# Patient Record
Sex: Female | Born: 1975 | Race: Black or African American | Hispanic: No | Smoking: Never smoker
Health system: Southern US, Community
[De-identification: ages and names within clinical notes are randomized; demographics above are authoritative.]

## PROBLEM LIST (undated history)

## (undated) DIAGNOSIS — E669 Obesity, unspecified: Secondary | ICD-10-CM

## (undated) DIAGNOSIS — I10 Essential (primary) hypertension: Secondary | ICD-10-CM

## (undated) DIAGNOSIS — T7840XA Allergy, unspecified, initial encounter: Secondary | ICD-10-CM

## (undated) DIAGNOSIS — H35039 Hypertensive retinopathy, unspecified eye: Secondary | ICD-10-CM

## (undated) DIAGNOSIS — D649 Anemia, unspecified: Secondary | ICD-10-CM

## (undated) HISTORY — DX: Allergy, unspecified, initial encounter: T78.40XA

## (undated) HISTORY — DX: Essential (primary) hypertension: I10

## (undated) HISTORY — DX: Hypertensive retinopathy, unspecified eye: H35.039

## (undated) HISTORY — DX: Obesity, unspecified: E66.9

---

## 2001-08-17 ENCOUNTER — Emergency Department (HOSPITAL_COMMUNITY): Admission: EM | Admit: 2001-08-17 | Discharge: 2001-08-17 | Payer: Self-pay | Admitting: Internal Medicine

## 2001-08-17 ENCOUNTER — Encounter: Payer: Self-pay | Admitting: Internal Medicine

## 2002-02-12 ENCOUNTER — Ambulatory Visit (HOSPITAL_COMMUNITY): Admission: AD | Admit: 2002-02-12 | Discharge: 2002-02-12 | Payer: Self-pay | Admitting: Obstetrics and Gynecology

## 2002-06-17 ENCOUNTER — Inpatient Hospital Stay (HOSPITAL_COMMUNITY): Admission: AD | Admit: 2002-06-17 | Discharge: 2002-06-20 | Payer: Self-pay | Admitting: Obstetrics & Gynecology

## 2003-10-08 ENCOUNTER — Emergency Department (HOSPITAL_COMMUNITY): Admission: EM | Admit: 2003-10-08 | Discharge: 2003-10-08 | Payer: Self-pay | Admitting: Emergency Medicine

## 2004-07-28 ENCOUNTER — Emergency Department (HOSPITAL_COMMUNITY): Admission: EM | Admit: 2004-07-28 | Discharge: 2004-07-28 | Payer: Self-pay | Admitting: Emergency Medicine

## 2004-10-03 ENCOUNTER — Emergency Department (HOSPITAL_COMMUNITY): Admission: EM | Admit: 2004-10-03 | Discharge: 2004-10-03 | Payer: Self-pay | Admitting: *Deleted

## 2005-02-19 ENCOUNTER — Emergency Department (HOSPITAL_COMMUNITY): Admission: EM | Admit: 2005-02-19 | Discharge: 2005-02-19 | Payer: Self-pay | Admitting: Family Medicine

## 2006-03-05 ENCOUNTER — Emergency Department (HOSPITAL_COMMUNITY): Admission: EM | Admit: 2006-03-05 | Discharge: 2006-03-05 | Payer: Self-pay | Admitting: Emergency Medicine

## 2006-08-05 ENCOUNTER — Ambulatory Visit: Payer: Self-pay | Admitting: Family Medicine

## 2006-08-06 ENCOUNTER — Encounter: Payer: Self-pay | Admitting: Family Medicine

## 2006-08-06 LAB — CONVERTED CEMR LAB
BUN: 7 mg/dL (ref 6–23)
Basophils Absolute: 0 10*3/uL (ref 0.0–0.1)
Basophils Relative: 0 % (ref 0–1)
CO2: 20 meq/L (ref 19–32)
Calcium: 9.2 mg/dL (ref 8.4–10.5)
Chloride: 105 meq/L (ref 96–112)
Cholesterol: 144 mg/dL (ref 0–200)
Creatinine, Ser: 0.74 mg/dL (ref 0.40–1.20)
Eosinophils Absolute: 0.1 10*3/uL (ref 0.0–0.7)
Eosinophils Relative: 1 % (ref 0–5)
Glucose, Bld: 93 mg/dL (ref 70–99)
HCT: 39.9 % (ref 36.0–46.0)
HDL: 53 mg/dL (ref >39)
Hemoglobin: 12.5 g/dL (ref 12.0–15.0)
LDL Cholesterol: 81 mg/dL (ref 0–99)
Lymphocytes Relative: 35 % (ref 12–46)
Lymphs Abs: 2.1 10*3/uL (ref 0.7–3.3)
MCHC: 31.3 g/dL (ref 30.0–36.0)
MCV: 89.7 fL (ref 78.0–100.0)
Monocytes Absolute: 0.4 10*3/uL (ref 0.2–0.7)
Monocytes Relative: 7 % (ref 3–11)
Neutro Abs: 3.5 10*3/uL (ref 1.7–7.7)
Neutrophils Relative %: 58 % (ref 43–77)
Platelets: 452 10*3/uL — ABNORMAL HIGH (ref 150–400)
Potassium: 4.5 meq/L (ref 3.5–5.3)
RBC: 4.45 M/uL (ref 3.87–5.11)
RDW: 12.9 % (ref 11.5–14.0)
Sodium: 139 meq/L (ref 135–145)
TSH: 1.769 microintl units/mL (ref 0.350–5.50)
Total CHOL/HDL Ratio: 2.7
Triglycerides: 48 mg/dL (ref <150)
VLDL: 10 mg/dL (ref 0–40)
WBC: 6 10*3/uL (ref 4.0–10.5)

## 2006-08-07 ENCOUNTER — Encounter: Payer: Self-pay | Admitting: Family Medicine

## 2006-08-07 LAB — CONVERTED CEMR LAB
Candida species: NEGATIVE
Chlamydia, DNA Probe: NEGATIVE
GC Probe Amp, Genital: NEGATIVE
Gardnerella vaginalis: POSITIVE — AB
Trichomonal Vaginitis: NEGATIVE

## 2006-08-30 ENCOUNTER — Ambulatory Visit: Payer: Self-pay | Admitting: Family Medicine

## 2007-01-12 ENCOUNTER — Ambulatory Visit: Payer: Self-pay | Admitting: Family Medicine

## 2007-01-12 ENCOUNTER — Other Ambulatory Visit: Admission: RE | Admit: 2007-01-12 | Discharge: 2007-01-12 | Payer: Self-pay | Admitting: Family Medicine

## 2007-01-12 ENCOUNTER — Encounter: Payer: Self-pay | Admitting: Family Medicine

## 2007-01-12 LAB — CONVERTED CEMR LAB: Pap Smear: NORMAL

## 2007-01-13 ENCOUNTER — Encounter: Payer: Self-pay | Admitting: Family Medicine

## 2007-01-13 LAB — CONVERTED CEMR LAB
Chlamydia, DNA Probe: NEGATIVE
GC Probe Amp, Genital: NEGATIVE

## 2007-01-14 ENCOUNTER — Encounter: Payer: Self-pay | Admitting: Family Medicine

## 2007-01-14 LAB — CONVERTED CEMR LAB
Candida species: NEGATIVE
Gardnerella vaginalis: POSITIVE — AB
Trichomonal Vaginitis: NEGATIVE

## 2007-01-28 ENCOUNTER — Ambulatory Visit (HOSPITAL_COMMUNITY): Admission: RE | Admit: 2007-01-28 | Discharge: 2007-01-28 | Payer: Self-pay | Admitting: Orthopaedic Surgery

## 2007-05-05 ENCOUNTER — Encounter: Payer: Self-pay | Admitting: Family Medicine

## 2007-10-12 ENCOUNTER — Ambulatory Visit: Payer: Self-pay | Admitting: Family Medicine

## 2007-10-12 LAB — CONVERTED CEMR LAB
Chlamydia, DNA Probe: NEGATIVE
GC Probe Amp, Genital: NEGATIVE

## 2007-10-13 ENCOUNTER — Encounter: Payer: Self-pay | Admitting: Family Medicine

## 2007-10-13 LAB — CONVERTED CEMR LAB
Candida species: NEGATIVE
Gardnerella vaginalis: POSITIVE — AB
Trichomonal Vaginitis: NEGATIVE

## 2007-10-17 ENCOUNTER — Encounter: Payer: Self-pay | Admitting: Family Medicine

## 2007-11-03 ENCOUNTER — Ambulatory Visit: Payer: Self-pay | Admitting: Family Medicine

## 2008-01-30 ENCOUNTER — Telehealth: Payer: Self-pay | Admitting: Family Medicine

## 2008-02-28 ENCOUNTER — Encounter: Payer: Self-pay | Admitting: Family Medicine

## 2008-03-08 ENCOUNTER — Encounter: Payer: Self-pay | Admitting: Family Medicine

## 2008-03-19 ENCOUNTER — Other Ambulatory Visit: Admission: RE | Admit: 2008-03-19 | Discharge: 2008-03-19 | Payer: Self-pay | Admitting: Family Medicine

## 2008-03-19 ENCOUNTER — Encounter: Payer: Self-pay | Admitting: Family Medicine

## 2008-03-19 ENCOUNTER — Ambulatory Visit: Payer: Self-pay | Admitting: Family Medicine

## 2008-03-20 ENCOUNTER — Encounter: Payer: Self-pay | Admitting: Family Medicine

## 2008-03-20 LAB — CONVERTED CEMR LAB
Candida species: NEGATIVE
Chlamydia, DNA Probe: POSITIVE — AB
GC Probe Amp, Genital: NEGATIVE
Gardnerella vaginalis: NEGATIVE
Trichomonal Vaginitis: NEGATIVE

## 2008-03-21 DIAGNOSIS — N760 Acute vaginitis: Secondary | ICD-10-CM | POA: Insufficient documentation

## 2008-03-26 ENCOUNTER — Encounter: Payer: Self-pay | Admitting: Family Medicine

## 2008-05-02 ENCOUNTER — Telehealth: Payer: Self-pay | Admitting: Family Medicine

## 2008-06-26 ENCOUNTER — Telehealth: Payer: Self-pay | Admitting: Family Medicine

## 2008-06-27 ENCOUNTER — Encounter: Payer: Self-pay | Admitting: Family Medicine

## 2008-07-19 ENCOUNTER — Ambulatory Visit: Payer: Self-pay | Admitting: Family Medicine

## 2008-07-20 ENCOUNTER — Encounter: Payer: Self-pay | Admitting: Family Medicine

## 2008-07-20 LAB — CONVERTED CEMR LAB
Candida species: NEGATIVE
Chlamydia, DNA Probe: NEGATIVE
GC Probe Amp, Genital: NEGATIVE
Gardnerella vaginalis: POSITIVE — AB
Trichomonal Vaginitis: NEGATIVE

## 2008-11-09 ENCOUNTER — Ambulatory Visit: Payer: Self-pay | Admitting: Family Medicine

## 2008-11-09 DIAGNOSIS — N938 Other specified abnormal uterine and vaginal bleeding: Secondary | ICD-10-CM | POA: Insufficient documentation

## 2008-11-09 DIAGNOSIS — N949 Unspecified condition associated with female genital organs and menstrual cycle: Secondary | ICD-10-CM

## 2008-11-09 LAB — CONVERTED CEMR LAB
Beta hcg, urine, semiquantitative: NEGATIVE
Chlamydia, DNA Probe: NEGATIVE
GC Probe Amp, Genital: NEGATIVE

## 2008-11-10 ENCOUNTER — Encounter: Payer: Self-pay | Admitting: Family Medicine

## 2008-11-12 LAB — CONVERTED CEMR LAB
Candida species: NEGATIVE
Gardnerella vaginalis: POSITIVE — AB

## 2009-01-17 ENCOUNTER — Ambulatory Visit: Payer: Self-pay | Admitting: Family Medicine

## 2009-01-17 DIAGNOSIS — R5383 Other fatigue: Secondary | ICD-10-CM

## 2009-01-17 DIAGNOSIS — R5381 Other malaise: Secondary | ICD-10-CM | POA: Insufficient documentation

## 2009-01-18 ENCOUNTER — Encounter: Payer: Self-pay | Admitting: Family Medicine

## 2009-01-18 LAB — CONVERTED CEMR LAB
Candida species: POSITIVE — AB
Chlamydia, DNA Probe: NEGATIVE
GC Probe Amp, Genital: NEGATIVE
Gardnerella vaginalis: NEGATIVE

## 2009-01-22 LAB — CONVERTED CEMR LAB
BUN: 8 mg/dL
Basophils Absolute: 0 10*3/uL
Basophils Relative: 0 %
CO2: 24 meq/L
Calcium: 9.1 mg/dL
Chloride: 102 meq/L
Cholesterol: 158 mg/dL
Creatinine, Ser: 0.68 mg/dL
Eosinophils Absolute: 0.1 10*3/uL
Eosinophils Relative: 1 %
Glucose, Bld: 86 mg/dL
HCT: 41.1 %
HDL: 71 mg/dL
Hemoglobin: 12.9 g/dL
LDL Cholesterol: 76 mg/dL
Lymphocytes Relative: 36 %
Lymphs Abs: 2.9 10*3/uL
MCHC: 31.4 g/dL
MCV: 89.2 fL
Monocytes Absolute: 0.5 10*3/uL
Monocytes Relative: 6 %
Neutro Abs: 4.5 10*3/uL
Neutrophils Relative %: 56 %
Platelets: 427 10*3/uL — ABNORMAL HIGH
Potassium: 4.3 meq/L
RBC: 4.61 M/uL
RDW: 13.2 %
Sodium: 139 meq/L
TSH: 1.949 u[IU]/mL
Total CHOL/HDL Ratio: 2.2
Triglycerides: 54 mg/dL
VLDL: 11 mg/dL
WBC: 8 10*3/uL

## 2009-02-14 ENCOUNTER — Ambulatory Visit: Payer: Self-pay | Admitting: Family Medicine

## 2009-02-14 LAB — CONVERTED CEMR LAB: Beta hcg, urine, semiquantitative: POSITIVE

## 2009-02-15 LAB — CONVERTED CEMR LAB: hCG, Beta Chain, Quant, S: 1403.8 milliintl units/mL

## 2009-02-16 DIAGNOSIS — N912 Amenorrhea, unspecified: Secondary | ICD-10-CM | POA: Insufficient documentation

## 2009-02-18 ENCOUNTER — Telehealth: Payer: Self-pay | Admitting: Family Medicine

## 2009-03-25 ENCOUNTER — Other Ambulatory Visit: Admission: RE | Admit: 2009-03-25 | Discharge: 2009-03-25 | Payer: Self-pay | Admitting: Obstetrics and Gynecology

## 2009-10-04 ENCOUNTER — Ambulatory Visit: Payer: Self-pay | Admitting: Advanced Practice Midwife

## 2009-10-04 ENCOUNTER — Inpatient Hospital Stay (HOSPITAL_COMMUNITY): Admission: AD | Admit: 2009-10-04 | Discharge: 2009-10-07 | Payer: Self-pay | Admitting: Obstetrics & Gynecology

## 2009-12-04 ENCOUNTER — Telehealth: Payer: Self-pay | Admitting: Family Medicine

## 2009-12-04 ENCOUNTER — Ambulatory Visit: Payer: Self-pay | Admitting: Family Medicine

## 2009-12-04 DIAGNOSIS — I10 Essential (primary) hypertension: Secondary | ICD-10-CM | POA: Insufficient documentation

## 2009-12-20 ENCOUNTER — Emergency Department (HOSPITAL_COMMUNITY): Admission: EM | Admit: 2009-12-20 | Discharge: 2009-12-20 | Payer: Self-pay | Admitting: Emergency Medicine

## 2010-03-21 ENCOUNTER — Ambulatory Visit: Payer: Self-pay | Admitting: Family Medicine

## 2010-06-03 NOTE — Progress Notes (Signed)
Summary: RX  Phone Note Call from Patient   Summary of Call: ON HER BIRTH CONTROL RX ONLY WAS TWO 2 REFILLS AND WANTS TO KNOW WHY NOT 12 MONTHS  U CAN SEND OVER FOR 12 MONTHS AT [facility] CALL BACK AT [phone] Initial call taken by: Lind Guest,  May 02, 2008 4:02 PM  Follow-up for Phone Call        Rx Called In Follow-up by: Worthy Keeler LPN,  May 02, 2008 4:56 PM      Prescriptions: ORTHO-NOVUM 7/7/7 (28) 0.5/0.75/1-35 MG-MCG TABS (NORETHIN-ETH ESTRAD TRIPHASIC) Take one tab by mouth once daily  #1 cycle x 11   Entered by:   Worthy Keeler LPN   Authorized by:   Syliva Overman MD   Signed by:   Worthy Keeler LPN on 25/42/7062   Method used:   Electronically to        [facility]* (retail)       [address]       [place], [place]  [address]       Ph: [phone]       Fax: [phone]   RxID:   [id number]

## 2010-06-03 NOTE — Letter (Signed)
Summary: 1st missed appoinment  1st missed appoinment   Imported By: Lind Guest 03/21/2010 14:40:22  _____________________________________________________________________  External Attachment:    Type:   Image     Comment:   External Document

## 2010-06-03 NOTE — Letter (Signed)
Summary: misc  misc   Imported By: Lind Guest 10/03/2009 08:12:46  _____________________________________________________________________  External Attachment:    Type:   Image     Comment:   External Document

## 2010-06-03 NOTE — Assessment & Plan Note (Signed)
Summary: PHY   Vital Signs:  Patient Profile:   35 Years Old Female Height:     64 inches (162.56 cm) Weight:      246 pounds BMI:     42.38 O2 Sat:      97 % Pulse rate:   99 / minute Pulse rhythm:   regular Resp:     16 per minute BP sitting:   120 / 88  (left arm)  Pt. in pain?   no  Vitals Entered By: Everitt Amber (March 19, 2008 10:08 AM)               Vision Screening: Left eye with correction: 20 / 15 Right eye with correction: 20 / 15 Both eyes with correction: 20 / 20  Color vision testing: normal      Vision Entered By: Everitt Amber (March 19, 2008 10:14 AM)   Chief Complaint:  Follow up and Physical.  History of Present Illness: Pt. reports that she is well. Her concerns are that of being obese and struggling with weight loss unsuccessfully, and the fact that she does not entirely trust her partener. She denies fever or chills. She denies depression or anxiety.    Current Allergies: No known allergies      Review of Systems  ENT      Denies earache, hoarseness, nasal congestion, sinus pressure, and sore throat.  CV      Denies chest pain or discomfort, palpitations, shortness of breath with exertion, and swelling of feet.  Resp      Denies cough, sputum productive, and wheezing.  GI      Denies abdominal pain, constipation, diarrhea, nausea, and vomiting.  GU      Complains of discharge.      Denies abnormal vaginal bleeding, nocturia, and urinary frequency.  MS      Denies joint pain, low back pain, mid back pain, and stiffness.  Psych      Denies anxiety, depression, irritability, and suicidal thoughts/plans.  Endo      Denies cold intolerance, excessive hunger, excessive thirst, excessive urination, heat intolerance, polyuria, and weight change.  Heme      Denies abnormal bruising, bleeding, enlarge lymph nodes, fevers, pallor, and skin discoloration.  Allergy      Complains of seasonal allergies.   Physical Exam   General:     morbidly obese Head:     Normocephalic and atraumatic without obvious abnormalities. No apparent alopecia or balding. Eyes:     vision grossly intact, pupils equal, pupils round, and pupils reactive to light.   Ears:     External ear exam shows no significant lesions or deformities.  Otoscopic examination reveals clear canals, tympanic membranes are intact bilaterally without bulging, retraction, inflammation or discharge. Hearing is grossly normal bilaterally. Nose:     External nasal examination shows no deformity or inflammation. Nasal mucosa are pink and moist without lesions or exudates. Mouth:     Oral mucosa and oropharynx without lesions or exudates.  Teeth in good repair. Neck:     No deformities, masses, or tenderness noted. Chest Wall:     No deformities, masses, or tenderness noted. Breasts:     No mass, nodules, thickening, tenderness, bulging, retraction, inflamation, nipple discharge or skin changes noted.   Lungs:     Normal respiratory effort, chest expands symmetrically. Lungs are clear to auscultation, no crackles or wheezes. Heart:     Normal rate and regular rhythm. S1 and S2 normal without  gallop, murmur, click, rub or other extra sounds. Abdomen:     Bowel sounds positive,abdomen soft and non-tender without masses, organomegaly or hernias noted. Genitalia:     Normal introitus for age, no external lesions, no vaginal discharge, mucosa pink and moist,positive vaginal or cervical lesions, no vaginal atrophy, no friaility or hemorrhage, normal uterus size and position, no adnexal masses or tenderness Msk:     No deformity or scoliosis noted of thoracic or lumbar spine.   Pulses:     R and L carotid,radial,femoral,dorsalis pedis and posterior tibial pulses are full and equal bilaterally Extremities:     No clubbing, cyanosis, edema, or deformity noted with normal full range of motion of all joints.   Neurologic:     No cranial nerve deficits noted.  Station and gait are normal. Plantar reflexes are down-going bilaterally. DTRs are symmetrical throughout. Sensory, motor and coordinative functions appear intact. Skin:     Intact without suspicious lesions or rashes Cervical Nodes:     No lymphadenopathy noted Axillary Nodes:     No palpable lymphadenopathy Inguinal Nodes:     No significant adenopathy Psych:     Cognition and judgment appear intact. Alert and cooperative with normal attention span and concentration. No apparent delusions, illusions, hallucinations    Impression & Recommendations:  Problem # 1:  WELL WOMAN (ICD-V70.0) Assessment: Comment Only pap sent. Safe sex habits and consistent use of seat belts was discussed  Problem # 2:  OBESITY (ICD-278.00) Assessment: Unchanged  Orders: T-Basic Metabolic Panel [phone]) T-Lipid Profile [phone]) Pt counselled for 7 mins re necessary lifestyle changes to facilitate weight loss. No interest now in apetite suppressant    Problem # 3:  VAGINITIS&VULVOVAGINITIS DISEASES CLASS ELSW (ONG-295.28) Assessment: Comment Only specimen sent for wet prep and culture  Complete Medication List: 1)  Ortho-novum 7/7/7 (28) 0.5/0.75/1-35 Mg-mcg Tabs (Norethin-eth estrad triphasic) .... Take one tab by mouth once daily 2)  Doxycycline Hyclate 100 Mg Caps (Doxycycline hyclate) .... One tab by mouth bid  Other Orders: Pap Smear ([id number]) T-CBC w/Diff ([id number]) T-TSH ([id number])   Patient Instructions: 1)  Please schedule a follow-up appointment in 3 months. 2)  It is important that you exercise regularly at least 20 minutes 5 times a week. If you develop chest pain, have severe difficulty breathing, or feel very tired , stop exercising immediately and seek medical attention. You need to start going to the [facility] at leasst 5 days per week. 3)  You need to lose weight. Consider a lower calorie diet and regular exercise.  4)  Target 10 pound weight loss. 5)  BMP prior to  visit, ICD-9: 6)  Lipid Panel prior to visit, ICD-9:  as soon as possible 7)  TSH prior to visit, ICD-9: 8)  CBC w/ Diff prior to visit, ICD-9:   ]

## 2010-06-03 NOTE — Assessment & Plan Note (Signed)
Summary: office visit   Vital Signs:  Patient profile:   35 year old female Menstrual status:  regular Height:      64 inches Weight:      256.38 pounds BMI:     44.17 Pulse rate:   90 / minute Pulse rhythm:   regular Resp:     16 per minute BP sitting:   120 / 70  (left arm)  Vitals Entered By: Worthy Keeler LPN (February 14, 2009 9:32 AM) CC: positive home pregnancy test Is Patient Diabetic? No Pain Assessment Patient in pain? no        CC:  positive home pregnancy test.  History of Present Illness: Pt had her LMP August 2010, middle, she was reportedly on orthonovum and still conceived. Huntley Dec she is happy about her pregnacy. She denies nausea or vomitting. she ahs discontinued all meds including phentermine when she found out that shewas pregnant, I told her this was great, butthat she needas to follow a heaklthy diet rich in fruit and veg to prevent excessive weight gain and facilitate a healthy pregnancy.  Current Medications (verified): 1)  None  Allergies: No Known Drug Allergies  Review of Systems      See HPI General:  Denies chills and fever. ENT:  Denies earache, hoarseness, nasal congestion, and sinus pressure. CV:  Denies chest pain or discomfort, palpitations, and swelling of feet. Resp:  Denies cough, sputum productive, and wheezing. GI:  Denies abdominal pain, constipation, diarrhea, indigestion, nausea, and vomiting. GU:  Denies discharge, dysuria, and urinary frequency. MS:  Denies joint pain and stiffness. Psych:  Denies anxiety and depression.  Physical Exam  General:  alert, well-hydrated, and overweight-appearing.  HEENT: No facial asymmetry,  EOMI, No sinus tenderness, TM's Clear, oropharynx  pink and moist.   Chest: Clear to auscultation bilaterally.  CVS: S1, S2, No murmurs, No S3.   Abd: Soft, Nontender.  MS: Adequate ROM spine, hips, shoulders and knees.  Ext: No edema.   CNS: CN 2-12 intact, power tone and sensation normal  throughout.   Skin: Intact, no visible lesions or rashes.  Psych: Good eye contact, normal affect.  Memory intact, not anxious or depressed appearing. pelvic: cream maodoros discharge, pt also on menses at the time of exam    Impression & Recommendations:  Problem # 1:  AMENORRHEA (ICD-626.0) Assessment Comment Only  The following medications were removed from the medication list:    Ortho-novum 7/7/7 (28) 0.5/0.75/1-35 Mg-mcg Tabs (Norethin-eth estrad triphasic) .Marland Kitchen... Take one tab by mouth once daily  Orders: Urine Pregnancy Test  (81025)positive T-Pregnancy (Serum), Quant. [phone]) Obstetric Referral (Obstetric)  Problem # 2:  OBESITY (ICD-278.00) Assessment: Comment Only  Orders: Obstetric Referral (Obstetric)  Ht: 64 (02/14/2009)   Wt: 256.38 (02/14/2009)   BMI: 44.17 (02/14/2009)  Complete Medication List: 1)  Prenatal Vitamins 0.8 Mg Tabs (Prenatal multivit-min-fe-fa) .... Take 1 tablet by mouth once a day  Other Orders: T-RPR (Syphilis) ([id number]) T-HIV Antibody  (Reflex) ([id number])  Patient Instructions: 1)  You will be referred to [facility] asap . 2)  Quantitative HCG today , 3)  Pls start one prenatal vitamion daily. 4)  It is important that you exercise regularly at least 30 minutes 6 times a week. If you develop chest pain, have severe difficulty breathing, or feel very tired , stop exercising immediately and seek medical attention. 5)  You need to lose weight. Consider a lower calorie diet and regular exercise.  6)  Cobngrats, and all the Best!! Prescriptions:  PRENATAL VITAMINS 0.8 MG TABS (PRENATAL MULTIVIT-MIN-FE-FA) Take 1 tablet by mouth once a day  #30 x 10   Entered and Authorized by:   Syliva Overman MD   Signed by:   Syliva Overman MD on 02/16/2009   Method used:   Handwritten   RxID:   [id number]   Laboratory Results   Urine Tests  Date/Time Received: 02/14/09 9:45am Date/Time Reported: 02/14/09 9:45am    Urine HCG:  positive

## 2010-06-03 NOTE — Assessment & Plan Note (Signed)
Summary: phy   Vital Signs:  Patient Profile:   35 Years Old Female Height:     64 inches (162.56 cm) Weight:      246 pounds (111.82 kg) BMI:     42.38 BSA:     2.14 O2 Sat:      97 % Pulse rate:   103 / minute Resp:     16 per minute BP sitting:   120 / 90  (left arm)  Pt. in pain?   no  Vitals Entered By: Everitt Amber (February 28, 2008 3:03 PM)               Vision Screening: Left eye with correction: 20 / 15 Right eye with correction: 20 / 20 Both eyes with correction: 20 / 15  Color vision testing: normal      Vision Entered By: Everitt Amber (February 28, 2008 3:08 PM)   Chief Complaint:  Follow up and Physical.  History of Present Illness: Pt is essentially doing well and her only health concern is her weight.She is not diligent in weight loss attempts and therefore hs had no success with this. She denies depression or anxiety. She has had no recent fever or chills.    Updated Prior Medication List: No Medications Current Allergies: No known allergies           ]Pt. rescheduled secondary to being on her menses  Appended Document: phy please cancel charge for this visit, the pt resched she was on her menses, she did not have an exam on this date  Appended Document: phy charge has been removed

## 2010-06-03 NOTE — Letter (Signed)
Summary: office notes  office notes   Imported By: Lind Guest 10/03/2009 08:15:36  _____________________________________________________________________  External Attachment:    Type:   Image     Comment:   External Document

## 2010-06-03 NOTE — Assessment & Plan Note (Signed)
Summary: OV   Vital Signs:  Patient profile:   35 year old female Menstrual status:  regular Height:      64 inches (162.56 cm) Weight:      247 pounds (112.27 kg) BMI:     42.55 BSA:     2.14 O2 Sat:      96 % Pulse rate:   100 / minute Resp:     16 per minute BP sitting:   120 / 90  (left arm)  Vitals Entered By: Everitt Amber (July 19, 2008 10:36 AM)  Nutrition Counseling: Patient's BMI is greater than 25 and therefore counseled on weight management options. CC: Follow up  years   days  Menstrual Status regular Last PAP Result NEGATIVE FOR INTRAEPITHELIAL LESIONS OR MALIGNANCY.   CC:  Follow up.  History of Present Illness: 2 week h/o increased vaginal discharge. wants to be rechecked  for chlamydia, does not think her partener was treated.she denies dyspareunia or post coital bleed. The pt is also concerned about her inability to lose weight, she is inconsitent in lifestyle change neede, and is requesting help with an apetite suppressant. she denies depreeion, anxiety or insomnia.  Preventive Screening-Counseling & Management     Smoking Status: never  Allergies (verified): No Known Drug Allergies  Review of Systems General:  Denies chills and fever. Eyes:  Denies blurring, discharge, and itching. ENT:  Denies earache, hoarseness, nasal congestion, and sinus pressure. CV:  Denies chest pain or discomfort, palpitations, and swelling of feet. Resp:  Denies cough, sputum productive, and wheezing. GI:  Denies abdominal pain, constipation, diarrhea, indigestion, nausea, and vomiting. GU:  See HPI. MS:  Denies joint pain and stiffness. Derm:  Denies itching, lesion(s), and rash. Psych:  Denies anxiety and depression. Endo:  Denies cold intolerance, excessive hunger, excessive thirst, excessive urination, heat intolerance, polyuria, and weight change.  Physical Exam  General:  alert, well-hydrated, and overweight-appearing. HEENT: No facial asymmetry,  EOMI, No  sinus tenderness, TM's Clear, oropharynx  pink and moist.   Chest: Clear to auscultation bilaterally.  CVS: S1, S2, No murmurs, No S3.   Abd: Soft, Nontender.  MS: Adequate ROM spine, hips, shoulders and knees.  Ext: No edema.   CNS: CN 2-12 intact, power tone and sensation normal throughout.   Skin: Intact, no visible lesions or rashes.  Psych: Good eye contact, normal affect.  Memory intact, not anxious or depressed appearing. Pelvic: Malosoros vag d/c , no cervical motion or adnexal tenderness, uterus nl sized.     Impression & Recommendations:  Problem # 1:  VAGINITIS&VULVOVAGINITIS DISEASES CLASS ELSW (ZOX-096.04) Assessment Comment Only  Orders: T-Wet Prep by Molecular Probe [phone]) T-Chlamydia & GC Probe, Genital ([id number])  Problem # 2:  OBESITY (ICD-278.00) Assessment: Deteriorated start phentermine half daily  Complete Medication List: 1)  Ortho-novum 7/7/7 (28) 0.5/0.75/1-35 Mg-mcg Tabs (Norethin-eth estrad triphasic) .... Take one tab by mouth once daily 2)  Doxycycline Hyclate 100 Mg Caps (Doxycycline hyclate) .... One tab by mouth bid 3)  Phentermine Hcl 37.5 Mg Tabs (Phentermine hcl) .... Take 1 tablet by mouth once a day 4)  Metronidazole 500 Mg Tabs (Metronidazole) .... One tab by mouth bid  Patient Instructions: 1)  Please schedule a follow-up appointment in 2 months. 2)  It is important that you exercise regularly at least 40 minutes 6 times a week. If you develop chest pain, have severe difficulty breathing, or feel very tired , stop exercising immediately and seek medical attention. 3)  You  need to lose weight. Consider a lower calorie diet and regular exercise.  4)  medication issent in to assist with weight loss. 5)  specimens  are being sent for testing. Prescriptions: PHENTERMINE HCL 37.5 MG TABS (PHENTERMINE HCL) Take 1 tablet by mouth once a day  #30 x 0   Entered and Authorized by:   Syliva Overman MD   Signed by:   Syliva Overman  MD on 07/19/2008   Method used:   Printed then faxed to ...       [facility]* (retail)       [address]       [place], [place]  [address]       Ph: [phone]       Fax: [phone]   RxID:   [id number]

## 2010-06-03 NOTE — Miscellaneous (Signed)
Summary: refill  Clinical Lists Changes  Medications: Rx of ORTHO-NOVUM 7/7/7 (28) 0.5/0.75/1-35 MG-MCG TABS (NORETHIN-ETH ESTRAD TRIPHASIC) Take one tab by mouth once daily;  #1 month x 2;  Signed;  Entered by: Worthy Keeler LPN;  Authorized by: Syliva Overman MD;  Method used: Electronically to [facility]*, [address], [place], [place], [place]  [address], Ph: [phone], Fax: [phone]    Prescriptions: ORTHO-NOVUM 7/7/7 (28) 0.5/0.75/1-35 MG-MCG TABS (NORETHIN-ETH ESTRAD TRIPHASIC) Take one tab by mouth once daily  #1 month x 2   Entered by:   Worthy Keeler LPN   Authorized by:   Syliva Overman MD   Signed by:   Worthy Keeler LPN on 24/40/1027   Method used:   Electronically to        [facility]* (retail)       [address]       [place], [place]  [address]       Ph: [phone]       Fax: [phone]   RxID:   [id number]

## 2010-06-03 NOTE — Progress Notes (Signed)
Summary: NEEDS A REFERRALL  Phone Note Call from Patient   Summary of Call: NEEDS A  REFFERRAL TO DR. Weldon Inches DR. COUSINS NEED [phone]  CALL BACK AT [phone] Initial call taken by: Lind Guest,  February 18, 2009 2:38 PM  Follow-up for Phone Call        pls refer pt to dr. Beola Cord for pregnancy and send quant hCG result as well as her hIV test she recently had done , pls let her know  you are working on it/pass on if unable to do today  Follow-up by: Syliva Overman MD,  February 18, 2009 4:20 PM  Additional Follow-up for Phone Call Additional follow up Details #1::        Phone Call Completed Additional Follow-up by: Worthy Keeler LPN,  February 18, 2009 5:08 PM

## 2010-06-03 NOTE — Letter (Signed)
Summary: phone notes  phone notes   Imported By: Lind Guest 10/03/2009 08:13:52  _____________________________________________________________________  External Attachment:    Type:   Image     Comment:   External Document

## 2010-06-03 NOTE — Progress Notes (Signed)
Summary: please advise  Phone Note Call from Patient   Summary of Call: patient called in and wanted to know about prescription, she states the one that was called in is no longer avaliable.  Please advise. Initial call taken by: Curtis Sites,  December 04, 2009 1:49 PM    Prescriptions: TRIAMTERENE-HCTZ 37.5-25 MG TABS (TRIAMTERENE-HCTZ) Take 1 tablet by mouth once a day  #90 x 0   Entered by:   Everitt Amber LPN   Authorized by:   Syliva Overman MD   Signed by:   Everitt Amber LPN on 16/02/9603   Method used:   Electronically to        [facility]* (retail)       [address]       [address] Shipping/mailing       [place], [place]  [address]       Ph: [phone]       Fax: [phone]   RxID:   [id number]  The original strength wasn't available so 37.5/25 was sent in. Patient called and said that she was in [place] and to resend it to [place] [facility] out pt.

## 2010-06-03 NOTE — Letter (Signed)
Summary: labs  labs   Imported By: Lind Guest 10/03/2009 08:12:16  _____________________________________________________________________  External Attachment:    Type:   Image     Comment:   External Document

## 2010-06-03 NOTE — Assessment & Plan Note (Signed)
Summary: office visit   Vital Signs:  Patient profile:   35 year old female Menstrual status:  regular Height:      64 inches Weight:      241.50 pounds BMI:     41.60 O2 Sat:      98 % Pulse rate:   103 / minute Pulse rhythm:   regular Resp:     16 per minute BP sitting:   160 / 110  (left arm) Cuff size:   large  Vitals Entered By: Everitt Amber LPN (December 04, 2009 10:51 AM)  Nutrition Counseling: Patient's BMI is greater than 25 and therefore counseled on weight management options. CC: BP still high after pregnancy   CC:  BP still high after pregnancy.  History of Present Illness: Reports  thatshe has been oing well. Since her last visit, she developed a healthy infant girl who is now 21 months old. the pt had HTN post partum, felt to be pregnancy relate, but this has persisted,a nd she states she does not feel wellin the past 2 days, abit light headed Denies recent fever or chills. Denies sinus pressure, nasal congestion , ear pain or sore throat. Denies chest congestion, or cough productive of sputum. Denies chest pain, palpitations, PND, orthopnea or leg swelling. Denies abdominal pain, nausea, vomitting, diarrhea or constipation. Denies change in bowel movements or bloody stool. Denies dysuria , frequency, incontinence or hesitancy. Denies  joint pain, swelling, or reduced mobility. Denies headaches, vertigo, seizures. Denies depression, anxiety or insomnia. Denies  rash, lesions, or itch.     Current Medications (verified): 1)  None  Allergies (verified): No Known Drug Allergies  Past History:  Past medical, surgical, family and social histories (including risk factors) reviewed, and no changes noted (except as noted below).  Past Medical History:   OBESITY (ICD-278.00) hTN 2011  Past Surgical History: Reviewed history from 10/17/2007 and no changes required. Denies surgical history  Family History: Reviewed history from 10/17/2007 and no changes  required. Father: HTN,DM Mother: HTN Siblings: one brother, tobbacco abuse  Social History: Reviewed history from 10/17/2007 and no changes required. Marital Status: Single  Children: two ages 2 months and 7 years (12/2009) Occupation: CNA Never Smoked Alcohol use-no Drug use-no  Review of Systems      See HPI General:  Complains of fatigue. Eyes:  Denies blurring, discharge, eye pain, and red eye. Neuro:  Denies headaches and seizures. Endo:  Denies cold intolerance, excessive hunger, excessive thirst, excessive urination, heat intolerance, polyuria, and weight change. Heme:  Denies abnormal bruising and bleeding. Allergy:  Denies hives or rash and itching eyes.  Physical Exam  General:  Well-developed,obese,in no acute distress; alert,appropriate and cooperative throughout examination HEENT: No facial asymmetry,  EOMI, No sinus tenderness, TM's Clear, oropharynx  pink and moist.   Chest: Clear to auscultation bilaterally.  CVS: S1, S2, No murmurs, No S3.   Abd: Soft, Nontender.  MS: Adequate ROM spine, hips, shoulders and knees.  Ext: No edema.   CNS: CN 2-12 intact, power tone and sensation normal throughout.   Skin: Intact, no visible lesions or rashes.  Psych: Good eye contact, normal affect.  Memory intact, not anxious or depressed appearing.    Impression & Recommendations:  Problem # 1:  ESSENTIAL HYPERTENSION (ICD-401.9) Assessment Deteriorated  Her updated medication list for this problem includes:    Triamterene-hctz 50-25 Mg Caps (Triamterene-hctz) .Marland Kitchen... Take 1 capsule by mouth once a day    Triamterene-hctz 37.5-25 Mg Tabs (Triamterene-hctz) .Marland KitchenMarland KitchenMarland KitchenMarland Kitchen  Take 1 tablet by mouth once a day  BP today: 160/110 Prior BP: 120/70 (02/14/2009)  Labs Reviewed: K+: 4.3 (01/18/2009) Creat: : 0.68 (01/18/2009)   Chol: 158 (01/18/2009)   HDL: 71 (01/18/2009)   LDL: 76 (01/18/2009)   TG: 54 (01/18/2009)  Problem # 2:  OBESITY (ICD-278.00) Assessment: Improved  Ht: 64  (12/04/2009)   Wt: 241.50 (12/04/2009)   BMI: 41.60 (12/04/2009)  Complete Medication List: 1)  Triamterene-hctz 50-25 Mg Caps (Triamterene-hctz) .... Take 1 capsule by mouth once a day 2)  Triamterene-hctz 37.5-25 Mg Tabs (Triamterene-hctz) .... Take 1 tablet by mouth once a day  Other Orders: T-Basic Metabolic Panel [id number])  Patient Instructions: 1)  Please schedule a follow-up appointment in 1 month. 2)  It is important that you exercise regularly at least 30 minutes 5 times a week. If you develop chest pain, have severe difficulty breathing, or feel very tired , stop exercising immediately and seek medical attention. 3)  You need to lose weight. Consider a lower calorie diet and regular exercise.  4)  pLS follow the DASH diet. 5)  Med is to be started today 6)  BMP prior to visit, ICD-9: fasting in 4 weeks Prescriptions: TRIAMTERENE-HCTZ 37.5-25 MG TABS (TRIAMTERENE-HCTZ) Take 1 tablet by mouth once a day  #30 x 2   Entered by:   Everitt Amber LPN   Authorized by:   Syliva Overman MD   Signed by:   Everitt Amber LPN on 09/81/1914   Method used:   Electronically to        [facility]* (retail)       [address] [place], [place]  [address]       Ph: [phone]       Fax: [phone]   RxID:   [id number] TRIAMTERENE-HCTZ 50-25 MG CAPS (TRIAMTERENE-HCTZ) Take 1 capsule by mouth once a day  #90 x 0   Entered and Authorized by:   Syliva Overman MD   Signed by:   Syliva Overman MD on 12/04/2009   Method used:   Electronically to        [facility]* (retail)       [address]       [address] Shipping/mailing       [place], [place]  [address]       Ph: [phone]       Fax: [phone]   RxID:   [id number] TRIAMTERENE-HCTZ 50-25 MG CAPS (TRIAMTERENE-HCTZ) Take 1 capsule by mouth once a day  #30 x 2   Entered and Authorized by:   Syliva Overman MD   Signed by:   Syliva Overman MD on  12/04/2009   Method used:   Electronically to        [facility]* (retail)       [address]       [place], [place]  [address]       Ph: [phone]       Fax: [phone]   RxID:   [id number]

## 2010-06-03 NOTE — Progress Notes (Signed)
Summary: support hose  Phone Note Call from Patient   Summary of Call: has really bad veins in legs and thigh. would like to get some support hose. [facility] [phone] Initial call taken by: Rudene Anda,  June 26, 2008 9:27 AM  Follow-up for Phone Call        advise and fax script for medium compression hose 15mm Follow-up by: Syliva Overman MD,  June 26, 2008 4:03 PM  Additional Follow-up for Phone Call Additional follow up Details #1::        order sent to [place], called patient, no answer Additional Follow-up by: Worthy Keeler LPN,  June 26, 2008 4:29 PM    Additional Follow-up for Phone Call Additional follow up Details #2::    called patient, left message Follow-up by: Worthy Keeler LPN,  June 27, 2008 10:29 AM

## 2010-06-03 NOTE — Medication Information (Signed)
Summary: Tax adviser   Imported By: Lind Guest 06/27/2008 13:42:24  _____________________________________________________________________  External Attachment:    Type:   Image     Comment:   External Document

## 2010-06-03 NOTE — Assessment & Plan Note (Signed)
Summary: OV   Vital Signs:  Patient profile:   35 year old female Menstrual status:  regular LMP:     10/21/2008 Height:      64 inches Weight:      253.19 pounds BMI:     43.62 Pulse rate:   104 / minute Pulse rhythm:   regular Resp:     16 per minute BP sitting:   112 / 80  (left arm)  Vitals Entered By: Worthy Keeler LPN (November 09, 9145 10:50 AM)  Nutrition Counseling: Patient's BMI is greater than 25 and therefore counseled on weight management options. CC: follow-up visit Is Patient Diabetic? No Pain Assessment Patient in pain? no      LMP (date): 10/21/2008  years   days  Enter LMP: 10/21/2008 Last PAP Result NEGATIVE FOR INTRAEPITHELIAL LESIONS OR MALIGNANCY.   CC:  follow-up visit.  History of Present Illness: 1 week h/o malodoros vaginal discharge. She also reports unprotected intercourse when she ran out of her pills, does not desire pregnancy, and wants to know when can she resume a new pack. She didi not take the phentermine out of fear,she has not changed her habits for the better to a state where she    has started losing weight, shehas actually unfortunately gained weightsince her last visit.  Current Medications (verified): 1)  Ortho-Novum 7/7/7 (28) 0.5/0.75/1-35 Mg-Mcg Tabs (Norethin-Eth Estrad Triphasic) .... Take One Tab By Mouth Once Daily  Allergies (verified): No Known Drug Allergies  Review of Systems      See HPI General:  Denies chills and fever. ENT:  Denies earache, hoarseness, nasal congestion, and sinus pressure. CV:  Denies chest pain or discomfort, difficulty breathing at night, difficulty breathing while lying down, palpitations, and shortness of breath with exertion. Resp:  Denies cough and sputum productive. GI:  Denies abdominal pain, constipation, diarrhea, nausea, and vomiting. GU:  Complains of discharge; 1 week h/o inc vag discharge, lMP 6/16 out of pills for 2 weks, . MS:  Denies joint pain and stiffness.  Physical Exam   General:  alert, well-hydrated, and overweight-appearing.  HEENT: No facial asymmetry,  EOMI, No sinus tenderness, TM's Clear, oropharynx  pink and moist.   Chest: Clear to auscultation bilaterally.  CVS: S1, S2, No murmurs, No S3.   Abd: Soft, Nontender.  MS: Adequate ROM spine, hips, shoulders and knees.  Ext: No edema.   CNS: CN 2-12 intact, power tone and sensation normal throughout.   Skin: Intact, no visible lesions or rashes.  Psych: Good eye contact, normal affect.  Memory intact, not anxious or depressed appearing. gU; positive vaginal d/c malodoros    Impression & Recommendations:  Problem # 1:  VAGINITIS&VULVOVAGINITIS DISEASES CLASS ELSW (WGN-562.13) Assessment Comment Only  Orders: T-Wet Prep by Molecular Probe [phone]) T-Chlamydia & GC Probe, Genital ([id number]) will treat based on results  Problem # 2:  OBESITY (ICD-278.00) Assessment: Deteriorated  Ht: 64 (11/09/2008)   Wt: 253.19 (11/09/2008)   BMI: 43.62 (11/09/2008), encouraged pt to stick to a lifestyle change that would facilitate weightloss, and also to reconsider using phentermine half daily to assist her in this, the adverse side effects of the drug were discussed   Problem # 3:  OTH D/O MENSTRUATION&OTH ABN BLEED FE GNT TRACT (ICD-626.8) Assessment: Comment Only pthad a negative pregnancy test in theoffivce. She is given provera challenge and as soon as she starts bleeding , she is to resume her regular oCP as before and discontinue the provera. theimpt of  regularly taking the pill to prevent conception was re-iterated  Complete Medication List: 1)  Ortho-novum 7/7/7 (28) 0.5/0.75/1-35 Mg-mcg Tabs (Norethin-eth estrad triphasic) .... Take one tab by mouth once daily 2)  Provera 5 Mg Tabs (Medroxyprogesterone acetate) .... One tab by mouth bid  Other Orders: Urine Pregnancy Test  (29528)  Patient Instructions: 1)  cPE nov 16 or after 11/16 or after. 2)  It is important that you exercise  regularly at least 20 minutes 5 times a week. If you develop chest pain, have severe difficulty breathing, or feel very tired , stop exercising immediately and seek medical attention. 3)  You need to lose weight. Consider a lower calorie diet and regular exercise.  Prescriptions: PROVERA 5 MG TABS (MEDROXYPROGESTERONE ACETATE) one tab by mouth bid  #10 x 0   Entered by:   Worthy Keeler LPN   Authorized by:   Syliva Overman MD   Signed by:   Worthy Keeler LPN on 41/32/4401   Method used:   Electronically to        [facility]* (retail)       [address] [place], [place]  [address]       Ph: [phone]       Fax: [phone]   RxID:   [id number]   Laboratory Results   Urine Tests  Date/Time Received: 11/09/08 11am Date/Time Reported: 11/09/08 11am    Urine HCG: negative    Appended Document: OV 2 levels havebeen submitted, pls bill at a level 4 only  Appended Document: OV this will be changed

## 2010-06-03 NOTE — Letter (Signed)
Summary: demo  demo   Imported By: Lind Guest 10/03/2009 08:10:56  _____________________________________________________________________  External Attachment:    Type:   Image     Comment:   External Document

## 2010-06-03 NOTE — Letter (Signed)
Summary: [facility]  [facility]   Imported By: Lind Guest 03/26/2008 14:02:37  _____________________________________________________________________  External Attachment:    Type:   Image     Comment:   External Document

## 2010-06-03 NOTE — Letter (Signed)
Summary: consults  consults   Imported By: Lind Guest 10/03/2009 08:14:35  _____________________________________________________________________  External Attachment:    Type:   Image     Comment:   External Document

## 2010-06-03 NOTE — Letter (Signed)
Summary: external records  external records   Imported By: Curtis Sites 12/24/2009 08:49:39  _____________________________________________________________________  External Attachment:    Type:   Image     Comment:   External Document

## 2010-06-03 NOTE — Progress Notes (Signed)
Summary: medicine  Phone Note Call from Patient   Summary of Call: needs a sample pack birth control pills [phone] Initial call taken by: Rudene Anda,  January 30, 2008 4:54 PM  Follow-up for Phone Call        Phone Call Completed Follow-up by: Worthy Keeler LPN,  January 31, 2008 11:43 AM

## 2010-06-03 NOTE — Assessment & Plan Note (Signed)
Summary: office visit   Vital Signs:  Patient profile:   35 year old female Menstrual status:  regular Height:      64 inches Weight:      254 pounds BMI:     43.76 O2 Sat:      96 % Pulse rate:   112 / minute Pulse rhythm:   regular Resp:     16 per minute BP sitting:   120 / 80  (left arm) Cuff size:   large  Vitals Entered By: Everitt Amber (January 17, 2009 11:16 AM) CC: Follow up visit Is Patient Diabetic? No Pain Assessment Patient in pain? no        CC:  Follow up visit.  History of Present Illness: Pt wants  to start the phentermine, she is making noo progress with lifestyle change for weight loss and states she needs help. Patient reports doing well.  Denies any recent fever or chills.  Denies any appetite change or change in bowel movements. Patient denies depression, anxiety or insomnia. She has a 3 day h/o thick nalododors vag d/c which she wants checked.   Current Medications (verified): 1)  Ortho-Novum 7/7/7 (28) 0.5/0.75/1-35 Mg-Mcg Tabs (Norethin-Eth Estrad Triphasic) .... Take One Tab By Mouth Once Daily  Allergies (verified): No Known Drug Allergies  Review of Systems      See HPI General:  Complains of fatigue; denies chills and fever. Eyes:  Denies blurring and discharge. ENT:  Denies earache, hoarseness, nasal congestion, postnasal drainage, sinus pressure, and sore throat. CV:  Denies chest pain or discomfort, difficulty breathing while lying down, fainting, fatigue, lightheadness, palpitations, and swelling of feet. Resp:  Denies cough and sputum productive. GI:  Denies abdominal pain, constipation, diarrhea, nausea, and vomiting. GU:  Complains of discharge; denies dysuria, incontinence, and urinary frequency. MS:  Denies joint pain and stiffness. Derm:  Denies itching and rash. Neuro:  Denies headaches and memory loss. Psych:  Denies anxiety and depression. Endo:  Denies cold intolerance, excessive hunger, excessive thirst, excessive  urination, heat intolerance, polyuria, and weight change. Heme:  Denies abnormal bruising and bleeding. Allergy:  Denies hives or rash and itching eyes.  Physical Exam  General:  alert, well-hydrated, and overweight-appearing.  HEENT: No facial asymmetry,  EOMI, No sinus tenderness, TM's Clear, oropharynx  pink and moist.   Chest: Clear to auscultation bilaterally.  CVS: S1, S2, No murmurs, No S3.   Abd: Soft, Nontender.  MS: Adequate ROM spine, hips, shoulders and knees.  Ext: No edema.   CNS: CN 2-12 intact, power tone and sensation normal throughout.   Skin: Intact, no visible lesions or rashes.  Psych: Good eye contact, normal affect.  Memory intact, not anxious or depressed appearing. pelvic: cream maodoros discharge, pt also on menses at the time of exam    Impression & Recommendations:  Problem # 1:  FATIGUE (ICD-780.79) Assessment Comment Only  Orders: T-Basic Metabolic Panel [id number]) T-CBC w/Diff [id number]) T-TSH [phone])  Problem # 2:  SCREENING FOR LIPOID DISORDERS (ICD-V77.91) Assessment: Comment Only  Orders: T-Lipid Profile ([id number])VQQ fat diet discussed and encouraged  Problem # 3:  OBESITY (ICD-278.00) Assessment: Unchanged  Ht: 64 (01/17/2009)   Wt: 254 (01/17/2009)   BMI: 43.76 (01/17/2009), pt to satart phentermine if labs are ok  Problem # 4:  VAGINITIS&VULVOVAGINITIS DISEASES CLASS ELSW (VZD-638.75) Assessment: Comment Only  Orders: T-Wet Prep by Molecular Probe [phone]) T-Chlamydia & GC Probe, Genital ([id number])  Complete Medication List: 1)  Ortho-novum 7/7/7 (28) 0.5/0.75/1-35 Mg-mcg Tabs (  Norethin-eth estrad triphasic) .... Take one tab by mouth once daily  Patient Instructions: 1)  Please schedule a follow-up appointment in 2 months. 2)  It is important that you exercise regularly at least 20 minutes 5 times a week. If you develop chest pain, have severe difficulty breathing, or feel very tired , stop  exercising immediately and seek medical attention. 3)  You need to lose weight. Consider a lower calorie diet and regular exercise.  4)  You need your labs tomorrow, if they are normal then you will start phentermine HALF  tablet daily.

## 2010-06-03 NOTE — Letter (Signed)
Summary: history and physical  history and physical   Imported By: Lind Guest 10/03/2009 08:11:36  _____________________________________________________________________  External Attachment:    Type:   Image     Comment:   External Document

## 2010-07-18 LAB — POCT URINALYSIS DIPSTICK
Bilirubin Urine: NEGATIVE
Glucose, UA: NEGATIVE mg/dL
Hgb urine dipstick: NEGATIVE
Ketones, ur: NEGATIVE mg/dL
Nitrite: NEGATIVE
Protein, ur: 30 mg/dL — AB
Specific Gravity, Urine: 1.025 (ref 1.005–1.030)
Urobilinogen, UA: 0.2 mg/dL (ref 0.0–1.0)
pH: 6 (ref 5.0–8.0)

## 2010-07-18 LAB — WET PREP, GENITAL
Trich, Wet Prep: NONE SEEN
Yeast Wet Prep HPF POC: NONE SEEN

## 2010-07-18 LAB — GC/CHLAMYDIA PROBE AMP, GENITAL
Chlamydia, DNA Probe: POSITIVE — AB
GC Probe Amp, Genital: NEGATIVE

## 2010-07-18 LAB — POCT PREGNANCY, URINE: Preg Test, Ur: NEGATIVE

## 2010-07-21 LAB — COMPREHENSIVE METABOLIC PANEL
ALT: 13 U/L (ref 0–35)
AST: 17 U/L (ref 0–37)
Albumin: 2.3 g/dL — ABNORMAL LOW (ref 3.5–5.2)
Alkaline Phosphatase: 147 U/L — ABNORMAL HIGH (ref 39–117)
BUN: <1 mg/dL — ABNORMAL LOW (ref 6–23)
CO2: 25 mEq/L (ref 19–32)
Calcium: 8.8 mg/dL (ref 8.4–10.5)
Chloride: 103 mEq/L (ref 96–112)
Creatinine, Ser: 0.39 mg/dL — ABNORMAL LOW (ref 0.4–1.2)
GFR calc Af Amer: >60 mL/min (ref >60)
GFR calc non Af Amer: >60 mL/min (ref >60)
Glucose, Bld: 110 mg/dL — ABNORMAL HIGH (ref 70–99)
Potassium: 3.1 mEq/L — ABNORMAL LOW (ref 3.5–5.1)
Sodium: 134 mEq/L — ABNORMAL LOW (ref 135–145)
Total Bilirubin: 0.1 mg/dL — ABNORMAL LOW (ref 0.3–1.2)
Total Protein: 6.1 g/dL (ref 6.0–8.3)

## 2010-07-21 LAB — PROTEIN / CREATININE RATIO, URINE
Creatinine, Urine: 53.7 mg/dL
Protein Creatinine Ratio: 1.19 — ABNORMAL HIGH (ref 0.00–0.15)
Total Protein, Urine: 64 mg/dL

## 2010-07-21 LAB — URINALYSIS, DIPSTICK ONLY
Bilirubin Urine: NEGATIVE
Glucose, UA: NEGATIVE mg/dL
Ketones, ur: NEGATIVE mg/dL
Leukocytes, UA: NEGATIVE
Nitrite: NEGATIVE
Protein, ur: 30 mg/dL — AB
Specific Gravity, Urine: 1.02 (ref 1.005–1.030)
Urobilinogen, UA: 1 mg/dL (ref 0.0–1.0)
pH: 6.5 (ref 5.0–8.0)

## 2010-07-21 LAB — CBC
HCT: 22.1 % — ABNORMAL LOW (ref 36.0–46.0)
HCT: 27.9 % — ABNORMAL LOW (ref 36.0–46.0)
Hemoglobin: 7.5 g/dL — ABNORMAL LOW (ref 12.0–15.0)
Hemoglobin: 9.4 g/dL — ABNORMAL LOW (ref 12.0–15.0)
MCHC: 33.7 g/dL (ref 30.0–36.0)
MCHC: 33.9 g/dL (ref 30.0–36.0)
MCV: 83.4 fL (ref 78.0–100.0)
MCV: 83.6 fL (ref 78.0–100.0)
Platelets: 497 10*3/uL — ABNORMAL HIGH (ref 150–400)
Platelets: 522 10*3/uL — ABNORMAL HIGH (ref 150–400)
RBC: 2.66 MIL/uL — ABNORMAL LOW (ref 3.87–5.11)
RBC: 3.34 MIL/uL — ABNORMAL LOW (ref 3.87–5.11)
RDW: 14.5 % (ref 11.5–15.5)
RDW: 14.5 % (ref 11.5–15.5)
WBC: 26.9 10*3/uL — ABNORMAL HIGH (ref 4.0–10.5)
WBC: 9.4 10*3/uL (ref 4.0–10.5)

## 2010-07-21 LAB — RPR: RPR Ser Ql: NONREACTIVE

## 2010-07-21 LAB — URIC ACID: Uric Acid, Serum: 4.8 mg/dL (ref 2.4–7.0)

## 2010-07-21 LAB — MRSA PCR SCREENING: MRSA by PCR: NEGATIVE

## 2010-08-05 ENCOUNTER — Emergency Department (HOSPITAL_COMMUNITY)
Admission: EM | Admit: 2010-08-05 | Discharge: 2010-08-05 | Disposition: A | Discharge status: Home / Self Care | Payer: BC Managed Care – PPO | Attending: Emergency Medicine | Admitting: Emergency Medicine

## 2010-08-05 DIAGNOSIS — M545 Low back pain, unspecified: Secondary | ICD-10-CM | POA: Insufficient documentation

## 2010-08-05 DIAGNOSIS — I10 Essential (primary) hypertension: Secondary | ICD-10-CM | POA: Insufficient documentation

## 2010-08-05 DIAGNOSIS — X58XXXA Exposure to other specified factors, initial encounter: Secondary | ICD-10-CM | POA: Insufficient documentation

## 2010-08-05 DIAGNOSIS — S335XXA Sprain of ligaments of lumbar spine, initial encounter: Secondary | ICD-10-CM | POA: Insufficient documentation

## 2010-08-05 LAB — URINALYSIS, ROUTINE W REFLEX MICROSCOPIC
Bilirubin Urine: NEGATIVE
Glucose, UA: NEGATIVE mg/dL
Hgb urine dipstick: NEGATIVE
Ketones, ur: NEGATIVE mg/dL
Leukocytes, UA: NEGATIVE
Nitrite: NEGATIVE
Protein, ur: 30 mg/dL — AB
Specific Gravity, Urine: 1.03 (ref 1.005–1.030)
Urobilinogen, UA: 0.2 mg/dL (ref 0.0–1.0)
pH: 5.5 (ref 5.0–8.0)

## 2010-08-05 LAB — URINE MICROSCOPIC-ADD ON

## 2010-08-05 LAB — POCT PREGNANCY, URINE: Preg Test, Ur: NEGATIVE

## 2010-08-26 ENCOUNTER — Encounter: Payer: Self-pay | Admitting: Family Medicine

## 2010-08-29 ENCOUNTER — Encounter: Payer: Self-pay | Admitting: Family Medicine

## 2010-09-01 ENCOUNTER — Encounter: Payer: Self-pay | Admitting: Family Medicine

## 2010-09-19 NOTE — Op Note (Signed)
Rose Mcguire, Rose Mcguire                         ACCOUNT NO.:  [id number]   MEDICAL RECORD NO.:  [id number]                   PATIENT TYPE:  INP   LOCATION:  LDR1                                 FACILITY:  [facility]   PHYSICIAN:  Langley Gauss, M.D.                DATE OF BIRTH:  [birth year]   DATE OF PROCEDURE:  06/18/2002  DATE OF DISCHARGE:                                 OPERATIVE REPORT   DIAGNOSIS:  A 38 week intrauterine pregnancy and labor.   DELIVERY PERFORMED:  1. Spontaneous assisted vaginal delivery of 6 pounds 4.5 ounces female infant.  2. Midline episiotomy repair.   DELIVERY PERFORMED BY:  Langley Gauss, M.D.   ESTIMATED BLOOD LOSS:  Less than 500 mL.   COMPLICATIONS:  None.   SPECIMENS:  Arterial cord, vascular cord blood to pathology.  Placenta was  examined and noted to be apparently intact with a two-vessel umbilical cord.   FINDINGS JUST AT TIME OF DELIVERY:  Light meconium-stained amniotic fluid  which was managed appropriate.  Also, the patient is noted to be group B  Strep carrier status.  She did receive IV ampicillin during the course of  labor.  During the transition phase of labor, the patient did spike a  temperature to 101.1.  At that time, she was treated with 1 g of IV Rocephin  for broad-spectrum coverage.   ASSESSMENT:  The patient is a gravida 1, para 0, admitted at 3 cm dilatation  where on exam of the patient, she was noted to be 6.  Amniotomy was  performed with findings of clear amniotic fluid.  The fetal scalp electrode  was placed; reassuring fetal heart rate is documented.  The patient remained  at 6 x several hours and was noted to have dysfunctional labor pattern  secondary to inadequate strength to _________ contractions.  Thus, Pitocin  augmentation was initiated.  The patient received only IV Nubain during the  course of labor.  She thereafter pressed normally along the labor curve to  complete dilatation.  She was placed in the dorsal  lithotomy position and  pushed well during the second stage of labor, prepped and draped in the  usual sterile manner.  The vertex descended to the perineal floor with  distention of the perineum; 20 mL of lidocaine plain injected into the  perineum.  Small midline episiotomy was performed.  It bent the vertex and  delivered in direct OA position.  Noted just prior to delivery was thin  meconium-stained amniotic fluid.  Thus, DeLee suction and connected to wall  suction was utilized to suction the mouth and ears prior to delivery of the  shoulders.  Following this, spontaneous rotation occurred to a left anterior  shoulder position, gentle down retraction with maternal expulsive efforts  resulted in this shoulder delivering out of the pubic symphysis without  difficulty, remainder of the infant also delivered without difficulty.  The  infant was noted to have a spontaneous and vigorous breathe and cry and good  color.  The umbilical cord was then moved towards the infant.  Cord was  doubly clamped and cut, and infant is handed to nursing staff for assessment  over at the nursing table.  Arterial cord gas and cord blood were then  obtained.  Gentle traction on the umbilical cord resulted in separation  which upon examination, is an intact three-vessel placenta and attached  cord.  Examination of  genital tract revealed no lacerations.  Midline episiotomy is peritonealized  using 0 chromic and in a running locked fashion in vaginal mucosa, followed  by two layer closure of 0 chromic on her perineal body.  Mother and infant  doing well following delivery.                                               Langley Gauss, M.D.    DC/MEDQ  D:  06/18/2002  T:  06/18/2002  Job:  [id number]   cc:   [facility]

## 2010-09-19 NOTE — H&P (Signed)
Rose Mcguire, Rose Mcguire                         ACCOUNT NO.:  [id number]   MEDICAL RECORD NO.:  [id number]                   PATIENT TYPE:  INP   LOCATION:  LDR1                                 FACILITY:  [facility]   PHYSICIAN:  Langley Gauss, M.D.                DATE OF BIRTH:  1975/10/14   DATE OF ADMISSION:  06/17/2002  DATE OF DISCHARGE:                                HISTORY & PHYSICAL   HISTORY OF PRESENT ILLNESS:  This is a 35 year old gravida 1, para 0, [redacted]  weeks gestation, who presents to [facility] in active labor.   PRENATAL COURSE:  She has been followed through the office of Dr. Christin Bach at [facility].  Prenatal course has been complicated only  by findings of being a group B Strep carrier, thus she required treatment  during course of labor, A positive blood type, AFT triple screen within  normal limits, sickle index is negative.  GTT result not available.   PAST MEDICAL HISTORY:  She has no known drug allergies.   CURRENT MEDICATIONS:  Prenatal vitamins.  No other medical or surgical  history.   PHYSICAL EXAMINATION:  VITAL SIGNS:  Height 5'3, weight 233, blood pressure  132/83, pulse 101, respiratory rate 20, temperature 98.9.  HEENT:  Negative.  No adenopathy.  NECK:  Supple.  Mucous membranes are moist.  Thyroid is nonpalpable.  LUNGS:  Clear.  CARDIOVASCULAR:  Sinus regular rate and rhythm.  ABDOMEN:  Soft and nontender. Vertex presentation by Leopold's maneuvers.  Fundal height 38 cm.  EXTREMITIES:  Normal.  PELVIC:  Normal external genitalia.  No lesions or ulcerations identified.  No leakage of fluid.  No vaginal bleeding.  Examination initially by the  nursing staff, 3 cm dilated, bulging intact membranes.  External fetal  monitor contracting q.3 to 5 minutes with a reassuring fetal heart rate.  The patient is admitted in active labor.  When I examined the patient she is  noted to be 6 cm dilated, completely effaced, 0 station, with intact  bulging  membranes and vertex presentation confirmed.  Contracting q.3 to 5 minutes  with a reassuring fetal heart rate.    ASSESSMENT:  A 40 week intrauterine pregnancy, active stage of labor.  The patient is  treated with ampicillin 2 grams IV, followed by 1 gram IV q.4h during the  course of labor in an effort to prevent vertical transmission of the carrier  status to the infant.                                                 Langley Gauss, M.D.    DC/MEDQ  D:  06/18/2002  T:  06/18/2002  Job:  [id number]   cc:   Tilda Burrow, M.D.  [address] [place]  [place] [address]  [place] [address]  Fax: [phone]

## 2011-07-03 ENCOUNTER — Encounter: Payer: Self-pay | Admitting: Family Medicine

## 2011-07-03 ENCOUNTER — Ambulatory Visit (INDEPENDENT_AMBULATORY_CARE_PROVIDER_SITE_OTHER): Payer: PRIVATE HEALTH INSURANCE | Admitting: Family Medicine

## 2011-07-03 VITALS — BP 140/100 | HR 104 | Resp 16 | Ht 64.0 in | Wt 250.0 lb

## 2011-07-03 DIAGNOSIS — R5383 Other fatigue: Secondary | ICD-10-CM

## 2011-07-03 DIAGNOSIS — J309 Allergic rhinitis, unspecified: Secondary | ICD-10-CM | POA: Insufficient documentation

## 2011-07-03 DIAGNOSIS — I10 Essential (primary) hypertension: Secondary | ICD-10-CM

## 2011-07-03 DIAGNOSIS — E669 Obesity, unspecified: Secondary | ICD-10-CM

## 2011-07-03 DIAGNOSIS — R5381 Other malaise: Secondary | ICD-10-CM

## 2011-07-03 DIAGNOSIS — Z309 Encounter for contraceptive management, unspecified: Secondary | ICD-10-CM

## 2011-07-03 DIAGNOSIS — Z13220 Encounter for screening for lipoid disorders: Secondary | ICD-10-CM

## 2011-07-03 LAB — BASIC METABOLIC PANEL
BUN: 6 mg/dL (ref 6–23)
CO2: 27 mEq/L (ref 19–32)
Calcium: 9.3 mg/dL (ref 8.4–10.5)
Chloride: 104 mEq/L (ref 96–112)
Creat: 0.64 mg/dL (ref 0.50–1.10)
Glucose, Bld: 85 mg/dL (ref 70–99)
Potassium: 3.7 mEq/L (ref 3.5–5.3)
Sodium: 139 mEq/L (ref 135–145)

## 2011-07-03 LAB — CBC WITH DIFFERENTIAL/PLATELET
Basophils Absolute: 0 10*3/uL (ref 0.0–0.1)
Basophils Relative: 0 % (ref 0–1)
Eosinophils Absolute: 0 10*3/uL (ref 0.0–0.7)
Eosinophils Relative: 0 % (ref 0–5)
HCT: 40.8 % (ref 36.0–46.0)
Hemoglobin: 12.2 g/dL (ref 12.0–15.0)
Lymphocytes Relative: 24 % (ref 12–46)
Lymphs Abs: 1.9 10*3/uL (ref 0.7–4.0)
MCH: 26.5 pg (ref 26.0–34.0)
MCHC: 29.9 g/dL — ABNORMAL LOW (ref 30.0–36.0)
MCV: 88.5 fL (ref 78.0–100.0)
Monocytes Absolute: 0.5 10*3/uL (ref 0.1–1.0)
Monocytes Relative: 6 % (ref 3–12)
Neutro Abs: 5.7 10*3/uL (ref 1.7–7.7)
Neutrophils Relative %: 70 % (ref 43–77)
Platelets: 451 10*3/uL — ABNORMAL HIGH (ref 150–400)
RBC: 4.61 MIL/uL (ref 3.87–5.11)
RDW: 13.3 % (ref 11.5–15.5)
WBC: 8.1 10*3/uL (ref 4.0–10.5)

## 2011-07-03 LAB — TSH: TSH: 1.068 u[IU]/mL (ref 0.350–4.500)

## 2011-07-03 LAB — LIPID PANEL
Cholesterol: 159 mg/dL (ref 0–200)
HDL: 65 mg/dL (ref >39)
LDL Cholesterol: 85 mg/dL (ref 0–99)
Total CHOL/HDL Ratio: 2.4 Ratio
Triglycerides: 45 mg/dL (ref <150)
VLDL: 9 mg/dL (ref 0–40)

## 2011-07-03 LAB — HEMOGLOBIN A1C
Hgb A1c MFr Bld: 5.4 % (ref <5.7)
Mean Plasma Glucose: 108 mg/dL (ref <117)

## 2011-07-03 LAB — POCT URINE PREGNANCY: Preg Test, Ur: NEGATIVE

## 2011-07-03 MED ORDER — TRIAMTERENE-HCTZ 37.5-25 MG PO TABS: 1.0000 | ORAL_TABLET | Freq: Every day | ORAL | Status: DC

## 2011-07-03 MED ORDER — CETIRIZINE HCL 10 MG PO TABS: 10.0000 mg | ORAL_TABLET | Freq: Every day | ORAL | Status: DC

## 2011-07-03 MED ORDER — NORGESTIM-ETH ESTRAD TRIPHASIC 0.18/0.215/0.25 MG-25 MCG PO TABS: 1.0000 | ORAL_TABLET | Freq: Every day | ORAL | Status: DC

## 2011-07-03 MED ORDER — LORATADINE 10 MG PO TABS: 10.0000 mg | ORAL_TABLET | Freq: Every day | ORAL | Status: DC | PRN

## 2011-07-03 NOTE — Assessment & Plan Note (Signed)
Uncontrolled due to med non compliance, also needs to address lifestyle change

## 2011-07-03 NOTE — Assessment & Plan Note (Signed)
Increased symptoms x 1 week and will worsen in Spring, med prescribed

## 2011-07-03 NOTE — Patient Instructions (Addendum)
CPE as before.  Fasting lipid, chem7, tsh, hBa1C, cbc today.  Pls remove sugar, sweets, sweet drinks, candy , cake and ice cream from home.  Commit to 3 meals per day.  Walk/exercise 30 minutes every day  BP is high, med sent in  You will get a 1500 cal diet sheet, also info on the DASH diet  Med sent in for contraception and allergies

## 2011-07-03 NOTE — Progress Notes (Signed)
  Subjective:    Patient ID: Rose Mcguire, female    DOB: 07-Sep-1975, 36 y.o.   MRN: [id number]  HPI  The PT is here to re establish care  And for  re-evaluation of chronic medical conditions, medication management and review of any available recent lab and radiology data. She has a 34 y/o and has not been in since then and has not had a pap Preventive health is updated, specifically  Cancer screening and Immunization.   C/o uncontrolled allergy symptoms and requests medication for this. Needs BP medication , has not taken this for months Weight continues to be a challenge, she has not been taking this as seriously as she needs to , but intends to work on this in small steps. States her 10y/o already has a mouth full of cavities from sweets    Review of Systems See HPI Denies recent fever or chills. Denies sinus pressure, nasal congestion, ear pain or sore throat. Denies chest congestion, productive cough or wheezing. Denies chest pains, palpitations and leg swelling Denies abdominal pain, nausea, vomiting,diarrhea or constipation.   Denies dysuria, frequency, hesitancy or incontinence. Denies joint pain, swelling and limitation in mobility. Denies headaches, seizures, numbness, or tingling. Denies depression, anxiety or insomnia. Denies skin break down or rash.        Objective:   Physical Exam  Patient alert and oriented and in no cardiopulmonary distress.  HEENT: No facial asymmetry, EOMI, no sinus tenderness,  oropharynx pink and moist.  Neck supple no adenopathy.  Chest: Clear to auscultation bilaterally.  CVS: S1, S2 no murmurs, no S3.  ABD: Soft non tender. Bowel sounds normal.  Ext: No edema  MS: Adequate ROM spine, shoulders, hips and knees.  Skin: Intact, no ulcerations or rash noted.  Psych: Good eye contact, normal affect. Memory intact not anxious or depressed appearing.  CNS: CN 2-12 intact, power, tone and sensation normal throughout.         Assessment & Plan:

## 2011-07-03 NOTE — Assessment & Plan Note (Signed)
Deteriorated. Patient re-educated about  the importance of commitment to a  minimum of 150 minutes of exercise per week. The importance of healthy food choices with portion control discussed. Encouraged to start a food diary, count calories and to consider  joining a support group. Sample diet sheets offered. Goals set by the patient for the next several months.    

## 2011-07-03 NOTE — Assessment & Plan Note (Signed)
Re educate pt re need to take med regularly and condom use for safe sex

## 2011-08-19 ENCOUNTER — Ambulatory Visit (INDEPENDENT_AMBULATORY_CARE_PROVIDER_SITE_OTHER): Payer: PRIVATE HEALTH INSURANCE | Admitting: Family Medicine

## 2011-08-19 ENCOUNTER — Encounter: Payer: Self-pay | Admitting: Family Medicine

## 2011-08-19 ENCOUNTER — Other Ambulatory Visit (HOSPITAL_COMMUNITY)
Admission: RE | Admit: 2011-08-19 | Discharge: 2011-08-19 | Disposition: A | Discharge status: Home / Self Care | Payer: PRIVATE HEALTH INSURANCE | Source: Ambulatory Visit | Attending: Family Medicine | Admitting: Family Medicine

## 2011-08-19 VITALS — BP 130/84 | HR 106 | Resp 18 | Ht 64.0 in | Wt 254.1 lb

## 2011-08-19 DIAGNOSIS — N926 Irregular menstruation, unspecified: Secondary | ICD-10-CM

## 2011-08-19 DIAGNOSIS — Z0000 Encounter for general adult medical examination without abnormal findings: Secondary | ICD-10-CM

## 2011-08-19 DIAGNOSIS — D473 Essential (hemorrhagic) thrombocythemia: Secondary | ICD-10-CM

## 2011-08-19 DIAGNOSIS — E669 Obesity, unspecified: Secondary | ICD-10-CM

## 2011-08-19 DIAGNOSIS — Z01419 Encounter for gynecological examination (general) (routine) without abnormal findings: Secondary | ICD-10-CM | POA: Insufficient documentation

## 2011-08-19 DIAGNOSIS — I10 Essential (primary) hypertension: Secondary | ICD-10-CM

## 2011-08-19 DIAGNOSIS — N771 Vaginitis, vulvitis and vulvovaginitis in diseases classified elsewhere: Secondary | ICD-10-CM

## 2011-08-19 DIAGNOSIS — D75839 Thrombocytosis, unspecified: Secondary | ICD-10-CM

## 2011-08-19 DIAGNOSIS — N949 Unspecified condition associated with female genital organs and menstrual cycle: Secondary | ICD-10-CM

## 2011-08-19 DIAGNOSIS — N938 Other specified abnormal uterine and vaginal bleeding: Secondary | ICD-10-CM

## 2011-08-19 NOTE — Patient Instructions (Addendum)
F/u in 4 month   You will be referred for dietary counseling to help with weight loss.  It is important that you exercise regularly at least 30 minutes 5 times a week. If you develop chest pain, have severe difficulty breathing, or feel very tired, stop exercising immediately and seek medical attention   Weight  loss goal of 4 pounds per month  New birth control pill per your request  Eat 1500calories per day.  Do NOT drink sugar.  You will be referred to hematology for evaluation

## 2011-08-19 NOTE — Progress Notes (Signed)
  Subjective:    Patient ID: Rose Mcguire, female    DOB: 1975/10/24, 36 y.o.   MRN: [id number]  HPI  The PT is here for annual exam and re-evaluation of chronic medical conditions, medication management and review of any available recent lab and radiology data.  Preventive health is updated, specifically  Cancer screening and Immunization.   Questions or concerns regarding consultations or procedures which the PT has had in the interim are  addressed. The PT denies any adverse reactions to current medications since the last visit.  C/o increased weight despite desire to lose weight , inconsistent attempts at lifestyle change noted   Review of Systems See HPI Denies recent fever or chills. Denies sinus pressure, nasal congestion, ear pain or sore throat. Denies chest congestion, productive cough or wheezing. Denies chest pains, palpitations and leg swelling Denies abdominal pain, nausea, vomiting,diarrhea or constipation.   Denies dysuria, frequency, hesitancy or incontinence. Denies joint pain, swelling and limitation in mobility. Denies headaches, seizures, numbness, or tingling. Denies depression, anxiety or insomnia. Denies skin break down or rash.        Objective:   Physical Exam Pleasant obese female, alert and oriented x 3, in no cardio-pulmonary distress. Afebrile. HEENT No facial trauma or asymetry. Sinuses non tender.  EOMI, PERTL, fundoscopic exam no hemorhage or exudate.  External ears normal, tympanic membranes clear. Oropharynx moist, no exudate, fair  dentition. Neck: supple, no adenopathy,JVD or thyromegaly.No bruits.  Chest: Clear to ascultation bilaterally.No crackles or wheezes. Non tender to palpation  Breast: No asymetry,no masses. No nipple discharge or inversion. No axillary or supraclavicular adenopathy  Cardiovascular system; Heart sounds normal,  S1 and  S2 ,no S3.  No murmur, or thrill. Apical beat not displaced Peripheral pulses  normal.  Abdomen: Soft, non tender, no organomegaly or masses. No bruits. Bowel sounds normal. No guarding, tenderness or rebound.    GU: External genitalia normal. No lesions. Vaginal canal normal.fishy  discharge. Uterus normal size, no adnexal masses, no cervical motion or adnexal tenderness.  Musculoskeletal exam: Full ROM of spine, hips , shoulders and knees. No deformity ,swelling or crepitus noted. No muscle wasting or atrophy.   Neurologic: Cranial nerves 2 to 12 intact. Power, tone ,sensation and reflexes normal throughout. No disturbance in gait. No tremor.  Skin: Intact, no ulceration, erythema , scaling or rash noted. Pigmentation normal throughout  Psych; Normal mood and affect. Judgement and concentration normal        Assessment & Plan:

## 2011-08-21 ENCOUNTER — Other Ambulatory Visit: Payer: Self-pay

## 2011-08-21 LAB — GC/CHLAMYDIA PROBE AMP, GENITAL
Chlamydia, DNA Probe: POSITIVE — AB
GC Probe Amp, Genital: NEGATIVE

## 2011-08-21 LAB — WET PREP BY MOLECULAR PROBE
Candida species: NEGATIVE
Gardnerella vaginalis: POSITIVE — AB
Trichomonas vaginosis: NEGATIVE

## 2011-08-21 MED ORDER — METRONIDAZOLE 500 MG PO TABS: 500.0000 mg | ORAL_TABLET | Freq: Two times a day (BID) | ORAL | Status: AC

## 2011-08-21 MED ORDER — NORGESTIM-ETH ESTRAD TRIPHASIC 0.18/0.215/0.25 MG-35 MCG PO TABS: 1.0000 | ORAL_TABLET | Freq: Every day | ORAL | Status: AC

## 2011-08-23 NOTE — Assessment & Plan Note (Signed)
Controlled, no change in medication  

## 2011-08-23 NOTE — Assessment & Plan Note (Signed)
Specimens sent for testing, will treat based on results

## 2011-08-23 NOTE — Assessment & Plan Note (Signed)
Deteriorated. Patient re-educated about  the importance of commitment to a  minimum of 150 minutes of exercise per week. The importance of healthy food choices with portion control discussed. Encouraged to start a food diary, count calories and to consider  joining a support group. Sample diet sheets offered. Goals set by the patient for the next several months.    

## 2011-08-23 NOTE — Assessment & Plan Note (Signed)
persistent elevation in platelets over several years , hematology to review

## 2011-08-24 ENCOUNTER — Telehealth (HOSPITAL_COMMUNITY): Payer: Self-pay | Admitting: Dietician

## 2011-08-24 NOTE — Telephone Encounter (Signed)
Received referral from Dr. Lodema Hong [facility]) for dx: obesity.

## 2011-08-28 NOTE — Telephone Encounter (Signed)
Sent letter to pt home via [employer] Mail in attempt to contact pt to schedule appointment.  

## 2011-09-01 ENCOUNTER — Ambulatory Visit (HOSPITAL_COMMUNITY): Payer: PRIVATE HEALTH INSURANCE

## 2011-09-01 ENCOUNTER — Other Ambulatory Visit (HOSPITAL_COMMUNITY): Payer: PRIVATE HEALTH INSURANCE

## 2011-09-02 NOTE — Telephone Encounter (Signed)
Sent letter to pt home via [employer] Mail in attempt to contact pt to schedule appointment.  

## 2011-09-07 NOTE — Telephone Encounter (Signed)
Pt has not responded to attempts to contact to schedule appointment. Referral filed.  

## 2011-10-30 ENCOUNTER — Other Ambulatory Visit: Payer: Self-pay | Admitting: Family Medicine

## 2011-10-30 ENCOUNTER — Telehealth: Payer: Self-pay | Admitting: Family Medicine

## 2011-10-30 NOTE — Telephone Encounter (Signed)
Script ordered, pls fax and let her know

## 2011-11-06 ENCOUNTER — Other Ambulatory Visit: Payer: Self-pay

## 2011-11-06 MED ORDER — METRONIDAZOLE 500 MG PO TABS: 500.0000 mg | ORAL_TABLET | Freq: Two times a day (BID) | ORAL | Status: DC

## 2011-11-06 NOTE — Telephone Encounter (Signed)
Med sent.

## 2011-12-18 ENCOUNTER — Ambulatory Visit: Payer: PRIVATE HEALTH INSURANCE | Admitting: Family Medicine

## 2011-12-18 ENCOUNTER — Encounter: Payer: Self-pay | Admitting: Family Medicine

## 2012-01-06 ENCOUNTER — Ambulatory Visit: Payer: PRIVATE HEALTH INSURANCE | Admitting: Family Medicine

## 2012-01-12 NOTE — Progress Notes (Unsigned)
No show.  Reschedule prn.  

## 2012-01-21 ENCOUNTER — Ambulatory Visit: Payer: PRIVATE HEALTH INSURANCE | Admitting: Family Medicine

## 2012-02-04 ENCOUNTER — Ambulatory Visit: Payer: PRIVATE HEALTH INSURANCE | Admitting: Family Medicine

## 2012-02-05 ENCOUNTER — Ambulatory Visit (INDEPENDENT_AMBULATORY_CARE_PROVIDER_SITE_OTHER): Payer: PRIVATE HEALTH INSURANCE | Admitting: Family Medicine

## 2012-02-05 ENCOUNTER — Encounter: Payer: Self-pay | Admitting: Family Medicine

## 2012-02-05 VITALS — BP 130/84 | HR 104 | Resp 15 | Ht 64.0 in | Wt 277.4 lb

## 2012-02-05 DIAGNOSIS — Z23 Encounter for immunization: Secondary | ICD-10-CM

## 2012-02-05 DIAGNOSIS — E669 Obesity, unspecified: Secondary | ICD-10-CM

## 2012-02-05 DIAGNOSIS — I10 Essential (primary) hypertension: Secondary | ICD-10-CM

## 2012-02-05 MED ORDER — PHENTERMINE HCL 37.5 MG PO TABS: 37.5000 mg | ORAL_TABLET | Freq: Every day | ORAL | Status: DC

## 2012-02-05 NOTE — Assessment & Plan Note (Signed)
Controlled, no change in medication DASH diet and commitment to daily physical activity for a minimum of 30 minutes discussed and encouraged, as a part of hypertension management. The importance of attaining a healthy weight is also discussed.  

## 2012-02-05 NOTE — Patient Instructions (Addendum)
F/u in 8 weeks.  Commit to physical activity 30 minutes 7 days per week  Commit to 64 ounces water daily.  Commit to 2 main meals already calorie counted, like lean cuisine or healthy choice, breakfast is your first meal every day  Snack on frozen or fresh fruit and vegetable  Weight loss goal of 1 to 2 pounds per month  Blood pressure is good.  Flu vaccine today  Take HALF phentermine once daily though script says whole

## 2012-02-05 NOTE — Progress Notes (Signed)
  Subjective:    Patient ID: Rose Mcguire, female    DOB: 01/15/1976, 36 y.o.   MRN: [id number]  HPI The PT is here for follow up and re-evaluation of chronic medical conditions, medication management and review of any available recent lab and radiology data. She is distressed about ongoing weight gain and wants to address this Preventive health is updated, specifically  Cancer screening and Immunization.        Review of Systems See HPI Denies recent fever or chills. Denies sinus pressure, nasal congestion, ear pain or sore throat. Denies chest congestion, productive cough or wheezing. Denies chest pains, palpitations and leg swelling Denies abdominal pain, nausea, vomiting,diarrhea or constipation.   Denies dysuria, frequency, hesitancy or incontinence. Denies joint pain, swelling and limitation in mobility. Denies headaches, seizures, numbness, or tingling. Denies depression, anxiety or insomnia. Denies skin break down or rash.        Objective:   Physical Exam   Patient alert and oriented and in no cardiopulmonary distress.  HEENT: No facial asymmetry, EOMI, no sinus tenderness,  oropharynx pink and moist.  Neck supple no adenopathy.  Chest: Clear to auscultation bilaterally.  CVS: S1, S2 no murmurs, no S3.  ABD: Soft non tender. Bowel sounds normal.  Ext: No edema  MS: Adequate ROM spine, shoulders, hips and knees.  Skin: Intact, no ulcerations or rash noted.  Psych: Good eye contact, normal affect. Memory intact not anxious or depressed appearing.  CNS: CN 2-12 intact, power, tone and sensation normal throughout.      Assessment & Plan:

## 2012-02-05 NOTE — Assessment & Plan Note (Signed)
Deteriorated. Patient re-educated about  the importance of commitment to a  minimum of 150 minutes of exercise per week. The importance of healthy food choices with portion control discussed. Encouraged to start a food diary, count calories and to consider  joining a support group. Sample diet sheets offered. Goals set by the patient for the next several months.    

## 2012-03-02 ENCOUNTER — Telehealth: Payer: Self-pay | Admitting: Family Medicine

## 2012-03-02 NOTE — Telephone Encounter (Signed)
Printed and pt aware  

## 2012-04-05 ENCOUNTER — Ambulatory Visit: Payer: PRIVATE HEALTH INSURANCE | Admitting: Family Medicine

## 2012-04-05 ENCOUNTER — Encounter: Payer: Self-pay | Admitting: Family Medicine

## 2013-02-22 ENCOUNTER — Encounter: Payer: Self-pay | Admitting: Family Medicine

## 2013-02-22 ENCOUNTER — Encounter (INDEPENDENT_AMBULATORY_CARE_PROVIDER_SITE_OTHER): Payer: Self-pay

## 2013-02-22 ENCOUNTER — Ambulatory Visit (INDEPENDENT_AMBULATORY_CARE_PROVIDER_SITE_OTHER): Payer: 59 | Admitting: Family Medicine

## 2013-02-22 ENCOUNTER — Telehealth: Payer: Self-pay | Admitting: Family Medicine

## 2013-02-22 VITALS — BP 120/86 | HR 106 | Temp 100.8°F | Resp 16 | Ht 64.0 in | Wt 278.1 lb

## 2013-02-22 DIAGNOSIS — R5381 Other malaise: Secondary | ICD-10-CM

## 2013-02-22 DIAGNOSIS — A499 Bacterial infection, unspecified: Secondary | ICD-10-CM

## 2013-02-22 DIAGNOSIS — I10 Essential (primary) hypertension: Secondary | ICD-10-CM

## 2013-02-22 DIAGNOSIS — J208 Acute bronchitis due to other specified organisms: Secondary | ICD-10-CM | POA: Insufficient documentation

## 2013-02-22 DIAGNOSIS — J209 Acute bronchitis, unspecified: Secondary | ICD-10-CM

## 2013-02-22 DIAGNOSIS — E669 Obesity, unspecified: Secondary | ICD-10-CM

## 2013-02-22 DIAGNOSIS — B9689 Other specified bacterial agents as the cause of diseases classified elsewhere: Secondary | ICD-10-CM

## 2013-02-22 MED ORDER — TRIAMTERENE-HCTZ 37.5-25 MG PO TABS: ORAL_TABLET | ORAL | Status: DC

## 2013-02-22 MED ORDER — PHENTERMINE HCL 37.5 MG PO TABS: 37.5000 mg | ORAL_TABLET | Freq: Every day | ORAL | Status: DC

## 2013-02-22 MED ORDER — BENZONATATE 100 MG PO CAPS: 100.0000 mg | ORAL_CAPSULE | Freq: Four times a day (QID) | ORAL | Status: DC | PRN

## 2013-02-22 MED ORDER — FLUCONAZOLE 150 MG PO TABS: ORAL_TABLET | ORAL | Status: AC

## 2013-02-22 MED ORDER — AZITHROMYCIN 250 MG PO TABS: ORAL_TABLET | ORAL | Status: AC

## 2013-02-22 NOTE — Telephone Encounter (Signed)
Patient not seen in 1 year.  Scheduled for ov

## 2013-02-22 NOTE — Patient Instructions (Addendum)
F/u as before.  Nurse visit in 2 weeks for flu vaccine  Reduce dose of Maxzide HALf  Tablet daily (for blood pressure)    Resume phentermine half daily, weight loss goal of 5 pounds per month  Antibiotic, and decongestant sent gor acute bacterial bronchitis  CBC , fasting lipid, chem 7, HBA1C, TSH, asap

## 2013-02-22 NOTE — Progress Notes (Signed)
  Subjective:    Patient ID: Emy Angevine Scales, female    DOB: 1975-11-26, 37 y.o.   MRN: [id number]  HPI 4 day h/o excessive cough and chest congestion  With fever and chills. Initially non productive but in past 2 days she has started producing thick green sputum. 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 . Denies chest pains, palpitations and leg swelling Denies abdominal pain, nausea, vomiting,diarrhea or constipation.   Denies dysuria, frequency, hesitancy or incontinence. C/o generalized joint and muscle pains with onset of sickness.  Denies depression, anxiety or insomnia. Denies skin break down or rash.        Objective:   Physical Exam  Patient alert and oriented and in no cardiopulmonary distress.Ill appearing   HEENT: No facial asymmetry, EOMI, no sinus tenderness,  oropharynx pink and moist.  Neck supple no adenopathy.  Chest: decfreased air entry, though adeqiuate scattered crackles and few wheezes  CVS: S1, S2 no murmurs, no S3.  ABD: Soft non tender. Bowel sounds normal.  Ext: No edema  MS: Adequate ROM spine, shoulders, hips and knees.  Skin: Intact, no ulcerations or rash noted.  Psych: Good eye contact, normal affect. Memory intact not anxious or depressed appearing.  CNS: CN 2-12 intact, power, tone and sensation normal throughout.       Assessment & Plan:

## 2013-02-23 NOTE — Telephone Encounter (Signed)
Ok excuse for yesterday and today I will sign

## 2013-02-23 NOTE — Telephone Encounter (Signed)
Needs a note for yesterday and today being out of work and school please call back at [phone]

## 2013-02-23 NOTE — Telephone Encounter (Signed)
Please advise if this is ok.  

## 2013-02-23 NOTE — Telephone Encounter (Signed)
Letter composed and patient aware 

## 2013-03-01 ENCOUNTER — Ambulatory Visit: Payer: 59

## 2013-03-26 NOTE — Assessment & Plan Note (Signed)
antibiotic and decongestant prescribed, also work excuse written

## 2013-03-26 NOTE — Assessment & Plan Note (Signed)
Deteriorated. Patient re-educated about  the importance of commitment to a  minimum of 150 minutes of exercise per week. The importance of healthy food choices with portion control discussed. Encouraged to start a food diary, count calories and to consider  joining a support group. Sample diet sheets offered. Goals set by the patient for the next several months.   Start phentermine half daily 

## 2013-03-26 NOTE — Assessment & Plan Note (Signed)
Over corrected and pt symptomatic , reduce maxzide dose

## 2013-04-04 ENCOUNTER — Telehealth: Payer: Self-pay | Admitting: Family Medicine

## 2013-04-04 ENCOUNTER — Other Ambulatory Visit (HOSPITAL_COMMUNITY)
Admission: RE | Admit: 2013-04-04 | Discharge: 2013-04-04 | Disposition: A | Discharge status: Home / Self Care | Payer: 59 | Source: Ambulatory Visit | Attending: Family Medicine | Admitting: Family Medicine

## 2013-04-04 ENCOUNTER — Ambulatory Visit (INDEPENDENT_AMBULATORY_CARE_PROVIDER_SITE_OTHER): Payer: 59 | Admitting: Family Medicine

## 2013-04-04 ENCOUNTER — Encounter (INDEPENDENT_AMBULATORY_CARE_PROVIDER_SITE_OTHER): Payer: Self-pay

## 2013-04-04 ENCOUNTER — Encounter: Payer: Self-pay | Admitting: Family Medicine

## 2013-04-04 VITALS — BP 130/90 | HR 100 | Resp 16 | Ht 64.0 in | Wt 278.8 lb

## 2013-04-04 DIAGNOSIS — Z1151 Encounter for screening for human papillomavirus (HPV): Secondary | ICD-10-CM | POA: Insufficient documentation

## 2013-04-04 DIAGNOSIS — Z23 Encounter for immunization: Secondary | ICD-10-CM

## 2013-04-04 DIAGNOSIS — Z01419 Encounter for gynecological examination (general) (routine) without abnormal findings: Secondary | ICD-10-CM | POA: Insufficient documentation

## 2013-04-04 DIAGNOSIS — Z124 Encounter for screening for malignant neoplasm of cervix: Secondary | ICD-10-CM

## 2013-04-04 DIAGNOSIS — N760 Acute vaginitis: Secondary | ICD-10-CM | POA: Insufficient documentation

## 2013-04-04 DIAGNOSIS — E669 Obesity, unspecified: Secondary | ICD-10-CM

## 2013-04-04 DIAGNOSIS — Z0000 Encounter for general adult medical examination without abnormal findings: Secondary | ICD-10-CM

## 2013-04-04 DIAGNOSIS — Z113 Encounter for screening for infections with a predominantly sexual mode of transmission: Secondary | ICD-10-CM | POA: Insufficient documentation

## 2013-04-04 DIAGNOSIS — N949 Unspecified condition associated with female genital organs and menstrual cycle: Secondary | ICD-10-CM

## 2013-04-04 DIAGNOSIS — N938 Other specified abnormal uterine and vaginal bleeding: Secondary | ICD-10-CM

## 2013-04-04 LAB — LIPID PANEL
Cholesterol: 149 mg/dL (ref 0–200)
HDL: 53 mg/dL (ref >39)
LDL Cholesterol: 87 mg/dL (ref 0–99)
Total CHOL/HDL Ratio: 2.8 Ratio
Triglycerides: 46 mg/dL (ref <150)
VLDL: 9 mg/dL (ref 0–40)

## 2013-04-04 LAB — CBC
HCT: 36.5 % (ref 36.0–46.0)
Hemoglobin: 11.9 g/dL — ABNORMAL LOW (ref 12.0–15.0)
MCH: 26.6 pg (ref 26.0–34.0)
MCHC: 32.6 g/dL (ref 30.0–36.0)
MCV: 81.7 fL (ref 78.0–100.0)
Platelets: 526 10*3/uL — ABNORMAL HIGH (ref 150–400)
RBC: 4.47 MIL/uL (ref 3.87–5.11)
RDW: 14 % (ref 11.5–15.5)
WBC: 6.6 10*3/uL (ref 4.0–10.5)

## 2013-04-04 LAB — BASIC METABOLIC PANEL
BUN: 7 mg/dL (ref 6–23)
CO2: 27 mEq/L (ref 19–32)
Calcium: 9.7 mg/dL (ref 8.4–10.5)
Chloride: 103 mEq/L (ref 96–112)
Creat: 0.65 mg/dL (ref 0.50–1.10)
Glucose, Bld: 95 mg/dL (ref 70–99)
Potassium: 5 mEq/L (ref 3.5–5.3)
Sodium: 139 mEq/L (ref 135–145)

## 2013-04-04 LAB — HEMOGLOBIN A1C
Hgb A1c MFr Bld: 6 % — ABNORMAL HIGH (ref <5.7)
Mean Plasma Glucose: 126 mg/dL — ABNORMAL HIGH (ref <117)

## 2013-04-04 LAB — TSH: TSH: 1.473 u[IU]/mL (ref 0.350–4.500)

## 2013-04-04 MED ORDER — NORETHIN-ETH ESTRAD TRIPHASIC 0.5/0.75/1-35 MG-MCG PO TABS: 1.0000 | ORAL_TABLET | Freq: Every day | ORAL | Status: DC

## 2013-04-04 MED ORDER — PHENTERMINE HCL 37.5 MG PO TABS: 37.5000 mg | ORAL_TABLET | Freq: Every day | ORAL | Status: DC

## 2013-04-04 NOTE — Progress Notes (Signed)
   Subjective:    Patient ID: Rose Mcguire, female    DOB: [birth year], 37 y.o.   MRN: [id number]  HPI Pt in for annual exam Requests help with appetite suppressant for weight loss, states she is absolutely committing to regular exercise and dietary change needed . Wants STD exposure testing on exam and contraceptive pills refilled x 1 year   Review of Systems See HPI Denies recent fever or chills. Denies sinus pressure, nasal congestion, ear pain or sore throat. Denies chest congestion, productive cough or wheezing. Denies chest pains, palpitations and leg swelling Denies abdominal pain, nausea, vomiting,diarrhea or constipation.   Denies dysuria, frequency, hesitancy or incontinence. Denies joint pain, swelling and limitation in mobility. Denies headaches, seizures, numbness, or tingling. Denies depression, anxiety or insomnia. Denies skin break down or rash.        Objective:   Physical Exam  Pleasant well nourished female, alert and oriented x 3, in no cardio-pulmonary distress. Afebrile. HEENT No facial trauma or asymetry. Sinuses non tender.  EOMI, PERTL, fundoscopic exam no hemorhage or exudate.  External ears normal, tympanic membranes clear. Oropharynx moist, no exudate,fairly  good dentition. Neck: supple, no adenopathy,JVD or thyromegaly.No bruits.  Chest: Clear to ascultation bilaterally.No crackles or wheezes. Non tender to palpation  Breast: No asymetry,no masses. No nipple discharge or inversion. No axillary or supraclavicular adenopathy  Cardiovascular system; Heart sounds normal,  S1 and  S2 ,no S3.  No murmur, or thrill. Apical beat not displaced Peripheral pulses normal.  Abdomen: Soft, non tender, no organomegaly or masses. No bruits. Bowel sounds normal. No guarding, tenderness or rebound.  GU: External genitalia normal. No lesions. Vaginal canal normal.Fishy grey  discharge. Uterus normal size, no adnexal masses, no cervical  motion or adnexal tenderness.  Musculoskeletal exam: Full ROM of spine, hips , shoulders and knees. No deformity ,swelling or crepitus noted. No muscle wasting or atrophy.   Neurologic: Cranial nerves 2 to 12 intact. Power, tone ,sensation and reflexes normal throughout. No disturbance in gait. No tremor.  Skin: Intact, no ulceration, erythema , scaling or rash noted. Pigmentation normal throughout  Psych; Normal mood and affect. Judgement and concentration normal       Assessment & Plan:

## 2013-04-04 NOTE — Telephone Encounter (Signed)
REFILL SENT AND PATIENT AWARE

## 2013-04-04 NOTE — Patient Instructions (Addendum)
F/u in 4 month, call if you need me before  It is important that you exercise regularly at least 30 minutes 5 times a week. If you develop chest pain, have severe difficulty breathing, or feel very tired, stop exercising immediately and seek medical attention   A healthy diet is rich in fruit, vegetables and whole grains. Poultry fish, nuts and beans are a healthy choice for protein rather then red meat. A low sodium diet and drinking 64 ounces of water daily is generally recommended. Oils and sweet should be limited. Carbohydrates especially for those who are diabetic or overweight, should be limited to 60-45 gram per meal. It is important to eat on a regular schedule, at least 3 times daily. Snacks should be primarily fruits, vegetables or nuts.  Weight loss goal of 3 to 4 pounds per month, need o dio this to stay on phentermine.  Birth control pills  are sent to your pharmacy, start today since you are now on your cycle, and you are referred for pelvic US due to irregular bleeding   Flu vaccine today  Labs today please you will get order again

## 2013-04-05 MED ORDER — METRONIDAZOLE 500 MG PO TABS: 500.0000 mg | ORAL_TABLET | Freq: Two times a day (BID) | ORAL | Status: AC

## 2013-04-10 ENCOUNTER — Ambulatory Visit (HOSPITAL_COMMUNITY): Payer: 59

## 2013-04-10 ENCOUNTER — Ambulatory Visit (HOSPITAL_COMMUNITY): Admission: RE | Admit: 2013-04-10 | Payer: 59 | Source: Ambulatory Visit

## 2013-05-07 DIAGNOSIS — Z0000 Encounter for general adult medical examination without abnormal findings: Secondary | ICD-10-CM | POA: Insufficient documentation

## 2013-05-07 NOTE — Assessment & Plan Note (Signed)
C/o abnormal and irregular bleeding, referred for pelvic US

## 2013-05-07 NOTE — Assessment & Plan Note (Signed)
Annual exam completed as documented. Major current health issue is morbid obesity. Pt committed to lifestyle change needed to lose weight, she is also to start phentermine for help with appetite control Flu vaccine administered,and labs to be drawn today

## 2013-05-08 DIAGNOSIS — Z23 Encounter for immunization: Secondary | ICD-10-CM

## 2013-08-08 ENCOUNTER — Ambulatory Visit: Payer: 59 | Admitting: Family Medicine

## 2013-10-05 ENCOUNTER — Ambulatory Visit: Payer: Self-pay | Admitting: Family Medicine

## 2013-10-19 ENCOUNTER — Telehealth: Payer: Self-pay | Admitting: Family Medicine

## 2013-10-19 ENCOUNTER — Other Ambulatory Visit: Payer: Self-pay | Admitting: Family Medicine

## 2013-10-19 DIAGNOSIS — E669 Obesity, unspecified: Secondary | ICD-10-CM

## 2013-10-19 MED ORDER — PHENTERMINE HCL 37.5 MG PO TABS: 37.5000 mg | ORAL_TABLET | Freq: Every day | ORAL | Status: DC

## 2013-10-19 NOTE — Telephone Encounter (Signed)
[place]

## 2013-11-09 ENCOUNTER — Ambulatory Visit: Payer: Self-pay | Admitting: Family Medicine

## 2014-08-27 ENCOUNTER — Ambulatory Visit: Payer: Self-pay | Admitting: Family Medicine

## 2014-09-04 ENCOUNTER — Encounter: Payer: Self-pay | Admitting: Family Medicine

## 2014-09-04 ENCOUNTER — Other Ambulatory Visit (HOSPITAL_COMMUNITY)
Admission: RE | Admit: 2014-09-04 | Discharge: 2014-09-04 | Disposition: A | Discharge status: Home / Self Care | Payer: BLUE CROSS/BLUE SHIELD | Source: Ambulatory Visit | Attending: Family Medicine | Admitting: Family Medicine

## 2014-09-04 ENCOUNTER — Ambulatory Visit (INDEPENDENT_AMBULATORY_CARE_PROVIDER_SITE_OTHER): Payer: BLUE CROSS/BLUE SHIELD | Admitting: Family Medicine

## 2014-09-04 VITALS — BP 170/96 | HR 100 | Resp 18 | Ht 64.0 in | Wt 281.0 lb

## 2014-09-04 DIAGNOSIS — I10 Essential (primary) hypertension: Secondary | ICD-10-CM | POA: Diagnosis not present

## 2014-09-04 DIAGNOSIS — N771 Vaginitis, vulvitis and vulvovaginitis in diseases classified elsewhere: Secondary | ICD-10-CM

## 2014-09-04 DIAGNOSIS — R7309 Other abnormal glucose: Secondary | ICD-10-CM

## 2014-09-04 DIAGNOSIS — Z1321 Encounter for screening for nutritional disorder: Secondary | ICD-10-CM | POA: Diagnosis not present

## 2014-09-04 DIAGNOSIS — R7302 Impaired glucose tolerance (oral): Secondary | ICD-10-CM

## 2014-09-04 DIAGNOSIS — R7303 Prediabetes: Secondary | ICD-10-CM

## 2014-09-04 DIAGNOSIS — N760 Acute vaginitis: Secondary | ICD-10-CM | POA: Diagnosis present

## 2014-09-04 DIAGNOSIS — Z113 Encounter for screening for infections with a predominantly sexual mode of transmission: Secondary | ICD-10-CM | POA: Diagnosis present

## 2014-09-04 DIAGNOSIS — E559 Vitamin D deficiency, unspecified: Secondary | ICD-10-CM

## 2014-09-04 DIAGNOSIS — F4321 Adjustment disorder with depressed mood: Secondary | ICD-10-CM

## 2014-09-04 DIAGNOSIS — Z23 Encounter for immunization: Secondary | ICD-10-CM

## 2014-09-04 MED ORDER — TRIAMTERENE-HCTZ 37.5-25 MG PO TABS
1.0000 | ORAL_TABLET | Freq: Every day | ORAL | Status: DC
Start: 2014-09-04 — End: 2015-06-17

## 2014-09-04 NOTE — Patient Instructions (Addendum)
F/u in 6 week s , call if you need me before Tetanus today  Nurse BP check in 2 week  Fasting lipid, cmp ,HBA1C CBC, tSH, vit D asap  Urine is being tested for infection, and you will be contacted with results  Please contact employee health for help with grieving  My condolence on your loss    Start maxzide one daily for blood pressure  Start walking daily for 30 minutes  Change eating habits

## 2014-09-08 LAB — COMPREHENSIVE METABOLIC PANEL
ALT: 9 U/L (ref 0–35)
AST: 11 U/L (ref 0–37)
Albumin: 4.2 g/dL (ref 3.5–5.2)
Alkaline Phosphatase: 79 U/L (ref 39–117)
BUN: 8 mg/dL (ref 6–23)
CO2: 32 mEq/L (ref 19–32)
Calcium: 9.7 mg/dL (ref 8.4–10.5)
Chloride: 96 mEq/L (ref 96–112)
Creat: 0.76 mg/dL (ref 0.50–1.10)
Glucose, Bld: 93 mg/dL (ref 70–99)
Potassium: 4.1 mEq/L (ref 3.5–5.3)
Sodium: 136 mEq/L (ref 135–145)
Total Bilirubin: 0.4 mg/dL (ref 0.2–1.2)
Total Protein: 8.1 g/dL (ref 6.0–8.3)

## 2014-09-08 LAB — LIPID PANEL
Cholesterol: 155 mg/dL (ref 0–200)
HDL: 70 mg/dL (ref >=46)
LDL Cholesterol: 75 mg/dL (ref 0–99)
Total CHOL/HDL Ratio: 2.2 Ratio
Triglycerides: 49 mg/dL (ref <150)
VLDL: 10 mg/dL (ref 0–40)

## 2014-09-08 LAB — CBC
HCT: 40.8 % (ref 36.0–46.0)
Hemoglobin: 13.1 g/dL (ref 12.0–15.0)
MCH: 27 pg (ref 26.0–34.0)
MCHC: 32.1 g/dL (ref 30.0–36.0)
MCV: 84 fL (ref 78.0–100.0)
MPV: 9.1 fL (ref 8.6–12.4)
Platelets: 463 10*3/uL — ABNORMAL HIGH (ref 150–400)
RBC: 4.86 MIL/uL (ref 3.87–5.11)
RDW: 14 % (ref 11.5–15.5)
WBC: 6.5 10*3/uL (ref 4.0–10.5)

## 2014-09-08 LAB — HEMOGLOBIN A1C
Hgb A1c MFr Bld: 5.9 % — ABNORMAL HIGH (ref <5.7)
Mean Plasma Glucose: 123 mg/dL — ABNORMAL HIGH (ref <117)

## 2014-09-08 LAB — VITAMIN D 25 HYDROXY (VIT D DEFICIENCY, FRACTURES): Vit D, 25-Hydroxy: 14 ng/mL — ABNORMAL LOW (ref 30–100)

## 2014-09-08 LAB — TSH: TSH: 1.69 u[IU]/mL (ref 0.350–4.500)

## 2014-09-10 LAB — URINE CYTOLOGY ANCILLARY ONLY
Chlamydia: NEGATIVE
Neisseria Gonorrhea: NEGATIVE
Trichomonas: NEGATIVE

## 2014-09-12 LAB — URINE CYTOLOGY ANCILLARY ONLY: Candida vaginitis: NEGATIVE

## 2014-09-18 ENCOUNTER — Ambulatory Visit: Payer: BLUE CROSS/BLUE SHIELD

## 2014-09-18 VITALS — BP 134/88 | Wt 275.0 lb

## 2014-09-18 DIAGNOSIS — I10 Essential (primary) hypertension: Secondary | ICD-10-CM

## 2014-09-18 MED ORDER — METRONIDAZOLE 500 MG PO TABS: 500.0000 mg | ORAL_TABLET | Freq: Two times a day (BID) | ORAL | Status: DC

## 2014-09-18 MED ORDER — FLUCONAZOLE 150 MG PO TABS: 150.0000 mg | ORAL_TABLET | Freq: Once | ORAL | Status: DC

## 2014-09-18 NOTE — Progress Notes (Signed)
Patient advised to keep next appointment and call back if she has any problems

## 2014-10-09 DIAGNOSIS — E559 Vitamin D deficiency, unspecified: Secondary | ICD-10-CM | POA: Insufficient documentation

## 2014-10-09 DIAGNOSIS — Z23 Encounter for immunization: Secondary | ICD-10-CM | POA: Insufficient documentation

## 2014-10-09 DIAGNOSIS — R7303 Prediabetes: Secondary | ICD-10-CM | POA: Insufficient documentation

## 2014-10-09 DIAGNOSIS — F4320 Adjustment disorder, unspecified: Secondary | ICD-10-CM | POA: Insufficient documentation

## 2014-10-09 DIAGNOSIS — F4321 Adjustment disorder with depressed mood: Secondary | ICD-10-CM | POA: Insufficient documentation

## 2014-10-09 NOTE — Progress Notes (Signed)
Subjective:    Patient ID: Rose Mcguire, female    DOB: 06-27-1975, 39 y.o.   MRN: [id number]  HPI The PT is here for follow up and re-evaluation of chronic medical conditions, medication management and review of any available recent lab and radiology data.  Preventive health is updated, specifically  Cancer screening and Immunization.   Lost her husband in recent times and is suffering from anxiety and depression, not suicidal or homicidal, will benefit from therapy Concerned about weight gain wants help, no current commitment to healthy lifestyle but intends to work on this    Review of Systems See HPI Denies recent fever or chills. Denies sinus pressure, nasal congestion, ear pain or sore throat. Denies chest congestion, productive cough or wheezing. Denies chest pains, palpitations and leg swelling Denies abdominal pain, nausea, vomiting,diarrhea or constipation.   Denies dysuria, frequency, hesitancy or incontinence. Denies joint pain, swelling and limitation in mobility. Denies headaches, seizures, numbness, or tingling.  Denies skin break down or rash.        Objective:   Physical Exam BP 170/96 mmHg  Pulse 100  Resp 18  Ht 5\' 4"  (1.626 m)  Wt 281 lb (127.461 kg)  BMI 48.21 kg/m2  SpO2 98%  LMP 08/28/2014 Patient alert and oriented and in no cardiopulmonary distress.  HEENT: No facial asymmetry, EOMI,   oropharynx pink and moist.  Neck supple no JVD, no mass.  Chest: Clear to auscultation bilaterally.  CVS: S1, S2 no murmurs, no S3.Regular rate.  ABD: Soft non tender.   Ext: No edema  MS: Adequate ROM spine, shoulders, hips and knees.  Skin: Intact, no ulcerations or rash noted.  Psych: Good eye contact, tearful at times. Memory intact not anxious mildly  depressed appearing.  CNS: CN 2-12 intact, power,  normal throughout.no focal deficits noted.        Assessment & Plan:  Essential hypertension Start maxzide and nurse BP re eval in 2  weeks DASH diet and commitment to daily physical activity for a minimum of 30 minutes discussed and encouraged, as a part of hypertension management. The importance of attaining a healthy weight is also discussed.  BP/Weight 09/18/2014 09/04/2014 04/04/2013 02/22/2013 02/05/2012 0/92/3300 11/06/2261  Systolic BP 335 456 256 389 373 428 768  Diastolic BP 88 96 90 86 84 84 100  Wt. (Lbs) 275 281 278.8 278.12 277.4 254.12 250  BMI 47.18 48.21 47.83 47.72 47.59 43.6 42.89         Vulvovaginitis associated with another disorder Specimens sent for testing , will be treated based on result   Morbid obesity Deteriorated. Will start phentermine when BP normalizes Patient re-educated about  the importance of commitment to a  minimum of 150 minutes of exercise per week.  The importance of healthy food choices with portion control discussed. Encouraged to start a food diary, count calories and to consider  joining a support group. Sample diet sheets offered. Goals set by the patient for the next several months.   Weight /BMI 09/18/2014 09/04/2014 04/04/2013  WEIGHT 275 lb 281 lb 278 lb 12.8 oz  HEIGHT - 5\' 4"  5\' 4"   BMI 47.18 kg/m2 48.21 kg/m2 47.83 kg/m2    Current exercise per week 90 minutes.    Prediabetes Slight improvement Patient educated about the importance of limiting  Carbohydrate intake , the need to commit to daily physical activity for a minimum of 30 minutes , and to commit weight loss. The fact that changes in all these  areas will reduce or eliminate all together the development of diabetes is stressed.   Diabetic Labs Latest Ref Rng 09/07/2014 04/04/2013 07/03/2011 10/04/2009 01/18/2009  HbA1c <5.7 % 5.9(H) 6.0(H) 5.4 - -  Chol 0 - 200 mg/dL 155 149 159 - 158  HDL >=46 mg/dL 70 53 65 - 71  Calc LDL 0 - 99 mg/dL 75 87 85 - 76  Triglycerides <150 mg/dL 49 46 45 - 54  Creatinine 0.50 - 1.10 mg/dL 0.76 0.65 0.64 0.39(L) 0.68   BP/Weight 09/18/2014 09/04/2014 04/04/2013 02/22/2013  02/05/2012 5/84/8350 11/05/7320  Systolic BP 567 209 198 022 179 810 254  Diastolic BP 88 96 90 86 84 84 100  Wt. (Lbs) 275 281 278.8 278.12 277.4 254.12 250  BMI 47.18 48.21 47.83 47.72 47.59 43.6 42.89   No flowsheet data found.      Vitamin D deficiency Lab updated and needs weekly vit D   Grief reaction Appropriate grief from recent loss of spouse, counseling through employee health is recommended, option to seek help privately is also given

## 2014-10-09 NOTE — Assessment & Plan Note (Signed)
Specimens sent for testing , will be treated based on result

## 2014-10-09 NOTE — Assessment & Plan Note (Signed)
Appropriate grief from recent loss of spouse, counseling through employee health is recommended, option to seek help privately is also given

## 2014-10-09 NOTE — Assessment & Plan Note (Signed)
Start maxzide and nurse BP re eval in 2 weeks DASH diet and commitment to daily physical activity for a minimum of 30 minutes discussed and encouraged, as a part of hypertension management. The importance of attaining a healthy weight is also discussed.  BP/Weight 09/18/2014 09/04/2014 04/04/2013 02/22/2013 02/05/2012 01/11/3111 05/09/2444  Systolic BP 950 722 575 051 833 582 518  Diastolic BP 88 96 90 86 84 84 100  Wt. (Lbs) 275 281 278.8 278.12 277.4 254.12 250  BMI 47.18 48.21 47.83 47.72 47.59 43.6 42.89

## 2014-10-09 NOTE — Assessment & Plan Note (Addendum)
Deteriorated. Will start phentermine when BP normalizes Patient re-educated about  the importance of commitment to a  minimum of 150 minutes of exercise per week.  The importance of healthy food choices with portion control discussed. Encouraged to start a food diary, count calories and to consider  joining a support group. Sample diet sheets offered. Goals set by the patient for the next several months.   Weight /BMI 09/18/2014 09/04/2014 04/04/2013  WEIGHT 275 lb 281 lb 278 lb 12.8 oz  HEIGHT - 5\' 4"  5\' 4"   BMI 47.18 kg/m2 48.21 kg/m2 47.83 kg/m2    Current exercise per week 90 minutes.

## 2014-10-09 NOTE — Assessment & Plan Note (Signed)
Lab updated and needs weekly vit D

## 2014-10-09 NOTE — Assessment & Plan Note (Addendum)
Slight improvement Patient educated about the importance of limiting  Carbohydrate intake , the need to commit to daily physical activity for a minimum of 30 minutes , and to commit weight loss. The fact that changes in all these areas will reduce or eliminate all together the development of diabetes is stressed.   Diabetic Labs Latest Ref Rng 09/07/2014 04/04/2013 07/03/2011 10/04/2009 01/18/2009  HbA1c <5.7 % 5.9(H) 6.0(H) 5.4 - -  Chol 0 - 200 mg/dL 155 149 159 - 158  HDL >=46 mg/dL 70 53 65 - 71  Calc LDL 0 - 99 mg/dL 75 87 85 - 76  Triglycerides <150 mg/dL 49 46 45 - 54  Creatinine 0.50 - 1.10 mg/dL 0.76 0.65 0.64 0.39(L) 0.68   BP/Weight 09/18/2014 09/04/2014 04/04/2013 02/22/2013 02/05/2012 4/59/9774 05/07/2393  Systolic BP 320 233 435 686 168 372 902  Diastolic BP 88 96 90 86 84 84 100  Wt. (Lbs) 275 281 278.8 278.12 277.4 254.12 250  BMI 47.18 48.21 47.83 47.72 47.59 43.6 42.89   No flowsheet data found.

## 2014-10-09 NOTE — Assessment & Plan Note (Signed)
After obtaining informed consent, the vaccine is  administered by LPN.  

## 2014-10-10 DIAGNOSIS — Z23 Encounter for immunization: Secondary | ICD-10-CM | POA: Insufficient documentation

## 2014-10-10 NOTE — Assessment & Plan Note (Signed)
Tetanus vaccine and not TdAP was administered at the visit

## 2014-10-16 ENCOUNTER — Encounter: Payer: Self-pay | Admitting: Family Medicine

## 2014-10-16 ENCOUNTER — Ambulatory Visit (INDEPENDENT_AMBULATORY_CARE_PROVIDER_SITE_OTHER): Payer: BLUE CROSS/BLUE SHIELD | Admitting: Family Medicine

## 2014-10-16 VITALS — BP 134/84 | HR 84 | Resp 16 | Ht 64.0 in | Wt 280.0 lb

## 2014-10-16 DIAGNOSIS — F4321 Adjustment disorder with depressed mood: Secondary | ICD-10-CM

## 2014-10-16 DIAGNOSIS — R7309 Other abnormal glucose: Secondary | ICD-10-CM

## 2014-10-16 DIAGNOSIS — I10 Essential (primary) hypertension: Secondary | ICD-10-CM | POA: Diagnosis not present

## 2014-10-16 DIAGNOSIS — R7303 Prediabetes: Secondary | ICD-10-CM

## 2014-10-16 MED ORDER — METFORMIN HCL ER (MOD) 500 MG PO TB24: 500.0000 mg | ORAL_TABLET | Freq: Every day | ORAL | Status: DC

## 2014-10-16 MED ORDER — NORETHINDRONE-ETH ESTRADIOL 1-35 MG-MCG PO TABS: 1.0000 | ORAL_TABLET | Freq: Every day | ORAL | Status: DC

## 2014-10-16 MED ORDER — PHENTERMINE HCL 37.5 MG PO TABS: 37.5000 mg | ORAL_TABLET | Freq: Every day | ORAL | Status: DC

## 2014-10-16 NOTE — Progress Notes (Signed)
Subjective:    Patient ID: Rose Mcguire, female    DOB: 06-17-1975, 39 y.o.   MRN: [id number]  HPI The PT is here for follow up and re-evaluation of chronic medical conditions, medication management and review of any available recent lab and radiology data.  Preventive health is updated, specifically  Cancer screening and Immunization.   Questions or concerns regarding consultations or procedures which the PT has had in the interim are  addressed. The PT denies any adverse reactions to current medications since the last visit.  There are no new concerns.  There are no specific complaints       Review of Systems See HPI Denies recent fever or chills. Denies sinus pressure, nasal congestion, ear pain or sore throat. Denies chest congestion, productive cough or wheezing. Denies chest pains, palpitations and leg swelling Denies abdominal pain, nausea, vomiting,diarrhea or constipation.   Denies dysuria, frequency, hesitancy or incontinence. Denies joint pain, swelling and limitation in mobility. Denies headaches, seizures, numbness, or tingling. Denies depression, anxiety or insomnia. Denies skin break down or rash.        Objective:   Physical Exam BP 134/84 mmHg  Pulse 84  Resp 16  Ht 5\' 4"  (1.626 m)  Wt 280 lb (127.007 kg)  BMI 48.04 kg/m2  SpO2 98% Patient alert and oriented and in no cardiopulmonary distress.  HEENT: No facial asymmetry, EOMI,   oropharynx pink and moist.  Neck supple no JVD, no mass.  Chest: Clear to auscultation bilaterally.  CVS: S1, S2 no murmurs, no S3.Regular rate.  ABD: Soft non tender.   Ext: No edema  MS: Adequate ROM spine, shoulders, hips and knees.  Skin: Intact, no ulcerations or rash noted.  Psych: Good eye contact, normal affect. Memory intact not anxious or depressed appearing.  CNS: CN 2-12 intact, power,  normal throughout.no focal deficits noted.        Assessment & Plan:  Essential  hypertension Controlled, no change in medication DASH diet and commitment to daily physical activity for a minimum of 30 minutes discussed and encouraged, as a part of hypertension management. The importance of attaining a healthy weight is also discussed.  BP/Weight 10/16/2014 09/18/2014 09/04/2014 04/04/2013 02/22/2013 02/05/2012 7/40/8144  Systolic BP 818 563 149 702 637 858 850  Diastolic BP 84 88 96 90 86 84 84  Wt. (Lbs) 280 275 281 278.8 278.12 277.4 254.12  BMI 48.04 47.18 48.21 47.83 47.72 47.59 43.6        Prediabetes Rose Mcguire is reminded of the importance of commitment to daily physical activity for 30 minutes or more, as able and the need to limit carbohydrate intake to 30 to 60 grams per meal to help with blood sugar control.   The need to take medication as prescribed, test blood sugar as directed, and to call between visits if there is a concern that blood sugar is uncontrolled is also discussed.   Rose Mcguire is reminded of the importance of daily foot exam, annual eye examination, and good blood sugar, blood pressure and cholesterol control.  Diabetic Labs Latest Ref Rng 09/07/2014 04/04/2013 07/03/2011 10/04/2009 01/18/2009  HbA1c <5.7 % 5.9(H) 6.0(H) 5.4 - -  Chol 0 - 200 mg/dL 155 149 159 - 158  HDL >=46 mg/dL 70 53 65 - 71  Calc LDL 0 - 99 mg/dL 75 87 85 - 76  Triglycerides <150 mg/dL 49 46 45 - 54  Creatinine 0.50 - 1.10 mg/dL 0.76 0.65 0.64 0.39(L) 0.68  BP/Weight 10/16/2014 09/18/2014 09/04/2014 04/04/2013 02/22/2013 02/05/2012 06/03/8655  Systolic BP 846 962 952 841 324 401 027  Diastolic BP 84 88 96 90 86 84 84  Wt. (Lbs) 280 275 281 278.8 278.12 277.4 254.12  BMI 48.04 47.18 48.21 47.83 47.72 47.59 43.6   No flowsheet data found.  improved     Morbid obesity Deteriorated. Patient re-educated about  the importance of commitment to a  minimum of 150 minutes of exercise per week.  The importance of healthy food choices with portion control discussed. Encouraged to  start a food diary, count calories and to consider  joining a support group. Sample diet sheets offered. Goals set by the patient for the next several months.   Weight /BMI 10/16/2014 09/18/2014 09/04/2014  WEIGHT 280 lb 275 lb 281 lb  HEIGHT 5\' 4"  - 5\' 4"   BMI 48.04 kg/m2 47.18 kg/m2 48.21 kg/m2    Current exercise per week 60 minutes. deteriorated   Grief reaction Improving with time

## 2014-10-16 NOTE — Patient Instructions (Addendum)
F/u in 4 month,call if you need me before  New to help with weight and blood sugar  Are metformin and HALF phentermine  Please work on good  health habits so that your health will improve. 1. Commitment to daily physical activity for 30 to 60  minutes, if you are able to do this.  2. Commitment to wise food choices. Aim for half of your  food intake to be vegetable and fruit, one quarter starchy foods, and one quarter protein. Try to eat on a regular schedule  3 meals per day, snacking between meals should be limited to vegetables or fruits or small portions of nuts. 64 ounces of water per day is generally recommended, unless you have specific health conditions, like heart failure or kidney failure where you will need to limit fluid intake.  3. Commitment to sufficient and a  good quality of physical and mental rest daily, generally between 6 to 8 hours per day.  WITH PERSISTANCE AND PERSEVERANCE, THE IMPOSSIBLE , BECOMES THE NORM!   Weight loss goal of 15 pounds

## 2014-10-23 ENCOUNTER — Other Ambulatory Visit: Payer: Self-pay

## 2014-10-23 DIAGNOSIS — R7303 Prediabetes: Secondary | ICD-10-CM

## 2014-10-23 MED ORDER — METFORMIN HCL ER 500 MG PO TB24: 500.0000 mg | ORAL_TABLET | Freq: Every day | ORAL | Status: DC

## 2014-11-04 NOTE — Assessment & Plan Note (Signed)
Rose Mcguire is reminded of the importance of commitment to daily physical activity for 30 minutes or more, as able and the need to limit carbohydrate intake to 30 to 60 grams per meal to help with blood sugar control.   The need to take medication as prescribed, test blood sugar as directed, and to call between visits if there is a concern that blood sugar is uncontrolled is also discussed.   Rose Mcguire is reminded of the importance of daily foot exam, annual eye examination, and good blood sugar, blood pressure and cholesterol control.  Diabetic Labs Latest Ref Rng 09/07/2014 04/04/2013 07/03/2011 10/04/2009 01/18/2009  HbA1c <5.7 % 5.9(H) 6.0(H) 5.4 - -  Chol 0 - 200 mg/dL 155 149 159 - 158  HDL >=46 mg/dL 70 53 65 - 71  Calc LDL 0 - 99 mg/dL 75 87 85 - 76  Triglycerides <150 mg/dL 49 46 45 - 54  Creatinine 0.50 - 1.10 mg/dL 0.76 0.65 0.64 0.39(L) 0.68   BP/Weight 10/16/2014 09/18/2014 09/04/2014 04/04/2013 02/22/2013 02/05/2012 10/29/3149  Systolic BP 761 607 371 062 694 854 627  Diastolic BP 84 88 96 90 86 84 84  Wt. (Lbs) 280 275 281 278.8 278.12 277.4 254.12  BMI 48.04 47.18 48.21 47.83 47.72 47.59 43.6   No flowsheet data found.  improved

## 2014-11-04 NOTE — Assessment & Plan Note (Signed)
Deteriorated. Patient re-educated about  the importance of commitment to a  minimum of 150 minutes of exercise per week.  The importance of healthy food choices with portion control discussed. Encouraged to start a food diary, count calories and to consider  joining a support group. Sample diet sheets offered. Goals set by the patient for the next several months.   Weight /BMI 10/16/2014 09/18/2014 09/04/2014  WEIGHT 280 lb 275 lb 281 lb  HEIGHT 5\' 4"  - 5\' 4"   BMI 48.04 kg/m2 47.18 kg/m2 48.21 kg/m2    Current exercise per week 60 minutes. deteriorated

## 2014-11-04 NOTE — Assessment & Plan Note (Signed)
Improving with time

## 2014-11-04 NOTE — Assessment & Plan Note (Signed)
Controlled, no change in medication DASH diet and commitment to daily physical activity for a minimum of 30 minutes discussed and encouraged, as a part of hypertension management. The importance of attaining a healthy weight is also discussed.  BP/Weight 10/16/2014 09/18/2014 09/04/2014 04/04/2013 02/22/2013 02/05/2012 6/62/9476  Systolic BP 546 503 546 568 127 517 001  Diastolic BP 84 88 96 90 86 84 84  Wt. (Lbs) 280 275 281 278.8 278.12 277.4 254.12  BMI 48.04 47.18 48.21 47.83 47.72 47.59 43.6

## 2015-01-29 ENCOUNTER — Ambulatory Visit (INDEPENDENT_AMBULATORY_CARE_PROVIDER_SITE_OTHER): Payer: 59 | Admitting: Family Medicine

## 2015-01-29 ENCOUNTER — Other Ambulatory Visit (HOSPITAL_COMMUNITY)
Admission: RE | Admit: 2015-01-29 | Discharge: 2015-01-29 | Disposition: A | Discharge status: Home / Self Care | Payer: 59 | Source: Ambulatory Visit | Attending: Family Medicine | Admitting: Family Medicine

## 2015-01-29 ENCOUNTER — Encounter: Payer: Self-pay | Admitting: Family Medicine

## 2015-01-29 VITALS — BP 124/82 | HR 85 | Resp 16 | Ht 64.0 in | Wt 275.0 lb

## 2015-01-29 DIAGNOSIS — Z0000 Encounter for general adult medical examination without abnormal findings: Secondary | ICD-10-CM | POA: Diagnosis not present

## 2015-01-29 DIAGNOSIS — Z01419 Encounter for gynecological examination (general) (routine) without abnormal findings: Secondary | ICD-10-CM | POA: Diagnosis present

## 2015-01-29 DIAGNOSIS — N760 Acute vaginitis: Secondary | ICD-10-CM

## 2015-01-29 DIAGNOSIS — Z23 Encounter for immunization: Secondary | ICD-10-CM | POA: Diagnosis not present

## 2015-01-29 DIAGNOSIS — N771 Vaginitis, vulvitis and vulvovaginitis in diseases classified elsewhere: Secondary | ICD-10-CM

## 2015-01-29 DIAGNOSIS — R7309 Other abnormal glucose: Secondary | ICD-10-CM

## 2015-01-29 DIAGNOSIS — I10 Essential (primary) hypertension: Secondary | ICD-10-CM

## 2015-01-29 DIAGNOSIS — E559 Vitamin D deficiency, unspecified: Secondary | ICD-10-CM | POA: Diagnosis not present

## 2015-01-29 DIAGNOSIS — Z124 Encounter for screening for malignant neoplasm of cervix: Secondary | ICD-10-CM

## 2015-01-29 DIAGNOSIS — R7303 Prediabetes: Secondary | ICD-10-CM

## 2015-01-29 DIAGNOSIS — Z113 Encounter for screening for infections with a predominantly sexual mode of transmission: Secondary | ICD-10-CM | POA: Insufficient documentation

## 2015-01-29 LAB — BASIC METABOLIC PANEL WITH GFR
BUN: 9 mg/dL (ref 7–25)
CO2: 30 mmol/L (ref 20–31)
Calcium: 9.6 mg/dL (ref 8.6–10.2)
Chloride: 100 mmol/L (ref 98–110)
Creat: 0.73 mg/dL (ref 0.50–1.10)
GFR, Est African American: >89 mL/min (ref >=60)
GFR, Est Non African American: >89 mL/min (ref >=60)
Glucose, Bld: 93 mg/dL (ref 65–99)
Potassium: 4.1 mmol/L (ref 3.5–5.3)
Sodium: 137 mmol/L (ref 135–146)

## 2015-01-29 MED ORDER — ERGOCALCIFEROL 1.25 MG (50000 UT) PO CAPS: 50000.0000 [IU] | ORAL_CAPSULE | ORAL | Status: DC

## 2015-01-29 MED ORDER — METFORMIN HCL 500 MG PO TABS: 500.0000 mg | ORAL_TABLET | Freq: Every day | ORAL | Status: DC

## 2015-01-29 NOTE — Assessment & Plan Note (Signed)
After obtaining informed consent, the vaccine is  administered by LPN.  

## 2015-01-29 NOTE — Assessment & Plan Note (Signed)
Specimen sent for testing 

## 2015-01-29 NOTE — Patient Instructions (Signed)
F/u in 4 month, call if you need me before  Flu vaccine today Start once daily metformin to help with change in eating habit and lowering blood sugar, this is sent in Change drinks and cut back sweets please  You are referred to dietician, VERY IMPORTANT that you understand how to eat, aim for 1200 to 1500 calories daily   Exercise at least 30 mins 5 to 7  days a week. If you develop chest pain, have severe difficulty breathing, or feel very tired, stop exercising immediately and seek medical attention   Start once weekly vit D , this is sent in  I expect 10 to 15 pound weight loss, you CAN do this  No more phentermine as discussed  HBA1C , chem 7 and EGFR today

## 2015-01-29 NOTE — Assessment & Plan Note (Signed)

## 2015-01-29 NOTE — Progress Notes (Signed)
   Subjective:    Patient ID: Rose Mcguire, female    DOB: [birth year], 39 y.o.   MRN: [id number]  HPI Patient is in for annual physical exam. No other health concerns are expressed or addressed at the visit. Recent labs, if available are reviewed. Immunization is reviewed , and  updated if needed.   Review of Systems See HPI     Objective:   Physical Exam  BP 124/82 mmHg  Pulse 85  Resp 16  Ht 5\' 4"  (1.626 m)  Wt 275 lb (124.739 kg)  BMI 47.18 kg/m2  SpO2 100% Pleasant obese female, alert and oriented x 3, in no cardio-pulmonary distress. Afebrile. HEENT No facial trauma or asymetry. Sinuses non tender.  Extra occullar muscles intact, pupils equally reactive to light. External ears normal, tympanic membranes clear. Oropharynx moist, no exudate, good dentition. Neck: supple, no adenopathy,JVD or thyromegaly.No bruits.  Chest: Clear to ascultation bilaterally.No crackles or wheezes. Non tender to palpation  Breast: No asymetry,no masses or lumps. No tenderness. No nipple discharge or inversion. No axillary or supraclavicular adenopathy  Cardiovascular system; Heart sounds normal,  S1 and  S2 ,no S3.  No murmur, or thrill. Apical beat not displaced Peripheral pulses normal.  Abdomen: Soft, non tender, no organomegaly or masses. No bruits. Bowel sounds normal. No guarding, tenderness or rebound.  GU: External genitalia normal female genitalia , female distribution of hair. No lesions. Urethral meatus normal in size, no  Prolapse, no lesions visibly  Present. Bladder non tender. Vagina pink and moist , with no visible lesions , discharge present . Adequate pelvic support no  cystocele or rectocele noted Cervix pink and appears healthy, no lesions or ulcerations noted, no discharge noted from os Uterus normal size, no adnexal masses, no cervical motion or adnexal tenderness.   Musculoskeletal exam: Full ROM of spine, hips , shoulders and knees. No  deformity ,swelling or crepitus noted. No muscle wasting or atrophy.   Neurologic: Cranial nerves 2 to 12 intact. Power, tone ,sensation and reflexes normal throughout. No disturbance in gait. No tremor.  Skin: Intact, no ulceration, erythema , scaling or rash noted. Pigmentation normal throughout  Psych; Normal mood and affect. Judgement and concentration normal       Assessment & Plan:  Annual physical exam Annual exam as documented. Counseling done  re healthy lifestyle involving commitment to 150 minutes exercise per week, heart healthy diet, and attaining healthy weight.The importance of adequate sleep also discussed. Regular seat belt use and home safety, is also discussed. Changes in health habits are decided on by the patient with goals and time frames  set for achieving them. Immunization and cancer screening needs are specifically addressed at this visit.   Need for prophylactic vaccination and inoculation against influenza After obtaining informed consent, the vaccine is  administered by LPN.   Vulvovaginitis associated with another disorder Specimen sent for testing

## 2015-01-30 LAB — CYTOLOGY - PAP

## 2015-01-30 LAB — HEMOGLOBIN A1C
Hgb A1c MFr Bld: 6 % — ABNORMAL HIGH (ref <5.7)
Mean Plasma Glucose: 126 mg/dL — ABNORMAL HIGH (ref <117)

## 2015-01-30 LAB — CERVICOVAGINAL ANCILLARY ONLY: Wet Prep (BD Affirm): NEGATIVE

## 2015-03-07 ENCOUNTER — Ambulatory Visit: Payer: 59 | Admitting: Nutrition

## 2015-04-10 ENCOUNTER — Ambulatory Visit: Payer: 59 | Admitting: Nutrition

## 2015-04-24 ENCOUNTER — Telehealth: Payer: Self-pay | Admitting: Nutrition

## 2015-04-24 NOTE — Telephone Encounter (Signed)
VM to call and reschedule missed appt. PC 

## 2015-06-14 ENCOUNTER — Encounter: Payer: 59 | Attending: Family Medicine | Admitting: Nutrition

## 2015-06-14 ENCOUNTER — Encounter: Payer: Self-pay | Admitting: Nutrition

## 2015-06-14 DIAGNOSIS — R7303 Prediabetes: Secondary | ICD-10-CM | POA: Diagnosis not present

## 2015-06-14 DIAGNOSIS — R739 Hyperglycemia, unspecified: Secondary | ICD-10-CM

## 2015-06-14 NOTE — Progress Notes (Signed)
  Medical Nutrition Therapy:  Appt start time: 1030 end time:  1130.  Assessment:  Primary concerns today: Prediabetes and obesity. Just recently started cooking more at home now for three weeks. This most she has ever weighed. Admits to forgetting to take her medication often. She doesn't like taking medicine.  Eats 1- 2 meals per day. Eats out and cooks some at home too. 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 Has felt that she has gained a lot of weight since his death. Appears depressed but not suicidal.  Is undecided if she wants to see a counselor or therapist. Is Prediabetic with A1C 6%. Physical activity: started walking  And doing some dance tapes. Currently on Metformin 500 Mg daily. Admits to drinking soda, sweet tea and eating junk food. Diet is inadequate to meet her nutritional needs.   Lab Results  Component Value Date   HGBA1C 6.0* 01/29/2015    Preferred Learning Style:  AuditoryHands on  No preference indicated   Learning Readiness:  Ready  Change in progress   MEDICATIONS:    DIETARY INTAKE:  24-hr recall:  B ( AM): Skips mostly: OR oatmeal or rice krispies, cheerios or raisin bran, 2% milk Snk ( AM): none OR bag of muchos with Mt Dew L ( PM): skipped; D ( PM): 2 slices pizza and apple juice Snk ( PM): Snack: pretzels or fruit Beverages: Mt dew - 1. Sweet tea, water,  Usual physical activity: walks a little  Estimated energy needs: 1500 calories 170 g carbohydrates 112 g protein 42 g fat  Progress Towards Goal(s):  In progress.   Nutritional Diagnosis:  NB-1.1 Food and nutrition-related knowledge deficit As related to Prediabetes and obesity.  As evidenced by A1C 6% and BMI > 40..    Intervention: Nutrition and Diabetes education provided on My Plate, CHO counting, meal planning, portion sizes, timing of meals, avoiding snacks between meals unless having a low blood sugar, target ranges for A1C and blood sugars, signs/symptoms and  treatment of hyper/hypoglycemia, monitoring blood sugars, taking medications as prescribed, benefits of exercising 30 minutes per day and prevention of complications of DM.  Goals 1. Follow my Plate Method 2. Get pill bottle and fill out weekly. 3. Cu tout sodas and tea 4. Drink only water 5. Increase fresh fruits and vegetables 6. Lose 1-2 lbs per week. 7. Dont skip meals.   Teaching Method Utilized:  Visual Auditory Hands on  Handouts given during visit include:  The Plate Method  Meal Plan Card  Diabetes Instructions   Barriers to learning/adherence to lifestyle change:  None  Demonstrated degree of understanding via:  Teach Back   Monitoring/Evaluation:  Dietary intake, exercise, meal planning, and body weight in 1 month(s).

## 2015-06-14 NOTE — Patient Instructions (Addendum)
Goals 1. Follow my Plate Method 2. Get pill bottle and fill out weekly. 3. Cu tout sodas and tea 4. Drink only water 5. Increase fresh fruits and vegetables 6. Lose 1-2 lbs per week. 7. Dont skip meals. Talk to Dr. Moshe Cipro about referral to grief counselor.

## 2015-06-17 ENCOUNTER — Other Ambulatory Visit: Payer: Self-pay | Admitting: Family Medicine

## 2015-06-17 ENCOUNTER — Encounter: Payer: Self-pay | Admitting: Nutrition

## 2015-06-22 ENCOUNTER — Other Ambulatory Visit: Payer: Self-pay | Admitting: Family Medicine

## 2015-07-10 ENCOUNTER — Ambulatory Visit: Payer: 59 | Admitting: Family Medicine

## 2015-07-22 ENCOUNTER — Ambulatory Visit: Payer: 59 | Admitting: Nutrition

## 2015-08-28 ENCOUNTER — Other Ambulatory Visit: Payer: Self-pay

## 2015-08-28 ENCOUNTER — Other Ambulatory Visit: Payer: Self-pay | Admitting: Family Medicine

## 2015-08-28 ENCOUNTER — Telehealth: Payer: Self-pay | Admitting: Family Medicine

## 2015-08-28 MED ORDER — TRIAMTERENE-HCTZ 37.5-25 MG PO TABS: 1.0000 | ORAL_TABLET | Freq: Every day | ORAL | Status: DC

## 2015-08-28 NOTE — Telephone Encounter (Signed)
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

## 2015-08-28 NOTE — Telephone Encounter (Signed)
Spoke with patient and she is aware that PCP is out of the office.  Advised her to seek care at urgent care and to try ibuprofen and alternating heat with ice.

## 2015-09-12 ENCOUNTER — Ambulatory Visit: Payer: 59 | Admitting: Family Medicine

## 2015-09-12 ENCOUNTER — Encounter: Payer: Self-pay | Admitting: Family Medicine

## 2015-09-25 ENCOUNTER — Ambulatory Visit: Payer: 59 | Admitting: Family Medicine

## 2015-10-23 ENCOUNTER — Ambulatory Visit: Payer: 59 | Admitting: Family Medicine

## 2015-10-30 ENCOUNTER — Ambulatory Visit: Payer: 59 | Admitting: Family Medicine

## 2015-11-28 ENCOUNTER — Ambulatory Visit: Payer: 59 | Admitting: Family Medicine

## 2016-06-03 ENCOUNTER — Ambulatory Visit (INDEPENDENT_AMBULATORY_CARE_PROVIDER_SITE_OTHER): Payer: BLUE CROSS/BLUE SHIELD | Admitting: Family Medicine

## 2016-06-03 DIAGNOSIS — I10 Essential (primary) hypertension: Secondary | ICD-10-CM

## 2016-06-03 DIAGNOSIS — F321 Major depressive disorder, single episode, moderate: Secondary | ICD-10-CM | POA: Diagnosis not present

## 2016-06-03 DIAGNOSIS — R7303 Prediabetes: Secondary | ICD-10-CM

## 2016-06-03 DIAGNOSIS — F5105 Insomnia due to other mental disorder: Secondary | ICD-10-CM

## 2016-06-03 DIAGNOSIS — Z23 Encounter for immunization: Secondary | ICD-10-CM | POA: Diagnosis not present

## 2016-06-03 DIAGNOSIS — F409 Phobic anxiety disorder, unspecified: Secondary | ICD-10-CM

## 2016-06-03 DIAGNOSIS — F4321 Adjustment disorder with depressed mood: Secondary | ICD-10-CM | POA: Diagnosis not present

## 2016-06-03 DIAGNOSIS — E559 Vitamin D deficiency, unspecified: Secondary | ICD-10-CM

## 2016-06-03 MED ORDER — TRIAMTERENE-HCTZ 37.5-25 MG PO TABS: 1.0000 | ORAL_TABLET | Freq: Every day | ORAL | 5 refills | Status: DC

## 2016-06-03 MED ORDER — TEMAZEPAM 7.5 MG PO CAPS: 7.5000 mg | ORAL_CAPSULE | Freq: Every evening | ORAL | 2 refills | Status: DC | PRN

## 2016-06-03 MED ORDER — NORETHINDRONE-ETH ESTRADIOL 1-35 MG-MCG PO TABS: 1.0000 | ORAL_TABLET | Freq: Every day | ORAL | 11 refills | Status: DC

## 2016-06-03 MED ORDER — BUPROPION HCL ER (XL) 150 MG PO TB24: 150.0000 mg | ORAL_TABLET | Freq: Every day | ORAL | 3 refills | Status: DC

## 2016-06-03 NOTE — Patient Instructions (Signed)
F/u in 8 weeks, call if you need me before  New medication wellbutrin one daily for depression , and restoril one at night for sleep  Practice good sleep hygiene  You are referred to therapist, this will help a lot, you need this  Fasting labs tomorrow, CBC, fasting lipid, cmp, HBa1C, TSH and vit D  Flu vaccine today   Blood pressure medication and contraceptives have been refilled  .Work excuse starting today to return next week Monday

## 2016-06-04 DIAGNOSIS — E559 Vitamin D deficiency, unspecified: Secondary | ICD-10-CM | POA: Diagnosis not present

## 2016-06-04 DIAGNOSIS — R7303 Prediabetes: Secondary | ICD-10-CM | POA: Diagnosis not present

## 2016-06-04 DIAGNOSIS — I10 Essential (primary) hypertension: Secondary | ICD-10-CM | POA: Diagnosis not present

## 2016-06-04 LAB — COMPREHENSIVE METABOLIC PANEL
ALT: 9 U/L (ref 6–29)
AST: 10 U/L (ref 10–30)
Albumin: 4 g/dL (ref 3.6–5.1)
Alkaline Phosphatase: 79 U/L (ref 33–115)
BUN: 7 mg/dL (ref 7–25)
CO2: 27 mmol/L (ref 20–31)
Calcium: 9.5 mg/dL (ref 8.6–10.2)
Chloride: 99 mmol/L (ref 98–110)
Creat: 0.76 mg/dL (ref 0.50–1.10)
Glucose, Bld: 88 mg/dL (ref 65–99)
Potassium: 4.2 mmol/L (ref 3.5–5.3)
Sodium: 138 mmol/L (ref 135–146)
Total Bilirubin: 0.4 mg/dL (ref 0.2–1.2)
Total Protein: 7.8 g/dL (ref 6.1–8.1)

## 2016-06-04 LAB — LIPID PANEL
Cholesterol: 157 mg/dL (ref <200)
HDL: 64 mg/dL (ref >50)
LDL Cholesterol: 83 mg/dL (ref <100)
Total CHOL/HDL Ratio: 2.5 Ratio (ref <5.0)
Triglycerides: 49 mg/dL (ref <150)
VLDL: 10 mg/dL (ref <30)

## 2016-06-04 LAB — HEMOGLOBIN A1C
Hgb A1c MFr Bld: 5.8 % — ABNORMAL HIGH (ref <5.7)
Mean Plasma Glucose: 120 mg/dL

## 2016-06-04 LAB — TSH: TSH: 1.95 mIU/L

## 2016-06-04 LAB — CBC
HCT: 37.8 % (ref 35.0–45.0)
Hemoglobin: 12.2 g/dL (ref 11.7–15.5)
MCH: 26 pg — ABNORMAL LOW (ref 27.0–33.0)
MCHC: 32.3 g/dL (ref 32.0–36.0)
MCV: 80.6 fL (ref 80.0–100.0)
MPV: 8.7 fL (ref 7.5–12.5)
Platelets: 461 10*3/uL — ABNORMAL HIGH (ref 140–400)
RBC: 4.69 MIL/uL (ref 3.80–5.10)
RDW: 14.6 % (ref 11.0–15.0)
WBC: 5.8 10*3/uL (ref 3.8–10.8)

## 2016-06-05 ENCOUNTER — Other Ambulatory Visit: Payer: Self-pay | Admitting: Family Medicine

## 2016-06-05 LAB — VITAMIN D 25 HYDROXY (VIT D DEFICIENCY, FRACTURES): Vit D, 25-Hydroxy: 20 ng/mL — ABNORMAL LOW (ref 30–100)

## 2016-06-05 MED ORDER — ERGOCALCIFEROL 1.25 MG (50000 UT) PO CAPS: 50000.0000 [IU] | ORAL_CAPSULE | ORAL | 1 refills | Status: DC

## 2016-06-07 ENCOUNTER — Encounter: Payer: Self-pay | Admitting: Family Medicine

## 2016-06-07 DIAGNOSIS — F321 Major depressive disorder, single episode, moderate: Secondary | ICD-10-CM | POA: Insufficient documentation

## 2016-06-07 DIAGNOSIS — F409 Phobic anxiety disorder, unspecified: Secondary | ICD-10-CM | POA: Insufficient documentation

## 2016-06-07 DIAGNOSIS — F5105 Insomnia due to other mental disorder: Secondary | ICD-10-CM | POA: Insufficient documentation

## 2016-06-07 NOTE — Assessment & Plan Note (Signed)
Untreated needs to commit to prescribed supplement

## 2016-06-07 NOTE — Assessment & Plan Note (Signed)
Deteriorated. Patient re-educated about  the importance of commitment to a  minimum of 150 minutes of exercise per week.  The importance of healthy food choices with portion control discussed. Encouraged to start a food diary, count calories and to consider  joining a support group. Sample diet sheets offered. Goals set by the patient for the next several months.   Weight /BMI 06/14/2015 01/29/2015 10/16/2014  WEIGHT 285 lb 275 lb 280 lb  HEIGHT 5\' 4"  5\' 4"  5\' 4"   BMI 48.9 kg/m2 47.18 kg/m2 48.04 kg/m2

## 2016-06-07 NOTE — Assessment & Plan Note (Signed)
Sleep hygiene reviewed and written information offered also. Prescription sent for  medication needed.  

## 2016-06-07 NOTE — Assessment & Plan Note (Signed)
Pt not suicidal or homicidal , but incapable of normal function. Start medication, referred to psychology for counsel ling and work excuse for 5 days.Adcised will need psychiatric treatment if still unable to function on return to work and requiring longer work excuse

## 2016-06-07 NOTE — Assessment & Plan Note (Signed)
Patient educated about the importance of limiting  Carbohydrate intake , the need to commit to daily physical activity for a minimum of 30 minutes , and to commit weight loss. The fact that changes in all these areas will reduce or eliminate all together the development of diabetes is stressed.  Improving  Diabetic Labs Latest Ref Rng & Units 06/04/2016 01/29/2015 09/07/2014 04/04/2013 07/03/2011  HbA1c <5.7 % 5.8(H) 6.0(H) 5.9(H) 6.0(H) 5.4  Chol <200 mg/dL 157 - 155 149 159  HDL >50 mg/dL 64 - 70 53 65  Calc LDL <100 mg/dL 83 - 75 87 85  Triglycerides <150 mg/dL 49 - 49 46 45  Creatinine 0.50 - 1.10 mg/dL 0.76 0.73 0.76 0.65 0.64   BP/Weight 06/14/2015 01/29/2015 10/16/2014 09/18/2014 09/04/2014 04/04/2013 [id number]  Systolic BP - A999333 Q000111Q Q000111Q [id number]  Diastolic BP - 82 84 88 96 90 86  Wt. (Lbs) 285 275 280 275 281 278.8 278.12  BMI 48.9 47.18 48.04 47.18 48.21 47.83 47.72   No flowsheet data found.

## 2016-06-07 NOTE — Assessment & Plan Note (Signed)
After obtaining informed consent, the vaccine is  administered by LPN.  

## 2016-06-07 NOTE — Progress Notes (Signed)
Rose Mcguire     MRN: [id number]      DOB: 1975-08-22   HPI Rose Mcguire is here for follow up and re-evaluation of chronic medical conditions, medication management and review of any available recent lab and radiology data.  Preventive health is updated, specifically  Cancer screening and Immunization.   Questions or concerns regarding consultations or procedures which the PT has had in the interim are  addressed. The PT denies any adverse reactions to current medications since the last visit.    ROS Denies recent fever or chills. Denies sinus pressure, nasal congestion, ear pain or sore throat. Denies chest congestion, productive cough or wheezing. Denies chest pains, palpitations and leg swelling Denies abdominal pain, nausea, vomiting,diarrhea or constipation.   Denies dysuria, frequency, hesitancy or incontinence. Denies joint pain, swelling and limitation in mobility. Denies headaches, seizures, numbness, or tingling. Denies depression, anxiety or insomnia. Denies skin break down or rash.   PE  There were no vitals taken for this visit.  Patient alert and oriented and in no cardiopulmonary distress.  HEENT: No facial asymmetry, EOMI,   oropharynx pink and moist.  Neck supple no JVD, no mass.  Chest: Clear to auscultation bilaterally.  CVS: S1, S2 no murmurs, no S3.Regular rate.  ABD: Soft non tender.   Ext: No edema  MS: Adequate ROM spine, shoulders, hips and knees.  Skin: Intact, no ulcerations or rash noted.  Psych: Good eye contact, normal affect. Memory intact not anxious tearful and  depressed appearing.  CNS: CN 2-12 intact, power,  normal throughout.no focal deficits noted.   Assessment & Plan  Essential hypertension Controlled, no change in medication DASH diet and commitment to daily physical activity for a minimum of 30 minutes discussed and encouraged, as a part of hypertension management. The importance of attaining a healthy weight is  also discussed.  BP/Weight 06/14/2015 01/29/2015 10/16/2014 09/18/2014 09/04/2014 04/04/2013 [id number]  Systolic BP - A999333 [id number] Q000111Q [id number]  Diastolic BP - 82 84 88 96 90 86  Wt. (Lbs) 285 275 280 275 281 278.8 278.12  BMI 48.9 47.18 48.04 47.18 48.21 47.83 47.72       Morbid obesity Deteriorated. Patient re-educated about  the importance of commitment to a  minimum of 150 minutes of exercise per week.  The importance of healthy food choices with portion control discussed. Encouraged to start a food diary, count calories and to consider  joining a support group. Sample diet sheets offered. Goals set by the patient for the next several months.   Weight /BMI 06/14/2015 01/29/2015 10/16/2014  WEIGHT 285 lb 275 lb 280 lb  HEIGHT 5\' 4"  5\' 4"  5\' 4"   BMI 48.9 kg/m2 47.18 kg/m2 48.04 kg/m2      Need for prophylactic vaccination and inoculation against influenza After obtaining informed consent, the vaccine is  administered by LPN.   Prediabetes Patient educated about the importance of limiting  Carbohydrate intake , the need to commit to daily physical activity for a minimum of 30 minutes , and to commit weight loss. The fact that changes in all these areas will reduce or eliminate all together the development of diabetes is stressed.  Improving  Diabetic Labs Latest Ref Rng & Units 06/04/2016 01/29/2015 09/07/2014 04/04/2013 07/03/2011  HbA1c <5.7 % 5.8(H) 6.0(H) 5.9(H) 6.0(H) 5.4  Chol <200 mg/dL 157 - 155 149 159  HDL >50 mg/dL 64 - 70 53 65  Calc LDL <100 mg/dL 83 - 75 87 85  Triglycerides <  150 mg/dL 49 - 49 46 45  Creatinine 0.50 - 1.10 mg/dL 0.76 0.73 0.76 0.65 0.64   BP/Weight 06/14/2015 01/29/2015 10/16/2014 09/18/2014 09/04/2014 04/04/2013 [id number]  Systolic BP - A999333 Q000111Q Q000111Q [id number]  Diastolic BP - 82 84 88 96 90 86  Wt. (Lbs) 285 275 280 275 281 278.8 278.12  BMI 48.9 47.18 48.04 47.18 48.21 47.83 47.72   No flowsheet data found.    Vitamin D deficiency Untreated needs to  commit to prescribed supplement  Depression, major, single episode, moderate (HCC) Pt not suicidal or homicidal , but incapable of normal function. Start medication, referred to psychology for counsel ling and work excuse for 5 days.Adcised will need psychiatric treatment if still unable to function on return to work and requiring longer work excuse  Insomnia due to anxiety and fear Sleep hygiene reviewed and written information offered also. Prescription sent for  medication needed.

## 2016-06-07 NOTE — Assessment & Plan Note (Signed)
Controlled, no change in medication DASH diet and commitment to daily physical activity for a minimum of 30 minutes discussed and encouraged, as a part of hypertension management. The importance of attaining a healthy weight is also discussed.  BP/Weight 06/14/2015 01/29/2015 10/16/2014 09/18/2014 09/04/2014 04/04/2013 [id number]  Systolic BP - [id number]  Diastolic BP - 82 84 88 96 90 86  Wt. (Lbs) 285 275 280 275 281 278.8 278.12  BMI 48.9 47.18 48.04 47.18 48.21 47.83 47.72

## 2016-07-29 ENCOUNTER — Ambulatory Visit: Payer: BLUE CROSS/BLUE SHIELD | Admitting: Family Medicine

## 2016-09-09 ENCOUNTER — Ambulatory Visit: Payer: BLUE CROSS/BLUE SHIELD | Admitting: Family Medicine

## 2016-09-09 ENCOUNTER — Encounter: Payer: Self-pay | Admitting: Family Medicine

## 2016-10-15 ENCOUNTER — Ambulatory Visit: Payer: BLUE CROSS/BLUE SHIELD | Admitting: Family Medicine

## 2016-10-29 ENCOUNTER — Ambulatory Visit (INDEPENDENT_AMBULATORY_CARE_PROVIDER_SITE_OTHER): Payer: BLUE CROSS/BLUE SHIELD | Admitting: Family Medicine

## 2016-10-29 ENCOUNTER — Encounter: Payer: Self-pay | Admitting: Family Medicine

## 2016-10-29 VITALS — BP 148/98 | HR 84 | Resp 16 | Ht 64.0 in | Wt 294.0 lb

## 2016-10-29 DIAGNOSIS — E559 Vitamin D deficiency, unspecified: Secondary | ICD-10-CM | POA: Diagnosis not present

## 2016-10-29 DIAGNOSIS — I10 Essential (primary) hypertension: Secondary | ICD-10-CM

## 2016-10-29 DIAGNOSIS — R7303 Prediabetes: Secondary | ICD-10-CM

## 2016-10-29 MED ORDER — ERGOCALCIFEROL 1.25 MG (50000 UT) PO CAPS: 50000.0000 [IU] | ORAL_CAPSULE | ORAL | 1 refills | Status: DC

## 2016-10-29 MED ORDER — NORETHINDRONE-ETH ESTRADIOL 1-35 MG-MCG PO TABS: 1.0000 | ORAL_TABLET | Freq: Every day | ORAL | 11 refills | Status: DC

## 2016-10-29 MED ORDER — TRIAMTERENE-HCTZ 37.5-25 MG PO TABS: 1.0000 | ORAL_TABLET | Freq: Every day | ORAL | 3 refills | Status: DC

## 2016-10-29 NOTE — Patient Instructions (Signed)
Nurse bP check in 4 weeks  mD f/u in 3 months   If normal bP then you will start phentermine half tablet daily  Need to resume your blood pressure medication as before  Start once weekly vit D   It is important that you exercise regularly at least 30 minutes 5 times a week. If you develop chest pain, have severe difficulty breathing, or feel very tired, stop exercising immediately and seek medical attention    Call for help with weight loss through healthy lifestyle  Thank you  for choosing [facility]. We consider it a privelige to serve you.  Delivering excellent health care in a caring and  compassionate way is our goal.  Partnering with you,  so that together we can achieve this goal is our strategy.

## 2016-11-01 NOTE — Assessment & Plan Note (Signed)
Patient educated about the importance of limiting  Carbohydrate intake , the need to commit to daily physical activity for a minimum of 30 minutes , and to commit weight loss. The fact that changes in all these areas will reduce or eliminate all together the development of diabetes is stressed.   Diabetic Labs Latest Ref Rng & Units 06/04/2016 01/29/2015 09/07/2014 04/04/2013 07/03/2011  HbA1c <5.7 % 5.8(H) 6.0(H) 5.9(H) 6.0(H) 5.4  Chol <200 mg/dL 157 - 155 149 159  HDL >50 mg/dL 64 - 70 53 65  Calc LDL <100 mg/dL 83 - 75 87 85  Triglycerides <150 mg/dL 49 - 49 46 45  Creatinine 0.50 - 1.10 mg/dL 0.76 0.73 0.76 0.65 0.64   BP/Weight 10/29/2016 06/14/2015 01/29/2015 10/16/2014 09/18/2014 09/04/2014 47/08/2593  Systolic BP 638 - 756 433 295 188 416  Diastolic BP 98 - 82 84 88 96 90  Wt. (Lbs) 294 285 275 280 275 281 278.8  BMI 50.46 48.9 47.18 48.04 47.18 48.21 47.83   No flowsheet data found.

## 2016-11-01 NOTE — Progress Notes (Signed)
Rose Mcguire     MRN: [id number]      DOB: 14-Apr-1976   HPI [name] is here for follow up and re-evaluation of chronic medical conditions, medication management and review of any available recent lab and radiology data.  Preventive health is updated, specifically  Cancer screening and Immunization.   Questions or concerns regarding consultations or procedures which the PT has had in the interim are  addressed. She discontinued her BP meds on her own several months ago, denies any s/e , states BP often in the 140's and above. Concerned about weight gain, interested in appetite suppressant which has worked in the past for her, recently started zumba and is modifying her diet ROS Denies recent fever or chills. Denies sinus pressure, nasal congestion, ear pain or sore throat. Denies chest congestion, productive cough or wheezing. Denies chest pains, palpitations and leg swelling Denies abdominal pain, nausea, vomiting,diarrhea or constipation.   Denies dysuria, frequency, hesitancy or incontinence. Denies joint pain, swelling and limitation in mobility. Denies headaches, seizures, numbness, or tingling. Denies depression, anxiety or insomnia. Denies skin break down or rash.   PE  BP (!) 148/98   Pulse 84   Resp 16   Ht 5\' 4"  (1.626 m)   Wt 294 lb (133.4 kg)   SpO2 97%   BMI 50.46 kg/m   Patient alert and oriented and in no cardiopulmonary distress.  HEENT: No facial asymmetry, EOMI,   oropharynx pink and moist.  Neck supple no JVD, no mass.  Chest: Clear to auscultation bilaterally.  CVS: S1, S2 no murmurs, no S3.Regular rate.  ABD: Soft non tender.   Ext: No edema  MS: Adequate ROM spine, shoulders, hips and knees.  Skin: Intact, no ulcerations or rash noted.  Psych: Good eye contact, normal affect. Memory intact not anxious or depressed appearing.  CNS: CN 2-12 intact, power,  normal throughout.no focal deficits noted.   Assessment & Plan  Essential  hypertension Uncontrolled off of medication, needs to resume same, re educated about this DASH diet and commitment to daily physical activity for a minimum of 30 minutes discussed and encouraged, as a part of hypertension management. The importance of attaining a healthy weight is also discussed.  BP/Weight 10/29/2016 06/14/2015 01/29/2015 10/16/2014 09/18/2014 09/04/2014 02/06/2693  Systolic BP 854 - 627 035 009 381 829  Diastolic BP 98 - 82 84 88 96 90  Wt. (Lbs) 294 285 275 280 275 281 278.8  BMI 50.46 48.9 47.18 48.04 47.18 48.21 47.83       Morbid obesity Deteriorated. Patient re-educated about  the importance of commitment to a  minimum of 150 minutes of exercise per week.  The importance of healthy food choices with portion control discussed. Encouraged to start a food diary, count calories and to consider  joining a support group. Sample diet sheets offered. Goals set by the patient for the next several months.   Weight /BMI 10/29/2016 06/14/2015 01/29/2015  WEIGHT 294 lb 285 lb 275 lb  HEIGHT 5\' 4"  5\' 4"  5\' 4"   BMI 50.46 kg/m2 48.9 kg/m2 47.18 kg/m2      Prediabetes Patient educated about the importance of limiting  Carbohydrate intake , the need to commit to daily physical activity for a minimum of 30 minutes , and to commit weight loss. The fact that changes in all these areas will reduce or eliminate all together the development of diabetes is stressed.   Diabetic Labs Latest Ref Rng & Units 06/04/2016 01/29/2015 09/07/2014  04/04/2013 07/03/2011  HbA1c <5.7 % 5.8(H) 6.0(H) 5.9(H) 6.0(H) 5.4  Chol <200 mg/dL 157 - 155 149 159  HDL >50 mg/dL 64 - 70 53 65  Calc LDL <100 mg/dL 83 - 75 87 85  Triglycerides <150 mg/dL 49 - 49 46 45  Creatinine 0.50 - 1.10 mg/dL 0.76 0.73 0.76 0.65 0.64   BP/Weight 10/29/2016 06/14/2015 01/29/2015 10/16/2014 09/18/2014 09/04/2014 72/0/9470  Systolic BP 962 - 836 629 476 546 503  Diastolic BP 98 - 82 84 88 96 90  Wt. (Lbs) 294 285 275 280 275 281 278.8  BMI  50.46 48.9 47.18 48.04 47.18 48.21 47.83   No flowsheet data found.    Vitamin D deficiency Start vit D supplement

## 2016-11-01 NOTE — Assessment & Plan Note (Signed)
Start vit D supplement

## 2016-11-01 NOTE — Assessment & Plan Note (Signed)
Deteriorated. Patient re-educated about  the importance of commitment to a  minimum of 150 minutes of exercise per week.  The importance of healthy food choices with portion control discussed. Encouraged to start a food diary, count calories and to consider  joining a support group. Sample diet sheets offered. Goals set by the patient for the next several months.   Weight /BMI 10/29/2016 06/14/2015 01/29/2015  WEIGHT 294 lb 285 lb 275 lb  HEIGHT 5\' 4"  5\' 4"  5\' 4"   BMI 50.46 kg/m2 48.9 kg/m2 47.18 kg/m2

## 2016-11-01 NOTE — Assessment & Plan Note (Signed)
Uncontrolled off of medication, needs to resume same, re educated about this DASH diet and commitment to daily physical activity for a minimum of 30 minutes discussed and encouraged, as a part of hypertension management. The importance of attaining a healthy weight is also discussed.  BP/Weight 10/29/2016 06/14/2015 01/29/2015 10/16/2014 09/18/2014 09/04/2014 14/01/9691  Systolic BP 493 - 241 991 444 584 835  Diastolic BP 98 - 82 84 88 96 90  Wt. (Lbs) 294 285 275 280 275 281 278.8  BMI 50.46 48.9 47.18 48.04 47.18 48.21 47.83

## 2016-12-10 ENCOUNTER — Encounter: Payer: Self-pay | Admitting: *Deleted

## 2016-12-17 ENCOUNTER — Ambulatory Visit: Payer: BLUE CROSS/BLUE SHIELD

## 2016-12-31 ENCOUNTER — Ambulatory Visit: Payer: BLUE CROSS/BLUE SHIELD | Admitting: Family Medicine

## 2017-01-19 ENCOUNTER — Ambulatory Visit: Payer: BLUE CROSS/BLUE SHIELD | Admitting: Family Medicine

## 2017-01-21 ENCOUNTER — Telehealth: Payer: Self-pay | Admitting: Family Medicine

## 2017-01-21 ENCOUNTER — Ambulatory Visit: Payer: BLUE CROSS/BLUE SHIELD | Admitting: Family Medicine

## 2017-01-21 NOTE — Telephone Encounter (Signed)
We are unable to help her with this with Dr Chauncey Cruel out of the office.

## 2017-01-21 NOTE — Telephone Encounter (Signed)
Patient is requesting phentermine.  She was not able to do the BP check when she came in last because there was no physician in the office and she was told that she would need to come back.  She will come in for a BP if she is able to get the Rx today.  Please call if this is or is not possible.

## 2017-01-21 NOTE — Telephone Encounter (Signed)
It looks like this was last written in 2016

## 2017-01-21 NOTE — Telephone Encounter (Signed)
[email or web address]

## 2017-02-15 ENCOUNTER — Encounter: Payer: Self-pay | Admitting: Family Medicine

## 2017-02-15 ENCOUNTER — Ambulatory Visit (INDEPENDENT_AMBULATORY_CARE_PROVIDER_SITE_OTHER): Payer: BLUE CROSS/BLUE SHIELD | Admitting: Family Medicine

## 2017-02-15 VITALS — BP 140/94 | HR 84 | Temp 98.9°F | Resp 14 | Ht 64.0 in | Wt 291.5 lb

## 2017-02-15 DIAGNOSIS — Z23 Encounter for immunization: Secondary | ICD-10-CM

## 2017-02-15 DIAGNOSIS — R7303 Prediabetes: Secondary | ICD-10-CM

## 2017-02-15 DIAGNOSIS — I10 Essential (primary) hypertension: Secondary | ICD-10-CM | POA: Diagnosis not present

## 2017-02-15 MED ORDER — PHENTERMINE HCL 37.5 MG PO TABS: 37.5000 mg | ORAL_TABLET | Freq: Every day | ORAL | 1 refills | Status: DC

## 2017-02-15 MED ORDER — ERGOCALCIFEROL 1.25 MG (50000 UT) PO CAPS: 50000.0000 [IU] | ORAL_CAPSULE | ORAL | 1 refills | Status: DC

## 2017-02-15 MED ORDER — TRIAMTERENE-HCTZ 37.5-25 MG PO TABS: ORAL_TABLET | ORAL | 3 refills | Status: DC

## 2017-02-15 NOTE — Assessment & Plan Note (Signed)
Start HALF phentermine daily DASH diet and commitment to daily physical activity for a minimum of 30 minutes discussed and encouraged, as a part of hypertension management. The importance of attaining a healthy weight is also discussed.  BP/Weight 02/15/2017 10/29/2016 06/14/2015 01/29/2015 10/16/2014 6/88/6484 11/03/719  Systolic BP 828 833 - 744 514 604 799  Diastolic BP 94 98 - 82 84 88 96  Wt. (Lbs) 291.5 294 285 275 280 275 281  BMI 50.04 50.46 48.9 47.18 48.04 47.18 48.21  Goal weight loss of 5 pounds per month

## 2017-02-15 NOTE — Assessment & Plan Note (Signed)
Patient educated about the importance of limiting  Carbohydrate intake , the need to commit to daily physical activity for a minimum of 30 minutes , and to commit weight loss. The fact that changes in all these areas will reduce or eliminate all together the development of diabetes is stressed.  Updated lab needed at/ before next visit.   Diabetic Labs Latest Ref Rng & Units 06/04/2016 01/29/2015 09/07/2014 04/04/2013 07/03/2011  HbA1c <5.7 % 5.8(H) 6.0(H) 5.9(H) 6.0(H) 5.4  Chol <200 mg/dL 157 - 155 149 159  HDL >50 mg/dL 64 - 70 53 65  Calc LDL <100 mg/dL 83 - 75 87 85  Triglycerides <150 mg/dL 49 - 49 46 45  Creatinine 0.50 - 1.10 mg/dL 0.76 0.73 0.76 0.65 0.64   BP/Weight 02/15/2017 10/29/2016 06/14/2015 01/29/2015 10/16/2014 6/57/8469 10/04/9526  Systolic BP 413 244 - 010 272 536 644  Diastolic BP 94 98 - 82 84 88 96  Wt. (Lbs) 291.5 294 285 275 280 275 281  BMI 50.04 50.46 48.9 47.18 48.04 47.18 48.21   No flowsheet data found.

## 2017-02-15 NOTE — Patient Instructions (Addendum)
F/u in 4 months, call if you need me before  Flu vaccine today  CBC, chem 7, TSH, and HBa1C 1 week befor follow up fasting please  Increase maxzide to one and a half  Tablets once daily  Start half phentermine once daily  Weight loss goal of 5 pounds per month   Start once weekly vitamin D  Please work on good  health habits so that your health will improve. 1. Commitment to daily physical activity for 30 to 60  minutes, if you are able to do this.  2. Commitment to wise food choices. Aim for half of your  food intake to be vegetable and fruit, one quarter starchy foods, and one quarter protein. Try to eat on a regular schedule  3 meals per day, snacking between meals should be limited to vegetables or fruits or small portions of nuts. 64 ounces of water per day is generally recommended, unless you have specific health conditions, like heart failure or kidney failure where you will need to limit fluid intake.  3. Commitment to sufficient and a  good quality of physical and mental rest daily, generally between 6 to 8 hours per day.  WITH PERSISTANCE AND PERSEVERANCE, THE IMPOSSIBLE , BECOMES THE NORM! It is important that you exercise regularly at least 30 minutes 5 times a week. If you develop chest pain, have severe difficulty breathing, or feel very tired, stop exercising immediately and seek medical attention

## 2017-02-15 NOTE — Assessment & Plan Note (Signed)
Not at goal , incease maxzide to 1.5 tab daily DASH diet and commitment to daily physical activity for a minimum of 30 minutes discussed and encouraged, as a part of hypertension management. The importance of attaining a healthy weight is also discussed.  BP/Weight 02/15/2017 10/29/2016 06/14/2015 01/29/2015 10/16/2014 7/49/4496 11/05/9161  Systolic BP 846 659 - 935 701 779 390  Diastolic BP 94 98 - 82 84 88 96  Wt. (Lbs) 291.5 294 285 275 280 275 281  BMI 50.04 50.46 48.9 47.18 48.04 47.18 48.21

## 2017-02-15 NOTE — Progress Notes (Signed)
Rose Mcguire     MRN: [id number]      DOB: 1975/12/26   HPI [name] is here for follow up and re-evaluation of chronic medical conditions, medication management and review of any available recent lab and radiology data.  Preventive health is updated, specifically   Immunization.    The PT denies any adverse reactions to current medications since the last visit.  Wants to start phentermine and has to have her blood pressure re assessed prior to this. Denies adverse s/e from medication. Has started lifestyle change to facilitate weight loss  ROS Denies recent fever or chills. Denies sinus pressure, nasal congestion, ear pain or sore throat. Denies chest congestion, productive cough or wheezing. Denies chest pains, palpitations and leg swelling Denies abdominal pain, nausea, vomiting,diarrhea or constipation.   Denies dysuria, frequency, hesitancy or incontinence. Denies joint pain, swelling and limitation in mobility. Denies headaches, seizures, numbness, or tingling. Denies depression, anxiety or insomnia. Denies skin break down or rash.   PE  BP (!) 140/94   Pulse 84   Temp 98.9 F (37.2 C) (Other (Comment))   Resp 14   Ht 5\' 4"  (1.626 m)   Wt 291 lb 8 oz (132.2 kg)   SpO2 100%   BMI 50.04 kg/m   Patient alert and oriented and in no cardiopulmonary distress.  HEENT: No facial asymmetry, EOMI,   oropharynx pink and moist.  Neck supple no JVD, no mass.  Chest: Clear to auscultation bilaterally.  CVS: S1, S2 no murmurs, no S3.Regular rate.  ABD: Soft non tender.   Ext: No edema  MS: Adequate ROM spine, shoulders, hips and knees.  Skin: Intact, no ulcerations or rash noted.  Psych: Good eye contact, normal affect. Memory intact not anxious or depressed appearing.  CNS: CN 2-12 intact, power,  normal throughout.no focal deficits noted.   Assessment & Plan Essential hypertension Not at goal , incease maxzide to 1.5 tab daily DASH diet and commitment to  daily physical activity for a minimum of 30 minutes discussed and encouraged, as a part of hypertension management. The importance of attaining a healthy weight is also discussed.  BP/Weight 02/15/2017 10/29/2016 06/14/2015 01/29/2015 10/16/2014 0/97/3532 01/10/2425  Systolic BP 834 196 - 222 979 892 119  Diastolic BP 94 98 - 82 84 88 96  Wt. (Lbs) 291.5 294 285 275 280 275 281  BMI 50.04 50.46 48.9 47.18 48.04 47.18 48.21       Morbid obesity Start HALF phentermine daily DASH diet and commitment to daily physical activity for a minimum of 30 minutes discussed and encouraged, as a part of hypertension management. The importance of attaining a healthy weight is also discussed.  BP/Weight 02/15/2017 10/29/2016 06/14/2015 01/29/2015 10/16/2014 08/19/4079 08/06/8183  Systolic BP 631 497 - 026 378 588 502  Diastolic BP 94 98 - 82 84 88 96  Wt. (Lbs) 291.5 294 285 275 280 275 281  BMI 50.04 50.46 48.9 47.18 48.04 47.18 48.21  Goal weight loss of 5 pounds per month     Prediabetes Patient educated about the importance of limiting  Carbohydrate intake , the need to commit to daily physical activity for a minimum of 30 minutes , and to commit weight loss. The fact that changes in all these areas will reduce or eliminate all together the development of diabetes is stressed.  Updated lab needed at/ before next visit.   Diabetic Labs Latest Ref Rng & Units 06/04/2016 01/29/2015 09/07/2014 04/04/2013 07/03/2011  HbA1c <5.7 %  5.8(H) 6.0(H) 5.9(H) 6.0(H) 5.4  Chol <200 mg/dL 157 - 155 149 159  HDL >50 mg/dL 64 - 70 53 65  Calc LDL <100 mg/dL 83 - 75 87 85  Triglycerides <150 mg/dL 49 - 49 46 45  Creatinine 0.50 - 1.10 mg/dL 0.76 0.73 0.76 0.65 0.64   BP/Weight 02/15/2017 10/29/2016 06/14/2015 01/29/2015 10/16/2014 8/75/6433 06/12/5186  Systolic BP 416 606 - 301 601 093 235  Diastolic BP 94 98 - 82 84 88 96  Wt. (Lbs) 291.5 294 285 275 280 275 281  BMI 50.04 50.46 48.9 47.18 48.04 47.18 48.21   No flowsheet  data found.

## 2017-06-28 ENCOUNTER — Ambulatory Visit (INDEPENDENT_AMBULATORY_CARE_PROVIDER_SITE_OTHER): Payer: BLUE CROSS/BLUE SHIELD | Admitting: Family Medicine

## 2017-06-28 ENCOUNTER — Encounter: Payer: Self-pay | Admitting: Family Medicine

## 2017-06-28 VITALS — BP 142/84 | HR 107 | Resp 16 | Ht 64.0 in | Wt 286.0 lb

## 2017-06-28 DIAGNOSIS — I10 Essential (primary) hypertension: Secondary | ICD-10-CM

## 2017-06-28 DIAGNOSIS — R7303 Prediabetes: Secondary | ICD-10-CM

## 2017-06-28 DIAGNOSIS — E559 Vitamin D deficiency, unspecified: Secondary | ICD-10-CM | POA: Diagnosis not present

## 2017-06-28 MED ORDER — TRIAMTERENE-HCTZ 75-50 MG PO TABS: 1.0000 | ORAL_TABLET | Freq: Every day | ORAL | 5 refills | Status: DC

## 2017-06-28 NOTE — Patient Instructions (Signed)
F/u in 4 months, call if you need me before  10 pound weight loss goal  Lab today  Increase bP med to TWO daily till done, then new med is 75/50 ONE daily triamterene  Half phentermine daily  Congrats on 8 pound weight loss since June

## 2017-06-29 ENCOUNTER — Encounter: Payer: Self-pay | Admitting: Family Medicine

## 2017-06-29 NOTE — Assessment & Plan Note (Signed)
Patient educated about the importance of limiting  Carbohydrate intake , the need to commit to daily physical activity for a minimum of 30 minutes , and to commit weight loss. The fact that changes in all these areas will reduce or eliminate all together the development of diabetes is stressed.  Updated lab needed at/ before next visit.   Diabetic Labs Latest Ref Rng & Units 06/04/2016 01/29/2015 09/07/2014 04/04/2013 07/03/2011  HbA1c <5.7 % 5.8(H) 6.0(H) 5.9(H) 6.0(H) 5.4  Chol <200 mg/dL 157 - 155 149 159  HDL >50 mg/dL 64 - 70 53 65  Calc LDL <100 mg/dL 83 - 75 87 85  Triglycerides <150 mg/dL 49 - 49 46 45  Creatinine 0.50 - 1.10 mg/dL 0.76 0.73 0.76 0.65 0.64   BP/Weight 06/28/2017 02/15/2017 10/29/2016 06/14/2015 01/29/2015 10/16/2014 10/27/6387  Systolic BP 373 428 768 - 115 726 203  Diastolic BP 84 94 98 - 82 84 88  Wt. (Lbs) 286 291.5 294 285 275 280 275  BMI 49.09 50.04 50.46 48.9 47.18 48.04 47.18   No flowsheet data found.

## 2017-06-29 NOTE — Assessment & Plan Note (Addendum)
Improved cxontinue half pohentermine. Patient re-educated about  the importance of commitment to a  minimum of 150 minutes of exercise per week.  The importance of healthy food choices with portion control discussed. Encouraged to start a food diary, count calories and to consider  joining a support group. Sample diet sheets offered. Goals set by the patient for the next several months.   Weight /BMI 06/28/2017 02/15/2017 10/29/2016  WEIGHT 286 lb 291 lb 8 oz 294 lb  HEIGHT 5\' 4"  5\' 4"  5\' 4"   BMI 49.09 kg/m2 50.04 kg/m2 50.46 kg/m2

## 2017-06-29 NOTE — Assessment & Plan Note (Signed)
Updated lab needed at/ before next visit.   

## 2017-06-29 NOTE — Progress Notes (Signed)
Rose Mcguire     MRN: [id number]      DOB: [birth year]   HPI [name] is here for follow up and re-evaluation of chronic medical conditions, medication management and review of any available recent lab and radiology data.  Preventive health is updated, specifically  Cancer screening and Immunization.    The PT denies any adverse reactions to current medications since the last visit.  There are no new concerns.  There are no specific complaints   ROS Denies recent fever or chills. Denies sinus pressure, nasal congestion, ear pain or sore throat. Denies chest congestion, productive cough or wheezing. Denies chest pains, palpitations and leg swelling Denies abdominal pain, nausea, vomiting,diarrhea or constipation.   Denies dysuria, frequency, hesitancy or incontinence. Denies joint pain, swelling and limitation in mobility. Denies headaches, seizures, numbness, or tingling. Denies depression, anxiety or insomnia. Denies skin break down or rash.   PE  BP (!) 142/84   Pulse (!) 107   Resp 16   Ht 5\' 4"  (1.626 m)   Wt 286 lb (129.7 kg)   SpO2 99%   BMI 49.09 kg/m   Patient alert and oriented and in no cardiopulmonary distress.  HEENT: No facial asymmetry, EOMI,   oropharynx pink and moist.  Neck supple no JVD, no mass.  Chest: Clear to auscultation bilaterally.  CVS: S1, S2 no murmurs, no S3.Regular rate.  ABD: Soft non tender.   Ext: No edema  MS: Adequate ROM spine, shoulders, hips and knees.  Skin: Intact, no ulcerations or rash noted.  Psych: Good eye contact, normal affect. Memory intact not anxious or depressed appearing.  CNS: CN 2-12 intact, power,  normal throughout.no focal deficits noted.   Assessment & Plan  Essential hypertension Controlled, no change in medication DASH diet and commitment to daily physical activity for a minimum of 30 minutes discussed and encouraged, as a part of hypertension management. The importance of attaining a  healthy weight is also discussed.  BP/Weight 06/28/2017 02/15/2017 10/29/2016 06/14/2015 01/29/2015 10/16/2014 10/31/5282  Systolic BP 132 440 102 - 725 366 440  Diastolic BP 84 94 98 - 82 84 88  Wt. (Lbs) 286 291.5 294 285 275 280 275  BMI 49.09 50.04 50.46 48.9 47.18 48.04 47.18       Morbid obesity Improved cxontinue half pohentermine. Patient re-educated about  the importance of commitment to a  minimum of 150 minutes of exercise per week.  The importance of healthy food choices with portion control discussed. Encouraged to start a food diary, count calories and to consider  joining a support group. Sample diet sheets offered. Goals set by the patient for the next several months.   Weight /BMI 06/28/2017 02/15/2017 10/29/2016  WEIGHT 286 lb 291 lb 8 oz 294 lb  HEIGHT 5\' 4"  5\' 4"  5\' 4"   BMI 49.09 kg/m2 50.04 kg/m2 50.46 kg/m2      Prediabetes Patient educated about the importance of limiting  Carbohydrate intake , the need to commit to daily physical activity for a minimum of 30 minutes , and to commit weight loss. The fact that changes in all these areas will reduce or eliminate all together the development of diabetes is stressed.  Updated lab needed at/ before next visit.   Diabetic Labs Latest Ref Rng & Units 06/04/2016 01/29/2015 09/07/2014 04/04/2013 07/03/2011  HbA1c <5.7 % 5.8(H) 6.0(H) 5.9(H) 6.0(H) 5.4  Chol <200 mg/dL 157 - 155 149 159  HDL >50 mg/dL 64 - 70 53 65  Calc LDL <100 mg/dL 83 - 75 87 85  Triglycerides <150 mg/dL 49 - 49 46 45  Creatinine 0.50 - 1.10 mg/dL 0.76 0.73 0.76 0.65 0.64   BP/Weight 06/28/2017 02/15/2017 10/29/2016 06/14/2015 01/29/2015 10/16/2014 5/44/9201  Systolic BP 007 121 975 - 883 254 982  Diastolic BP 84 94 98 - 82 84 88  Wt. (Lbs) 286 291.5 294 285 275 280 275  BMI 49.09 50.04 50.46 48.9 47.18 48.04 47.18   No flowsheet data found.    Vitamin D deficiency Updated lab needed at/ before next visit.

## 2017-06-29 NOTE — Assessment & Plan Note (Signed)
Controlled, no change in medication DASH diet and commitment to daily physical activity for a minimum of 30 minutes discussed and encouraged, as a part of hypertension management. The importance of attaining a healthy weight is also discussed.  BP/Weight 06/28/2017 02/15/2017 10/29/2016 06/14/2015 01/29/2015 10/16/2014 9/53/9672  Systolic BP 897 915 041 - 364 383 779  Diastolic BP 84 94 98 - 82 84 88  Wt. (Lbs) 286 291.5 294 285 275 280 275  BMI 49.09 50.04 50.46 48.9 47.18 48.04 47.18

## 2017-10-26 ENCOUNTER — Ambulatory Visit: Payer: BLUE CROSS/BLUE SHIELD | Admitting: Family Medicine

## 2017-11-18 ENCOUNTER — Ambulatory Visit (INDEPENDENT_AMBULATORY_CARE_PROVIDER_SITE_OTHER): Payer: BLUE CROSS/BLUE SHIELD | Admitting: Family Medicine

## 2017-11-18 ENCOUNTER — Other Ambulatory Visit: Payer: Self-pay

## 2017-11-18 ENCOUNTER — Encounter: Payer: Self-pay | Admitting: Family Medicine

## 2017-11-18 VITALS — BP 120/88 | HR 106 | Ht 64.0 in | Wt 272.1 lb

## 2017-11-18 DIAGNOSIS — R7303 Prediabetes: Secondary | ICD-10-CM

## 2017-11-18 DIAGNOSIS — I10 Essential (primary) hypertension: Secondary | ICD-10-CM | POA: Diagnosis not present

## 2017-11-18 DIAGNOSIS — E559 Vitamin D deficiency, unspecified: Secondary | ICD-10-CM

## 2017-11-18 DIAGNOSIS — Z1231 Encounter for screening mammogram for malignant neoplasm of breast: Secondary | ICD-10-CM

## 2017-11-18 MED ORDER — PHENTERMINE HCL 37.5 MG PO TABS
37.5000 mg | ORAL_TABLET | Freq: Every day | ORAL | 1 refills | Status: DC
Start: 2017-11-18 — End: 2018-03-24

## 2017-11-18 NOTE — Patient Instructions (Signed)
CPE and pap in 4 months, call if you need me before  Please schedule mammogram at checkout for October 13 or shortly after    Fasting CBC, lipid, cmp and EGFR, HBA1C, TSH and Vit D today  Congrats on weight loss , focus on good health and do not worry  Thanks for choosing [facility], we consider it a privelige to serve you.  It is important that you exercise regularly at least 30 minutes 5 times a week. If you develop chest pain, have severe difficulty breathing, or feel very tired, stop exercising immediately and seek medical attention

## 2017-11-19 LAB — COMPLETE METABOLIC PANEL WITH GFR
AG Ratio: 1 (calc) (ref 1.0–2.5)
ALT: 7 U/L (ref 6–29)
AST: 9 U/L — ABNORMAL LOW (ref 10–30)
Albumin: 4.1 g/dL (ref 3.6–5.1)
Alkaline phosphatase (APISO): 84 U/L (ref 33–115)
BUN: 8 mg/dL (ref 7–25)
CO2: 29 mmol/L (ref 20–32)
Calcium: 9.9 mg/dL (ref 8.6–10.2)
Chloride: 96 mmol/L — ABNORMAL LOW (ref 98–110)
Creat: 0.76 mg/dL (ref 0.50–1.10)
GFR, Est African American: 115 mL/min/{1.73_m2} (ref >=60)
GFR, Est Non African American: 99 mL/min/{1.73_m2} (ref >=60)
Globulin: 4.3 g/dL (calc) — ABNORMAL HIGH (ref 1.9–3.7)
Glucose, Bld: 89 mg/dL (ref 65–99)
Potassium: 4 mmol/L (ref 3.5–5.3)
Sodium: 135 mmol/L (ref 135–146)
Total Bilirubin: 0.3 mg/dL (ref 0.2–1.2)
Total Protein: 8.4 g/dL — ABNORMAL HIGH (ref 6.1–8.1)

## 2017-11-19 LAB — VITAMIN D 25 HYDROXY (VIT D DEFICIENCY, FRACTURES): Vit D, 25-Hydroxy: 19 ng/mL — ABNORMAL LOW (ref 30–100)

## 2017-11-20 ENCOUNTER — Encounter: Payer: Self-pay | Admitting: Family Medicine

## 2017-11-20 MED ORDER — ERGOCALCIFEROL 1.25 MG (50000 UT) PO CAPS: 50000.0000 [IU] | ORAL_CAPSULE | ORAL | 3 refills | Status: DC

## 2017-11-20 NOTE — Assessment & Plan Note (Signed)
Controlled, no change in medication DASH diet and commitment to daily physical activity for a minimum of 30 minutes discussed and encouraged, as a part of hypertension management. The importance of attaining a healthy weight is also discussed.  BP/Weight 11/18/2017 06/28/2017 02/15/2017 10/29/2016 06/14/2015 01/29/2015 09/08/2255  Systolic BP 505 183 358 251 - 898 421  Diastolic BP 88 84 94 98 - 82 84  Wt. (Lbs) 272.08 286 291.5 294 285 275 280  BMI 46.7 49.09 50.04 50.46 48.9 47.18 48.04

## 2017-11-20 NOTE — Assessment & Plan Note (Signed)
Not at goal , hasnot been taking medication needs to start again will prescribe

## 2017-11-20 NOTE — Assessment & Plan Note (Signed)
Patient educated about the importance of limiting  Carbohydrate intake , the need to commit to daily physical activity for a minimum of 30 minutes , and to commit weight loss. The fact that changes in all these areas will reduce or eliminate all together the development of diabetes is stressed.  Updated lab needed   Diabetic Labs Latest Ref Rng & Units 11/18/2017 06/04/2016 01/29/2015 09/07/2014 04/04/2013  HbA1c <5.7 % - 5.8(H) 6.0(H) 5.9(H) 6.0(H)  Chol <200 mg/dL - 157 - 155 149  HDL >50 mg/dL - 64 - 70 53  Calc LDL <100 mg/dL - 83 - 75 87  Triglycerides <150 mg/dL - 49 - 49 46  Creatinine 0.50 - 1.10 mg/dL 0.76 0.76 0.73 0.76 0.65   BP/Weight 11/18/2017 06/28/2017 02/15/2017 10/29/2016 06/14/2015 01/29/2015 5/36/1443  Systolic BP 154 008 676 195 - 093 267  Diastolic BP 88 84 94 98 - 82 84  Wt. (Lbs) 272.08 286 291.5 294 285 275 280  BMI 46.7 49.09 50.04 50.46 48.9 47.18 48.04   No flowsheet data found.

## 2017-11-20 NOTE — Assessment & Plan Note (Signed)
Marked improvement, she is applauded on this and is encouraged to continue current  Patient re-educated about  the importance of commitment to a  minimum of 150 minutes of exercise per week.  The importance of healthy food choices with portion control discussed. Encouraged to start a food diary, count calories and to consider  joining a support group. Sample diet sheets offered. Goals set by the patient for the next several months.   Weight /BMI 11/18/2017 06/28/2017 02/15/2017  WEIGHT 272 lb 1.3 oz 286 lb 291 lb 8 oz  HEIGHT 5\' 4"  5\' 4"  5\' 4"   BMI 46.7 kg/m2 49.09 kg/m2 50.04 kg/m2

## 2017-11-20 NOTE — Progress Notes (Signed)
Rose Mcguire     MRN: [id number]      DOB: 1975/07/28   HPI Rose Mcguire is here for follow up and re-evaluation of chronic medical conditions, medication management and review of any available recent lab and radiology data.  Preventive health is updated, specifically  Cancer screening and Immunization.   The PT denies any adverse reactions to current medications since the last visit.  Has been diligent with change in food choice and exercise with good weight loss success Concerned in that she recently had a cousin diagnosed at age 14 with colon cancer ROS Denies recent fever or chills. Denies sinus pressure, nasal congestion, ear pain or sore throat. Denies chest congestion, productive cough or wheezing. Denies chest pains, palpitations and leg swelling Denies abdominal pain, nausea, vomiting,diarrhea or constipation.   Denies dysuria, frequency, hesitancy or incontinence. Denies joint pain, swelling and limitation in mobility. Denies headaches, seizures, numbness, or tingling. Denies depression, anxiety or insomnia. Denies skin break down or rash.   PE  BP 120/88 (BP Location: Left Arm, Patient Position: Sitting, Cuff Size: Large)   Pulse (!) 106   Ht 5\' 4"  (1.626 m)   Wt 272 lb 1.3 oz (123.4 kg)   SpO2 100%   BMI 46.70 kg/m   Patient alert and oriented and in no cardiopulmonary distress.  HEENT: No facial asymmetry, EOMI,   oropharynx pink and moist.  Neck supple no JVD, no mass.  Chest: Clear to auscultation bilaterally.  CVS: S1, S2 no murmurs, no S3.Regular rate.  ABD: Soft non tender.   Ext: No edema  MS: Adequate ROM spine, shoulders, hips and knees.  Skin: Intact, no ulcerations or rash noted.  Psych: Good eye contact, normal affect. Memory intact not anxious or depressed appearing.  CNS: CN 2-12 intact, power,  normal throughout.no focal deficits noted.   Assessment & Plan  Essential hypertension Controlled, no change in medication DASH diet  and commitment to daily physical activity for a minimum of 30 minutes discussed and encouraged, as a part of hypertension management. The importance of attaining a healthy weight is also discussed.  BP/Weight 11/18/2017 06/28/2017 02/15/2017 10/29/2016 06/14/2015 01/29/2015 9/93/5701  Systolic BP 779 390 300 923 - 300 762  Diastolic BP 88 84 94 98 - 82 84  Wt. (Lbs) 272.08 286 291.5 294 285 275 280  BMI 46.7 49.09 50.04 50.46 48.9 47.18 48.04       Vitamin D deficiency Not at goal , hasnot been taking medication needs to start again will prescribe  Morbid obesity Marked improvement, she is applauded on this and is encouraged to continue current  Patient re-educated about  the importance of commitment to a  minimum of 150 minutes of exercise per week.  The importance of healthy food choices with portion control discussed. Encouraged to start a food diary, count calories and to consider  joining a support group. Sample diet sheets offered. Goals set by the patient for the next several months.   Weight /BMI 11/18/2017 06/28/2017 02/15/2017  WEIGHT 272 lb 1.3 oz 286 lb 291 lb 8 oz  HEIGHT 5\' 4"  5\' 4"  5\' 4"   BMI 46.7 kg/m2 49.09 kg/m2 50.04 kg/m2      Prediabetes Patient educated about the importance of limiting  Carbohydrate intake , the need to commit to daily physical activity for a minimum of 30 minutes , and to commit weight loss. The fact that changes in all these areas will reduce or eliminate all together the development of  diabetes is stressed.  Updated lab needed   Diabetic Labs Latest Ref Rng & Units 11/18/2017 06/04/2016 01/29/2015 09/07/2014 04/04/2013  HbA1c <5.7 % - 5.8(H) 6.0(H) 5.9(H) 6.0(H)  Chol <200 mg/dL - 157 - 155 149  HDL >50 mg/dL - 64 - 70 53  Calc LDL <100 mg/dL - 83 - 75 87  Triglycerides <150 mg/dL - 49 - 49 46  Creatinine 0.50 - 1.10 mg/dL 0.76 0.76 0.73 0.76 0.65   BP/Weight 11/18/2017 06/28/2017 02/15/2017 10/29/2016 06/14/2015 01/29/2015 10/01/1038  Systolic BP  459 136 859 923 - 414 436  Diastolic BP 88 84 94 98 - 82 84  Wt. (Lbs) 272.08 286 291.5 294 285 275 280  BMI 46.7 49.09 50.04 50.46 48.9 47.18 48.04   No flowsheet data found.

## 2017-11-22 ENCOUNTER — Telehealth: Payer: Self-pay

## 2017-11-22 ENCOUNTER — Telehealth: Payer: Self-pay | Admitting: Family Medicine

## 2017-11-22 DIAGNOSIS — Z13220 Encounter for screening for lipoid disorders: Secondary | ICD-10-CM

## 2017-11-22 DIAGNOSIS — R7303 Prediabetes: Secondary | ICD-10-CM

## 2017-11-22 DIAGNOSIS — I10 Essential (primary) hypertension: Secondary | ICD-10-CM

## 2017-11-22 NOTE — Telephone Encounter (Signed)
Talked with patient and she will go back and have additional blood drawn.  I told her NPO.

## 2017-11-22 NOTE — Telephone Encounter (Signed)
Lab order corrected

## 2018-02-14 ENCOUNTER — Ambulatory Visit (HOSPITAL_COMMUNITY)
Admission: RE | Admit: 2018-02-14 | Discharge: 2018-02-14 | Disposition: A | Discharge status: Home / Self Care | Payer: BLUE CROSS/BLUE SHIELD | Source: Ambulatory Visit | Attending: Family Medicine | Admitting: Family Medicine

## 2018-02-14 DIAGNOSIS — Z1231 Encounter for screening mammogram for malignant neoplasm of breast: Secondary | ICD-10-CM | POA: Insufficient documentation

## 2018-03-24 ENCOUNTER — Ambulatory Visit (INDEPENDENT_AMBULATORY_CARE_PROVIDER_SITE_OTHER): Payer: BLUE CROSS/BLUE SHIELD | Admitting: Family Medicine

## 2018-03-24 ENCOUNTER — Other Ambulatory Visit (HOSPITAL_COMMUNITY)
Admission: RE | Admit: 2018-03-24 | Discharge: 2018-03-24 | Disposition: A | Discharge status: Home / Self Care | Payer: BLUE CROSS/BLUE SHIELD | Source: Ambulatory Visit | Attending: Family Medicine | Admitting: Family Medicine

## 2018-03-24 ENCOUNTER — Encounter: Payer: Self-pay | Admitting: Family Medicine

## 2018-03-24 VITALS — BP 134/84 | HR 84 | Resp 16 | Ht 64.0 in | Wt 282.0 lb

## 2018-03-24 DIAGNOSIS — Z124 Encounter for screening for malignant neoplasm of cervix: Secondary | ICD-10-CM | POA: Diagnosis not present

## 2018-03-24 DIAGNOSIS — Z23 Encounter for immunization: Secondary | ICD-10-CM | POA: Diagnosis not present

## 2018-03-24 DIAGNOSIS — Z0000 Encounter for general adult medical examination without abnormal findings: Secondary | ICD-10-CM | POA: Diagnosis not present

## 2018-03-24 DIAGNOSIS — N760 Acute vaginitis: Secondary | ICD-10-CM

## 2018-03-24 NOTE — Patient Instructions (Signed)
F/u in 4 months, call if you need me before  Please get fasting labs tomorrow, collect sheet at [place], you have gained 10 pounds, you will lose them in the next 4 months, and 4 more!!

## 2018-03-24 NOTE — Progress Notes (Signed)
    Rose Mcguire     MRN: [id number]      DOB: 02/22/1976  HPI: Patient is in for annual physical exam. No other health concerns are expressed or addressed at the visit. Recent labs, if available are reviewed. Immunization is reviewed , and  updated    PE: BP 134/84   Pulse 84   Resp 16   Ht 5\' 4"  (1.626 m)   Wt 282 lb (127.9 kg)   LMP 02/20/2018 (Approximate)   SpO2 98%   BMI 48.41 kg/m   Pleasant  female, alert and oriented x 3, in no cardio-pulmonary distress. Afebrile. HEENT No facial trauma or asymetry. Sinuses non tender.  Extra occullar muscles intact, pupils equally reactive to light. External ears normal, tympanic membranes clear. Oropharynx moist, no exudate. Neck: supple, no adenopathy,JVD or thyromegaly.No bruits.  Chest: Clear to ascultation bilaterally.No crackles or wheezes. Non tender to palpation  Breast: No asymetry,no masses or lumps. No tenderness. No nipple discharge or inversion. No axillary or supraclavicular adenopathy  Cardiovascular system; Heart sounds normal,  S1 and  S2 ,no S3.  No murmur, or thrill. Apical beat not displaced Peripheral pulses normal.  Abdomen: Soft, non tender, no organomegaly or masses. No bruits. Bowel sounds normal. No guarding, tenderness or rebound.   GU: External genitalia normal female genitalia , normal female distribution of hair. No lesions. Urethral meatus normal in size, no  Prolapse, no lesions visibly  Present. Bladder non tender. Vagina pink and moist , with no visible lesions ,FISHY  discharge present . Adequate pelvic support no  cystocele or rectocele noted Cervix pink and appears healthy, no lesions or ulcerations noted, no discharge noted from os Uterus normal size, no adnexal masses, no cervical motion or adnexal tenderness.   Musculoskeletal exam: Full ROM of spine, hips , shoulders and knees. No deformity ,swelling or crepitus noted. No muscle wasting or atrophy.    Neurologic: Cranial nerves 2 to 12 intact. Power, tone ,sensation and reflexes normal throughout. No disturbance in gait. No tremor.  Skin: Intact, no ulceration, erythema , scaling or rash noted. Pigmentation normal throughout  Psych; Normal mood and affect. Judgement and concentration normal   Assessment & Plan:  Annual physical exam Annual exam as documented. Counseling done  re healthy lifestyle involving commitment to 150 minutes exercise per week, heart healthy diet, and attaining healthy weight.The importance of adequate sleep also discussed. Regular seat belt use and home safety, is also discussed. Changes in health habits are decided on by the patient with goals and time frames  set for achieving them. Immunization and cancer screening needs are specifically addressed at this visit.   Vaginitis and vulvovaginitis VAGINAL D/S WITH FISHY ODOUR, SEBND FOR TESTING THEN TREAT  Morbid obesity Deteriorated. Patient re-educated about  the importance of commitment to a  minimum of 150 minutes of exercise per week.  The importance of healthy food choices with portion control discussed. Encouraged to start a food diary, count calories and to consider  joining a support group. Sample diet sheets offered. Goals set by the patient for the next several months.   Weight /BMI 03/24/2018 11/18/2017 06/28/2017  WEIGHT 282 lb 272 lb 1.3 oz 286 lb  HEIGHT 5\' 4"  5\' 4"  5\' 4"   BMI 48.41 kg/m2 46.7 kg/m2 49.09 kg/m2

## 2018-03-25 LAB — CERVICOVAGINAL ANCILLARY ONLY
Bacterial vaginitis: POSITIVE — AB
Candida vaginitis: NEGATIVE
Chlamydia: NEGATIVE
Neisseria Gonorrhea: NEGATIVE
Trichomonas: NEGATIVE

## 2018-03-26 ENCOUNTER — Other Ambulatory Visit: Payer: Self-pay | Admitting: Family Medicine

## 2018-03-26 MED ORDER — METRONIDAZOLE 500 MG PO TABS: 500.0000 mg | ORAL_TABLET | Freq: Two times a day (BID) | ORAL | 0 refills | Status: DC

## 2018-03-29 LAB — CYTOLOGY - PAP
Diagnosis: NEGATIVE
HPV: NOT DETECTED

## 2018-03-30 ENCOUNTER — Encounter: Payer: Self-pay | Admitting: Family Medicine

## 2018-03-30 NOTE — Assessment & Plan Note (Signed)
VAGINAL D/S WITH FISHY ODOUR, SEBND FOR TESTING THEN TREAT

## 2018-03-30 NOTE — Assessment & Plan Note (Signed)

## 2018-03-30 NOTE — Assessment & Plan Note (Signed)
After obtaining informed consent, the vaccine is  administered 

## 2018-03-30 NOTE — Assessment & Plan Note (Signed)
Deteriorated. Patient re-educated about  the importance of commitment to a  minimum of 150 minutes of exercise per week.  The importance of healthy food choices with portion control discussed. Encouraged to start a food diary, count calories and to consider  joining a support group. Sample diet sheets offered. Goals set by the patient for the next several months.   Weight /BMI 03/24/2018 11/18/2017 06/28/2017  WEIGHT 282 lb 272 lb 1.3 oz 286 lb  HEIGHT 5\' 4"  5\' 4"  5\' 4"   BMI 48.41 kg/m2 46.7 kg/m2 49.09 kg/m2

## 2018-04-05 ENCOUNTER — Encounter: Payer: Self-pay | Admitting: Family Medicine

## 2018-06-15 ENCOUNTER — Telehealth: Payer: Self-pay | Admitting: *Deleted

## 2018-06-15 ENCOUNTER — Other Ambulatory Visit: Payer: Self-pay | Admitting: Family Medicine

## 2018-06-15 MED ORDER — PHENTERMINE HCL 37.5 MG PO TABS: 37.5000 mg | ORAL_TABLET | Freq: Every day | ORAL | 1 refills | Status: DC

## 2018-06-15 NOTE — Telephone Encounter (Signed)
Medication is prescribedm, needs to follow healthy food choice and keep March appt

## 2018-06-15 NOTE — Telephone Encounter (Signed)
Pt called needing a refill on phentermine as she is out of the medication. Can be sent to [facility]

## 2018-06-16 NOTE — Telephone Encounter (Signed)
Advised patient script has been filled

## 2018-07-25 ENCOUNTER — Ambulatory Visit (INDEPENDENT_AMBULATORY_CARE_PROVIDER_SITE_OTHER): Payer: BLUE CROSS/BLUE SHIELD | Admitting: Family Medicine

## 2018-07-25 DIAGNOSIS — N760 Acute vaginitis: Secondary | ICD-10-CM

## 2018-07-25 DIAGNOSIS — I10 Essential (primary) hypertension: Secondary | ICD-10-CM

## 2018-07-25 DIAGNOSIS — R7303 Prediabetes: Secondary | ICD-10-CM | POA: Diagnosis not present

## 2018-07-25 MED ORDER — PHENTERMINE HCL 37.5 MG PO TABS: 37.5000 mg | ORAL_TABLET | Freq: Every day | ORAL | 0 refills | Status: DC

## 2018-07-25 MED ORDER — METRONIDAZOLE 0.75 % VA GEL: 1.0000 | Freq: Two times a day (BID) | VAGINAL | 0 refills | Status: DC

## 2018-07-25 NOTE — Progress Notes (Signed)
Virtual Visit via Telephone Note  I connected with Arthur Holms on 07/25/18 at  3:20 PM EDT by telephone and verified that I am speaking with the correct person using two identifiers.Elenora Gamma was at her home and I was in the office  Nurse M Wingate reviewed medication    I discussed the limitations, risks, security and privacy concerns of performing an evaluation and management service by telephone and the availability of in person appointments. I also discussed with the patient that there may be a patient responsible charge related to this service. The patient expressed understanding and agreed to proceed.   History of Present Illness: F/u chronic problems, hypertension  And morbid  Obesity Thinks that she yo yo's as far as diet and exercise is concerned and feels challenged as far as finding the correct snacks DEnies adverse s/e from the phentermine which she takes half daily , reports that she also starts and stops as far as exercise is concerned, which she knows is not beneficial     Observations/Objective: Weight 270 lb on patient's scale and in 03/2018 she was 282, so there is improvement . ENT: Denies nasal congestion, sinus pressure , sore throat or ear pain  RESP: Denies cough, sputum production , shortness of breath or wheezing  CVS: Denies chest pain, palpitation , leg swelling or orthopnea  GI:  Denies abdominal pain, nausea, vomiting , diarrhea or constipation  GU: Denies dysuria , frequency or incontinence  MS: denies joint pain, swelling or instability  SKIN: Denies rash or skin breakdown  PSYCH: Denies depression, anxiety  or insomnia    Assessment and Plan: Morbid obesity Improved, continue current phentermine dose, follow up in office with weight in next 3.5 months Patient re-educated about  the importance of commitment to a  minimum of 150 minutes of exercise per week as able.  The importance of healthy food choices with portion control discussed, as  well as eating regularly and within a 12 hour window most days. The need to choose "clean , green" food 50 to 75% of the time is discussed, as well as to make water the primary drink and set a goal of 64 ounces water daily.  Encouraged to start a food diary,  and to consider  joining a support group. Sample diet sheets offered. Goals set by the patient for the next several months.   Weight /BMI 03/24/2018 11/18/2017 06/28/2017  WEIGHT 282 lb 272 lb 1.3 oz 286 lb  HEIGHT 5\' 4"  5\' 4"  5\' 4"   BMI 48.41 kg/m2 46.7 kg/m2 49.09 kg/m2      Essential hypertension Controlled when last checked, check in 3.5 months with weight and BP check at least No medication change  DASH diet and commitment to daily physical activity for a minimum of 30 minutes discussed and encouraged, as a part of hypertension management. The importance of attaining a healthy weight is also discussed.  BP/Weight 03/24/2018 11/18/2017 06/28/2017 02/15/2017 10/29/2016 06/14/2015 8/91/6945  Systolic BP 038 882 800 349 179 - 150  Diastolic BP 84 88 84 94 98 - 82  Wt. (Lbs) 282 272.08 286 291.5 294 285 275  BMI 48.41 46.7 49.09 50.04 50.46 48.9 47.18       Prediabetes Patient educated about the importance of limiting  Carbohydrate intake , the need to commit to daily physical activity for a minimum of 30 minutes , and to commit weight loss. The fact that changes in all these areas will reduce or eliminate all together the  development of diabetes is stressed. Updated lab needed at/ before next visit.    Diabetic Labs Latest Ref Rng & Units 11/18/2017 06/04/2016 01/29/2015 09/07/2014 04/04/2013  HbA1c <5.7 % - 5.8(H) 6.0(H) 5.9(H) 6.0(H)  Chol <200 mg/dL - 157 - 155 149  HDL >50 mg/dL - 64 - 70 53  Calc LDL <100 mg/dL - 83 - 75 87  Triglycerides <150 mg/dL - 49 - 49 46  Creatinine 0.50 - 1.10 mg/dL 0.76 0.76 0.73 0.76 0.65   BP/Weight 03/24/2018 11/18/2017 06/28/2017 02/15/2017 10/29/2016 06/14/2015 4/85/4627  Systolic BP 035 009 381  829 937 - 169  Diastolic BP 84 88 84 94 98 - 82  Wt. (Lbs) 282 272.08 286 291.5 294 285 275  BMI 48.41 46.7 49.09 50.04 50.46 48.9 47.18   No flowsheet data found.    Vaginitis and vulvovaginitis BV confirmed since 03/2018, never filled script , wants this re sent though not very symptomatic, same done Denies douching    Follow Up Instructions:    I discussed the assessment and treatment plan with the patient. The patient was provided an opportunity to ask questions and all were answered. The patient agreed with the plan and demonstrated an understanding of the instructions.   The patient was advised to call back or seek an in-person evaluation if the symptoms worsen or if the condition fails to improve as anticipated.  I provided 30 minutes of non-face-to-face time during this encounter.   Tula Nakayama, MD

## 2018-07-26 ENCOUNTER — Encounter: Payer: Self-pay | Admitting: Family Medicine

## 2018-07-26 NOTE — Patient Instructions (Signed)
F/U in 4 months, needs to come in for weight and blood pressure check  Medication is sent for infection   Please get these labs fasting 4 to 7 days before next visit, CBC, lipd,cmp and EGFr, HBA1C, TSH and Vit D( lab order is enclosed)  Congrats on reported weight loss, keep it up, 1 to 2 pounds / week is healthy  It is important that you exercise regularly at least 30 minutes 5 times a week. If you develop chest pain, have severe difficulty breathing, or feel very tired, stop exercising immediately and seek medical attention   Think about what you will eat, plan ahead. Choose " clean, green, fresh or frozen" over canned, processed or packaged foods which are more sugary, salty and fatty. 70 to 75% of food eaten should be vegetables and fruit. Three meals at set times with snacks allowed between meals, but they must be fruit or vegetables. Aim to eat over a 12 hour period , example 7 am to 7 pm, and STOP after  your last meal of the day. Drink water,generally about 64 ounces per day, no other drink is as healthy. Fruit juice is best enjoyed in a healthy way, by EATING the fruit.

## 2018-07-26 NOTE — Assessment & Plan Note (Signed)
Patient educated about the importance of limiting  Carbohydrate intake , the need to commit to daily physical activity for a minimum of 30 minutes , and to commit weight loss. The fact that changes in all these areas will reduce or eliminate all together the development of diabetes is stressed. Updated lab needed at/ before next visit.    Diabetic Labs Latest Ref Rng & Units 11/18/2017 06/04/2016 01/29/2015 09/07/2014 04/04/2013  HbA1c <5.7 % - 5.8(H) 6.0(H) 5.9(H) 6.0(H)  Chol <200 mg/dL - 157 - 155 149  HDL >50 mg/dL - 64 - 70 53  Calc LDL <100 mg/dL - 83 - 75 87  Triglycerides <150 mg/dL - 49 - 49 46  Creatinine 0.50 - 1.10 mg/dL 0.76 0.76 0.73 0.76 0.65   BP/Weight 03/24/2018 11/18/2017 06/28/2017 02/15/2017 10/29/2016 06/14/2015 12/31/5619  Systolic BP 308 657 846 962 952 - 841  Diastolic BP 84 88 84 94 98 - 82  Wt. (Lbs) 282 272.08 286 291.5 294 285 275  BMI 48.41 46.7 49.09 50.04 50.46 48.9 47.18   No flowsheet data found.

## 2018-07-26 NOTE — Assessment & Plan Note (Addendum)
Controlled when last checked, check in 3.5 months with weight and BP check at least No medication change  DASH diet and commitment to daily physical activity for a minimum of 30 minutes discussed and encouraged, as a part of hypertension management. The importance of attaining a healthy weight is also discussed.  BP/Weight 03/24/2018 11/18/2017 06/28/2017 02/15/2017 10/29/2016 06/14/2015 9/83/3825  Systolic BP 053 976 734 193 790 - 240  Diastolic BP 84 88 84 94 98 - 82  Wt. (Lbs) 282 272.08 286 291.5 294 285 275  BMI 48.41 46.7 49.09 50.04 50.46 48.9 47.18

## 2018-07-26 NOTE — Assessment & Plan Note (Signed)
BV confirmed since 03/2018, never filled script , wants this re sent though not very symptomatic, same done Denies douching

## 2018-07-26 NOTE — Assessment & Plan Note (Addendum)
Improved, continue current phentermine dose, follow up in office with weight in next 3.5 months Patient re-educated about  the importance of commitment to a  minimum of 150 minutes of exercise per week as able.  The importance of healthy food choices with portion control discussed, as well as eating regularly and within a 12 hour window most days. The need to choose "clean , green" food 50 to 75% of the time is discussed, as well as to make water the primary drink and set a goal of 64 ounces water daily.  Encouraged to start a food diary,  and to consider  joining a support group. Sample diet sheets offered. Goals set by the patient for the next several months.   Weight /BMI 03/24/2018 11/18/2017 06/28/2017  WEIGHT 282 lb 272 lb 1.3 oz 286 lb  HEIGHT 5\' 4"  5\' 4"  5\' 4"   BMI 48.41 kg/m2 46.7 kg/m2 49.09 kg/m2

## 2018-07-27 ENCOUNTER — Telehealth: Payer: Self-pay

## 2018-07-27 DIAGNOSIS — E559 Vitamin D deficiency, unspecified: Secondary | ICD-10-CM

## 2018-07-27 DIAGNOSIS — Z13220 Encounter for screening for lipoid disorders: Secondary | ICD-10-CM

## 2018-07-27 DIAGNOSIS — I10 Essential (primary) hypertension: Secondary | ICD-10-CM

## 2018-07-27 DIAGNOSIS — R7303 Prediabetes: Secondary | ICD-10-CM

## 2018-07-27 NOTE — Telephone Encounter (Signed)
Labs ordered per AVS

## 2018-11-14 DIAGNOSIS — H5213 Myopia, bilateral: Secondary | ICD-10-CM | POA: Diagnosis not present

## 2018-11-14 DIAGNOSIS — H52223 Regular astigmatism, bilateral: Secondary | ICD-10-CM | POA: Diagnosis not present

## 2018-11-14 DIAGNOSIS — H40013 Open angle with borderline findings, low risk, bilateral: Secondary | ICD-10-CM | POA: Diagnosis not present

## 2018-11-14 DIAGNOSIS — H1045 Other chronic allergic conjunctivitis: Secondary | ICD-10-CM | POA: Diagnosis not present

## 2018-11-14 DIAGNOSIS — H04123 Dry eye syndrome of bilateral lacrimal glands: Secondary | ICD-10-CM | POA: Diagnosis not present

## 2018-11-14 DIAGNOSIS — H33323 Round hole, bilateral: Secondary | ICD-10-CM | POA: Diagnosis not present

## 2018-11-15 NOTE — Progress Notes (Signed)
[facility] Note  11/16/2018     CHIEF COMPLAINT Patient presents for Retina Evaluation   HISTORY OF PRESENT ILLNESS: Rose Mcguire is a 43 y.o. female who presents to the clinic today for:   HPI    Retina Evaluation    In both eyes.  Onset: Unknown.  Duration of 2 days.  Context:  distance vision, mid-range vision and near vision.  Treatments tried include no treatments.  I, the attending physician,  performed the HPI with the patient and updated documentation appropriately.          Comments    43 y/o female pt referred by A. Lindquist for eval of asymptomatic retinal holes OU.  Was seen at Dr. Zenia Resides office 2 days ago.  Pt was there for routine exam, as it had been several years since she had a CEE.  No issues with vision OU cc.  Denies pain, flashes, floaters.  Visine prn OU, but has not used in a while.       Last edited by Bernarda Caffey, MD on 11/16/2018 12:24 PM. (History)    pt was sent here by Dr. Shirley Muscat for possible retinal holes, she states she went to see him for a routine eye exam, she states she has been wearing glasses since the second grade, but she has never had any other eye problems  Referring physician: Clent Jacks, MD [address] [place],  [place] [address]  HISTORICAL INFORMATION:   Selected notes from the MEDICAL RECORD NUMBER Referred by Shirleen Schirmer, PA-C for concern of retinal holes LEE: 7.13.20 (A. 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 prednisoLONE acetate (PRED FORTE) 1 % ophthalmic suspension Place 1 drop into the right eye 4 (four) times daily for 7 days.   No current facility-administered medications for this visit.  (Ophthalmic Drugs)   Current Outpatient Medications (Other)  Medication Sig  . ergocalciferol (VITAMIN D2) 50000 units capsule Take 1 capsule  (50,000 Units total) by mouth once a week. One capsule once weekly  . metroNIDAZOLE (METROGEL) 0.75 % vaginal gel Place 1 Applicatorful vaginally 2 (two) times daily.  . norethindrone-ethinyl estradiol 1/35 (ORTHO-NOVUM, NORTREL,CYCLAFEM) tablet Take 1 tablet by mouth daily.  . phentermine (ADIPEX-P) 37.5 MG tablet Take 1 tablet (37.5 mg total) by mouth daily before breakfast.  . triamterene-hydrochlorothiazide (MAXZIDE) 75-50 MG tablet Take 1 tablet by mouth daily.   No current facility-administered medications for this visit.  (Other)      REVIEW OF SYSTEMS: ROS    Positive for: Eyes   Negative for: Constitutional, Gastrointestinal, Neurological, Skin, Genitourinary, Musculoskeletal, HENT, Endocrine, Cardiovascular, Respiratory, Psychiatric, Allergic/Imm, Heme/Lymph   Last edited by Matthew Folks, COA on 11/16/2018  8:42 AM. (History)       ALLERGIES No Known Allergies  PAST MEDICAL HISTORY Past Medical History:  Diagnosis Date  . Allergy   . Hypertension   . Obesity    History reviewed. No pertinent surgical history.  FAMILY HISTORY Family History  Problem Relation Age of Onset  . Diabetes Father   . Hypertension Father   . Hypertension Mother     SOCIAL HISTORY Social History   Tobacco Use  . Smoking status: Never Smoker  . Smokeless tobacco: Never Used  Substance Use Topics  . Alcohol use: No  . Drug use: No  OPHTHALMIC EXAM:  Base Eye Exam    Visual Acuity (Snellen - Linear)      Right Left   Dist cc 20/20 + 20/20 +   Correction: Glasses       Tonometry (Tonopen, 8:43 AM)      Right Left   Pressure 21 20       Pupils      Dark Light Shape React APD   Right 4 3 Round Brisk None   Left 4 3 Round Brisk None       Visual Fields (Counting fingers)      Left Right    Full Full       Extraocular Movement      Right Left    Full, Ortho Full, Ortho       Neuro/Psych    Oriented x3: Yes   Mood/Affect: Normal       Dilation     Both eyes: 1.0% Mydriacyl, 2.5% Phenylephrine @ 8:43 AM        Slit Lamp and Fundus Exam    Slit Lamp Exam      Right Left   Lids/Lashes Normal Normal   Conjunctiva/Sclera mild Melanosis mild Melanosis   Cornea trace Punctate epithelial erosions, trace Arcus trace Punctate epithelial erosions, trace Arcus   Anterior Chamber Deep and quiet Deep and quiet   Iris Round and dilated Round and dilated   Lens 1+ Cortical cataract 1+ Cortical cataract   Vitreous Vitreous syneresis Vitreous syneresis       Fundus Exam      Right Left   Disc Pink and Sharp Pink and Sharp, mild tilt   C/D Ratio 0.2 0.2   Macula Flat, Good foveal reflex, No heme or edema Flat, Good foveal reflex, No heme or edema   Vessels mild Tortuousity mild Tortuousity   Periphery Attached, mild lattice at 1200, pigmented lattice with atrophic holes at 0630-0700, atrophic hole at 1045 Attached, pigmented lattice at 1230, round hole at 0300 with trace cuff of SRF, small hole at 0430, pigmented lattice at 0600         Refraction    Wearing Rx      Sphere Cylinder Axis   Right -3.00 +0.75 177   Left -2.75 +0.75 177   Age: Many yrs   Type: SVL       Manifest Refraction      Sphere Cylinder Axis Dist VA   Right -3.00 +0.75 177 20/20+   Left -2.75 +0.75 177 20/20+          IMAGING AND PROCEDURES  Imaging and Procedures for @TODAY @  OCT, Retina - OU - Both Eyes       Right Eye Quality was good. Central Foveal Thickness: 262. Progression has no prior data. Findings include normal foveal contour, no IRF, no SRF.   Left Eye Quality was good. Central Foveal Thickness: 264. Progression has no prior data. Findings include normal foveal contour, no SRF, no IRF.   Notes *Images captured and stored on drive  Diagnosis / Impression:  NFP; no IRF/SRF OU   Clinical management:  See below  Abbreviations: NFP - Normal foveal profile. CME - cystoid macular edema. PED - pigment epithelial detachment. IRF -  intraretinal fluid. SRF - subretinal fluid. EZ - ellipsoid zone. ERM - epiretinal membrane. ORA - outer retinal atrophy. ORT - outer retinal tubulation. SRHM - subretinal hyper-reflective material        Repair Retinal Breaks, Laser - OD - Right Eye  LASER PROCEDURE NOTE  Procedure:  Barrier laser retinopexy using slit lamp laser, RIGHT eye   Diagnosis:   Lattice degeneration w/ atrophic holes, RIGHT eye                     Patches of lattice: 03-99 superiorly, 0630-0700 inferiorly; hole at 1045  Surgeon: Bernarda Caffey, MD, PhD  Anesthesia: Topical  Informed consent obtained, operative eye marked, and time out performed prior to initiation of laser.   Laser settings:  Lumenis [id number] laser, slit lamp Lens: [place] PRP 165 Power: 230 mW Spot size: 200 microns Duration: 30 msec  # spots: 530  Placement of laser: Using a [place] PRP 165 contact lens at the slit lamp, laser was placed in three confluent rows around patches of lattice w/ atrophic holes with additional rows anteriorly.  Complications: None.  Patient tolerated the procedure well and received written and verbal post-procedure care information/education.                 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 Retinal holes, bilateral  H33.323 Repair Retinal Breaks, Laser - OD - Right Eye  3. Retinal edema  H35.81 OCT, Retina - OU - Both Eyes  4. Essential hypertension  I10   5. Hypertensive retinopathy of both eyes  H35.033   6. Myopia of both eyes with astigmatism  H52.13    H52.203     1-3. Lattice degeneration w/ atrophic holes, both eyes - scattered patches of lattice w/ atrophic holes OU  OD: hole at 1045, lattice from 11-1, and 630-7  OS: 1230, 0300, 0430, 0600 - discussed findings, prognosis, and treatment options including observation - recommend laser retinopexy OU, OD first - pt wishes to  proceed with laser - RBA of procedure discussed, questions answered - informed consent obtained and signed - see procedure note - start PF QID OD x7 days - f/u in 1 wks, POV for OD laser retinopexy; laser retinopexy OS  4,5. Hypertensive retinopathy OU  - discussed importance of tight BP control  - monitor  6. Myopia w/ astigmatism OU - discussed association of myopia with lattice degeneration and increased risk of RT/RD   Ophthalmic Meds Ordered this visit:  Meds ordered this encounter  Medications  . prednisoLONE acetate (PRED FORTE) 1 % ophthalmic suspension    Sig: Place 1 drop into the right eye 4 (four) times daily for 7 days.    Dispense:  10 mL    Refill:  0       Return in about 1 week (around 11/23/2018) for POV OD, Laser OS.  There are no Patient Instructions on file for this visit.   Explained the diagnoses, plan, and follow up with the patient and they expressed understanding.  Patient expressed understanding of the importance of proper follow up care.   This document serves as a record of services personally performed by Gardiner Sleeper, MD, PhD. It was created on their behalf by Ernest Mallick, OA, an ophthalmic assistant. The creation of this record is the provider's dictation and/or activities during the visit.    Electronically signed by: Ernest Mallick, OA  07.14.2020 12:36 PM    Gardiner Sleeper, M.D., Ph.D. Diseases & Surgery of the Retina and Vitreous [facility]   I have reviewed the above documentation for accuracy and completeness, and I agree  with the above. Gardiner Sleeper, M.D., Ph.D. 11/16/18 12:36 PM     Abbreviations: M myopia (nearsighted); A astigmatism; H hyperopia (farsighted); P presbyopia; Mrx spectacle prescription;  CTL contact lenses; OD right eye; OS left eye; OU both eyes  XT exotropia; ET esotropia; PEK punctate epithelial keratitis; PEE punctate epithelial erosions; DES dry eye syndrome; MGD meibomian gland  dysfunction; ATs artificial tears; PFAT's preservative free artificial tears; [place] nuclear sclerotic cataract; PSC posterior subcapsular cataract; ERM epi-retinal membrane; PVD posterior vitreous detachment; RD retinal detachment; DM diabetes mellitus; DR diabetic retinopathy; NPDR non-proliferative diabetic retinopathy; PDR proliferative diabetic retinopathy; CSME clinically significant macular edema; DME diabetic macular edema; dbh dot blot hemorrhages; CWS cotton wool spot; POAG primary open angle glaucoma; C/D cup-to-disc ratio; HVF humphrey visual field; GVF goldmann visual field; OCT optical coherence tomography; IOP intraocular pressure; BRVO Branch retinal vein occlusion; CRVO central retinal vein occlusion; CRAO central retinal artery occlusion; BRAO branch retinal artery occlusion; RT retinal tear; SB scleral buckle; PPV pars plana vitrectomy; VH Vitreous hemorrhage; PRP panretinal laser photocoagulation; IVK intravitreal kenalog; VMT vitreomacular traction; MH Macular hole;  NVD neovascularization of the disc; NVE neovascularization elsewhere; AREDS age related eye disease study; ARMD age related macular degeneration; POAG primary open angle glaucoma; EBMD epithelial/anterior basement membrane dystrophy; ACIOL anterior chamber intraocular lens; IOL intraocular lens; PCIOL posterior chamber intraocular lens; Phaco/IOL phacoemulsification with intraocular lens placement; [place] photorefractive keratectomy; LASIK laser assisted in situ keratomileusis; HTN hypertension; DM diabetes mellitus; COPD chronic obstructive pulmonary disease

## 2018-11-16 ENCOUNTER — Other Ambulatory Visit: Payer: Self-pay

## 2018-11-16 ENCOUNTER — Encounter (INDEPENDENT_AMBULATORY_CARE_PROVIDER_SITE_OTHER): Payer: Self-pay | Admitting: Ophthalmology

## 2018-11-16 ENCOUNTER — Ambulatory Visit (INDEPENDENT_AMBULATORY_CARE_PROVIDER_SITE_OTHER): Payer: BC Managed Care – PPO | Admitting: Ophthalmology

## 2018-11-16 DIAGNOSIS — H3581 Retinal edema: Secondary | ICD-10-CM | POA: Diagnosis not present

## 2018-11-16 DIAGNOSIS — I10 Essential (primary) hypertension: Secondary | ICD-10-CM

## 2018-11-16 DIAGNOSIS — H33323 Round hole, bilateral: Secondary | ICD-10-CM

## 2018-11-16 DIAGNOSIS — H35413 Lattice degeneration of retina, bilateral: Secondary | ICD-10-CM

## 2018-11-16 DIAGNOSIS — H52203 Unspecified astigmatism, bilateral: Secondary | ICD-10-CM

## 2018-11-16 DIAGNOSIS — H5213 Myopia, bilateral: Secondary | ICD-10-CM

## 2018-11-16 DIAGNOSIS — H35033 Hypertensive retinopathy, bilateral: Secondary | ICD-10-CM

## 2018-11-16 MED ORDER — PREDNISOLONE ACETATE 1 % OP SUSP: 1.0000 [drp] | Freq: Four times a day (QID) | OPHTHALMIC | 0 refills | Status: AC

## 2018-11-20 NOTE — Progress Notes (Signed)
[facility] Note  11/21/2018     CHIEF COMPLAINT Patient presents for Post-op Follow-up   HISTORY OF PRESENT ILLNESS: Rose Mcguire is a 43 y.o. female who presents to the clinic today for:   HPI    Post-op Follow-up    In left eye.  Discomfort includes none.  Vision is stable.  I, the attending physician,  performed the HPI with the patient and updated documentation appropriately.          Comments    Patient states her vision is stable in both eyes.  She denies eye pain or discomfort and denies any new or worsening floaters or fol OU.       Last edited by Bernarda Caffey, MD on 11/21/2018  3:48 PM. (History)      Referring physician: Fayrene Helper, MD [address] [place],  [place] [address]  HISTORICAL INFORMATION:   Selected notes from the MEDICAL RECORD NUMBER Referred by Shirleen Schirmer, PA-C for concern of retinal holes LEE: 7.13.20 (A. 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 prednisoLONE acetate (PRED FORTE) 1 % ophthalmic suspension Place 1 drop into the right eye 4 (four) times daily for 7 days.   No current facility-administered medications for this visit.  (Ophthalmic Drugs)   Current Outpatient Medications (Other)  Medication Sig  . ergocalciferol (VITAMIN D2) 50000 units capsule Take 1 capsule (50,000 Units total) by mouth once a week. One capsule once weekly  . metroNIDAZOLE (METROGEL) 0.75 % vaginal gel Place 1 Applicatorful vaginally 2 (two) times daily.  . norethindrone-ethinyl estradiol 1/35 (ORTHO-NOVUM, NORTREL,CYCLAFEM) tablet Take 1 tablet by mouth daily.  . phentermine (ADIPEX-P) 37.5 MG tablet Take 1 tablet (37.5 mg total) by mouth daily before breakfast.  . triamterene-hydrochlorothiazide (MAXZIDE) 75-50 MG tablet Take 1 tablet by mouth daily.   No current  facility-administered medications for this visit.  (Other)      REVIEW OF SYSTEMS: ROS    Positive for: Eyes   Negative for: Constitutional, Gastrointestinal, Neurological, Skin, Genitourinary, Musculoskeletal, HENT, Endocrine, Cardiovascular, Respiratory, Psychiatric, Allergic/Imm, Heme/Lymph   Last edited by Doneen Poisson on 11/21/2018  9:00 AM. (History)       ALLERGIES No Known Allergies  PAST MEDICAL HISTORY Past Medical History:  Diagnosis Date  . Allergy   . Hypertension   . Obesity    History reviewed. No pertinent surgical history.  FAMILY HISTORY Family History  Problem Relation Age of Onset  . Diabetes Father   . Hypertension Father   . Hypertension Mother     SOCIAL HISTORY Social History   Tobacco Use  . Smoking status: Never Smoker  . Smokeless tobacco: Never Used  Substance Use Topics  . Alcohol use: No  . Drug use: No         OPHTHALMIC EXAM:  Base Eye Exam    Visual Acuity (Snellen - Linear)      Right Left   Dist cc 20/20 -1 20/20 -1   Correction: Glasses       Tonometry (Tonopen, 9:00 AM)      Right Left   Pressure 18 22       Pupils      Dark Light Shape React APD   Right 3 2 Round Brisk 0   Left 3 2 Round Brisk 0  Visual Fields      Left Right    Full Full       Extraocular Movement      Right Left    Full Full       Neuro/Psych    Oriented x3: Yes   Mood/Affect: Normal       Dilation    Both eyes: 1.0% Mydriacyl, 2.5% Phenylephrine @ 9:00 AM        Slit Lamp and Fundus Exam    Slit Lamp Exam      Right Left   Lids/Lashes Normal Normal   Conjunctiva/Sclera mild Melanosis mild Melanosis   Cornea trace Punctate epithelial erosions, trace Arcus trace Punctate epithelial erosions, trace Arcus   Anterior Chamber Deep and quiet Deep and quiet   Iris Round and dilated Round and dilated   Lens 1+ Cortical cataract 1+ Cortical cataract   Vitreous Vitreous syneresis Vitreous syneresis       Fundus  Exam      Right Left   Disc Pink and Sharp Pink and Sharp, mild tilt   C/D Ratio 0.2 0.2   Macula Flat, Good foveal reflex, No heme or edema Flat, Good foveal reflex, No heme or edema   Vessels mild Tortuousity mild Tortuousity   Periphery Attached, mild lattice at 1200, pigmented lattice with atrophic holes at 0630-0700, atrophic hole at 1045; early laser changes from 10:30-01:00; also good early laser changes surrounding lattice with holes 06:30-07:00: no new RT/RD Attached, pigmented lattice at 1230, round hole at 0300 with trace cuff of SRF, small hole at 0430, pigmented lattice at 0600         Refraction    Wearing Rx      Sphere Cylinder Axis   Right -3.00 +0.75 177   Left -2.75 +0.75 177   Type: SVL          IMAGING AND PROCEDURES  Imaging and Procedures for @TODAY @  OCT, Retina - OU - Both Eyes       Right Eye Quality was good. Central Foveal Thickness: 267. Progression has been stable. Findings include normal foveal contour, no IRF, no SRF.   Left Eye Quality was good. Central Foveal Thickness: 264. Progression has been stable. Findings include normal foveal contour, no SRF, no IRF.   Notes *Images captured and stored on drive  Diagnosis / Impression:  NFP; no IRF/SRF OU--stable from prior   Clinical management:  See below  Abbreviations: NFP - Normal foveal profile. CME - cystoid macular edema. PED - pigment epithelial detachment. IRF - intraretinal fluid. SRF - subretinal fluid. EZ - ellipsoid zone. ERM - epiretinal membrane. ORA - outer retinal atrophy. ORT - outer retinal tubulation. 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 Retinal holes, bilateral  H33.323   3. Retinal edema  H35.81 OCT, Retina - OU - Both Eyes  4. Essential hypertension  I10   5. Hypertensive retinopathy of both eyes  H35.033   6. Myopia of both eyes with astigmatism   H52.13    H52.203     1-3. Lattice degeneration w/ atrophic holes, both eyes - scattered patches of lattice w/ atrophic holes OU  OD: hole at 1045, lattice from 11-1, and 630-7  OS: 1230, 0300, 0430, 0600 - discussed findings, prognosis, and treatment options including observation - s/p laser  retinopexy OD (07.15.20)-good early laser changes surrounding patches of lattice degeneration and atrophic holes -- not yet mature - recommend laser retinopexy OS - pt wishes to defer laser until another day due to work schedule0 - patient to return on 07.27.20 for laser retinopexy OS  4,5. Hypertensive retinopathy OU  - discussed importance of tight BP control  - monitor  6. Myopia w/ astigmatism OU - discussed association of myopia with lattice degeneration and increased risk of RT/RD   Ophthalmic Meds Ordered this visit:  No orders of the defined types were placed in this encounter.      Return in about 1 week (around 11/28/2018) for laser retinopexy OS.  There are no Patient Instructions on file for this visit.   Explained the diagnoses, plan, and follow up with the patient and they expressed understanding.  Patient expressed understanding of the importance of proper follow up care.   This document serves as a record of services personally performed by Gardiner Sleeper, MD, PhD. It was created on their behalf by Ernest Mallick, OA, an ophthalmic assistant. The creation of this record is the provider's dictation and/or activities during the visit.    Electronically signed by: Ernest Mallick, OA  07.19.2020 3:58 PM    Gardiner Sleeper, M.D., Ph.D. Diseases & Surgery of the Retina and Vitreous [facility]  I have reviewed the above documentation for accuracy and completeness, and I agree with the above. Gardiner Sleeper, M.D., Ph.D. 11/21/18 4:01 PM    Abbreviations: M myopia (nearsighted); A astigmatism; H hyperopia (farsighted); P presbyopia; Mrx spectacle  prescription;  CTL contact lenses; OD right eye; OS left eye; OU both eyes  XT exotropia; ET esotropia; PEK punctate epithelial keratitis; PEE punctate epithelial erosions; DES dry eye syndrome; MGD meibomian gland dysfunction; ATs artificial tears; PFAT's preservative free artificial tears; [place] nuclear sclerotic cataract; PSC posterior subcapsular cataract; ERM epi-retinal membrane; PVD posterior vitreous detachment; RD retinal detachment; DM diabetes mellitus; DR diabetic retinopathy; NPDR non-proliferative diabetic retinopathy; PDR proliferative diabetic retinopathy; CSME clinically significant macular edema; DME diabetic macular edema; dbh dot blot hemorrhages; CWS cotton wool spot; POAG primary open angle glaucoma; C/D cup-to-disc ratio; HVF humphrey visual field; GVF goldmann visual field; OCT optical coherence tomography; IOP intraocular pressure; BRVO Branch retinal vein occlusion; CRVO central retinal vein occlusion; CRAO central retinal artery occlusion; BRAO branch retinal artery occlusion; RT retinal tear; SB scleral buckle; PPV pars plana vitrectomy; VH Vitreous hemorrhage; PRP panretinal laser photocoagulation; IVK intravitreal kenalog; VMT vitreomacular traction; MH Macular hole;  NVD neovascularization of the disc; NVE neovascularization elsewhere; AREDS age related eye disease study; ARMD age related macular degeneration; POAG primary open angle glaucoma; EBMD epithelial/anterior basement membrane dystrophy; ACIOL anterior chamber intraocular lens; IOL intraocular lens; PCIOL posterior chamber intraocular lens; Phaco/IOL phacoemulsification with intraocular lens placement; [place] photorefractive keratectomy; LASIK laser assisted in situ keratomileusis; HTN hypertension; DM diabetes mellitus; COPD chronic obstructive pulmonary disease

## 2018-11-21 ENCOUNTER — Encounter (INDEPENDENT_AMBULATORY_CARE_PROVIDER_SITE_OTHER): Payer: Self-pay | Admitting: Ophthalmology

## 2018-11-21 ENCOUNTER — Ambulatory Visit (INDEPENDENT_AMBULATORY_CARE_PROVIDER_SITE_OTHER): Payer: BC Managed Care – PPO | Admitting: Ophthalmology

## 2018-11-21 ENCOUNTER — Other Ambulatory Visit: Payer: Self-pay

## 2018-11-21 DIAGNOSIS — H52203 Unspecified astigmatism, bilateral: Secondary | ICD-10-CM

## 2018-11-21 DIAGNOSIS — H5213 Myopia, bilateral: Secondary | ICD-10-CM

## 2018-11-21 DIAGNOSIS — H33323 Round hole, bilateral: Secondary | ICD-10-CM

## 2018-11-21 DIAGNOSIS — I10 Essential (primary) hypertension: Secondary | ICD-10-CM

## 2018-11-21 DIAGNOSIS — H35033 Hypertensive retinopathy, bilateral: Secondary | ICD-10-CM

## 2018-11-21 DIAGNOSIS — H35413 Lattice degeneration of retina, bilateral: Secondary | ICD-10-CM

## 2018-11-21 DIAGNOSIS — H3581 Retinal edema: Secondary | ICD-10-CM | POA: Diagnosis not present

## 2018-11-24 NOTE — Progress Notes (Signed)
[facility] Note  11/28/2018     CHIEF COMPLAINT Patient presents for Post-op Follow-up   HISTORY OF PRESENT ILLNESS: Rose Mcguire is a 43 y.o. female who presents to the clinic today for:   HPI    Post-op Follow-up    In right eye.  Discomfort includes none.  Vision is stable.  I, the attending physician,  performed the HPI with the patient and updated documentation appropriately.          Comments    43 y/o female pt here for 2 wk f/u s/p laser retinopexy OD (11/16/18), and here for laser retinopexy OS today.  No change in [place] OU.  Denies pain, flashes, floaters.  No gtts.       Last edited by Bernarda Caffey, MD on 11/28/2018 11:41 PM. (History)    s/p laser retinopexy OD, possible laser retinopexy OS  Referring physician: Fayrene Helper, MD [address], [place],  [place] [address]  HISTORICAL INFORMATION:   Selected notes from the MEDICAL RECORD NUMBER Referred by Shirleen Schirmer, PA-C for concern of retinal holes LEE: 7.13.20 (A. 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 (Ophthalmic Drugs)   No current facility-administered medications for this visit.  (Ophthalmic Drugs)   Current Outpatient Medications (Other)  Medication Sig  . ergocalciferol (VITAMIN D2) 50000 units capsule Take 1 capsule (50,000 Units total) by mouth once a week. One capsule once weekly  . metroNIDAZOLE (METROGEL) 0.75 % vaginal gel Place 1 Applicatorful vaginally 2 (two) times daily.  . norethindrone-ethinyl estradiol 1/35 (ORTHO-NOVUM, NORTREL,CYCLAFEM) tablet Take 1 tablet by mouth daily.  . phentermine (ADIPEX-P) 37.5 MG tablet Take 1 tablet (37.5 mg total) by mouth daily before breakfast.  . triamterene-hydrochlorothiazide (MAXZIDE) 75-50 MG tablet Take 1 tablet by mouth daily.   No current facility-administered medications for this  visit.  (Other)      REVIEW OF SYSTEMS: ROS    Positive for: Eyes   Negative for: Constitutional, Gastrointestinal, Neurological, Skin, Genitourinary, Musculoskeletal, HENT, Endocrine, Cardiovascular, Respiratory, Psychiatric, Allergic/Imm, Heme/Lymph   Last edited by Matthew Folks, COA on 11/28/2018 10:15 AM. (History)       ALLERGIES No Known Allergies  PAST MEDICAL HISTORY Past Medical History:  Diagnosis Date  . Allergy   . Hypertension   . Hypertensive retinopathy    OU  . Obesity    History reviewed. No pertinent surgical history.  FAMILY HISTORY Family History  Problem Relation Age of Onset  . Hypertension Mother   . Diabetes Father   . Hypertension Father     SOCIAL HISTORY Social History   Tobacco Use  . Smoking status: Never Smoker  . Smokeless tobacco: Never Used  Substance Use Topics  . Alcohol use: No  . Drug use: No         OPHTHALMIC EXAM:  Base Eye Exam    Visual Acuity (Snellen - Linear)      Right Left   Dist cc 20/20 +2 20/20 -2   Correction: Glasses       Tonometry (Tonopen, 10:25 AM)      Right Left   Pressure 16 17       Pupils      Dark Light Shape React APD   Right 3 2 Round Brisk None   Left 3 2 Round Brisk None  Visual Fields (Counting fingers)      Left Right    Full Full       Extraocular Movement      Right Left    Full, Ortho Full, Ortho       Neuro/Psych    Oriented x3: Yes   Mood/Affect: Normal       Dilation    Both eyes: 1.0% Mydriacyl, 2.5% Phenylephrine @ 10:25 AM        Slit Lamp and Fundus Exam    Slit Lamp Exam      Right Left   Lids/Lashes Normal Normal   Conjunctiva/Sclera mild Melanosis mild Melanosis   Cornea trace Punctate epithelial erosions, trace Arcus trace Punctate epithelial erosions, trace Arcus   Anterior Chamber Deep and quiet Deep and quiet   Iris Round and dilated Round and dilated   Lens 1+ Cortical cataract 1+ Cortical cataract   Vitreous Vitreous  syneresis Vitreous syneresis       Fundus Exam      Right Left   Disc Pink and Sharp Pink and Sharp, mild tilt   C/D Ratio 0.2 0.2   Macula Flat, Good foveal reflex, No heme or edema Flat, Good foveal reflex, No heme or edema   Vessels mild Tortuousity mild Tortuousity   Periphery Attached, mild lattice at 1200, pigmented lattice with atrophic holes at 0630-0700, atrophic hole at 1045; good laser changes from 10:30-01:00; also good laser changes surrounding lattice with holes 06:30-07:00: good laser surrounding all lesions, no new RT/RD Attached, pigmented lattice at 1230, round hole at 0330 with trace cuff of SRF, small hole at 0430, pigmented lattice at 0600           IMAGING AND PROCEDURES  Imaging and Procedures for @TODAY @  OCT, Retina - OU - Both Eyes       Right Eye Quality was good. Central Foveal Thickness: 268. Progression has been stable. Findings include normal foveal contour, no IRF, no SRF.   Left Eye Quality was good. Central Foveal Thickness: 265. Progression has been stable. Findings include normal foveal contour, no SRF, no IRF.   Notes *Images captured and stored on drive  Diagnosis / Impression:  NFP; no IRF/SRF OU--stable from prior   Clinical management:  See below  Abbreviations: NFP - Normal foveal profile. CME - cystoid macular edema. PED - pigment epithelial detachment. IRF - intraretinal fluid. SRF - subretinal fluid. EZ - ellipsoid zone. ERM - epiretinal membrane. ORA - outer retinal atrophy. ORT - outer retinal tubulation. SRHM - subretinal hyper-reflective material        Repair Retinal Breaks, Laser - OS - Left Eye       LASER PROCEDURE NOTE  Procedure:  Barrier laser retinopexy using slit lamp laser, LEFT eye   Diagnosis:   Lattice degeneration w/ atrophic holes, LEFT eye                     Patches of lattice: 1230 superiorly; 0330, 0430-0600 inferiorly  Surgeon: Bernarda Caffey, MD, PhD  Anesthesia: Topical  Informed consent  obtained, operative eye marked, and time out performed prior to initiation of laser.   Laser settings:  Lumenis [id number] laser, slit lamp Lens: [place] PRP 165 Power: 240 mW Spot size: 200 microns Duration: 30 msec  # spots: 556  Placement of laser: Using a [place] PRP 165 contact lens at the slit lamp, laser was placed in three confluent rows around patches of lattice w/ atrophic holes at 1230, 0330,  and 0430-0600 oclock, anterior to equator, with additional rows anteriorly.  Complications: None.  Patient tolerated the procedure well and received written and verbal post-procedure care information/education.                ASSESSMENT/PLAN:    ICD-10-CM   1. Lattice degeneration of both retinas  H35.413 Repair Retinal Breaks, Laser - OS - Left Eye  2. Retinal holes, bilateral  H33.323 Repair Retinal Breaks, Laser - OS - Left Eye  3. Retinal edema  H35.81 OCT, Retina - OU - Both Eyes  4. Essential hypertension  I10   5. Hypertensive retinopathy of both eyes  H35.033   6. Myopia of both eyes with astigmatism  H52.13    H52.203     1-3. Lattice degeneration w/ atrophic holes, both eyes - scattered patches of lattice w/ atrophic holes OU  OD: hole at 1045, lattice from 11-1, and 630-7  OS: 1230, 0330, 0430-0600 - s/p laser retinopexy OD (07.15.20) - good laser changes surrounding patches of lattice degeneration and atrophic holes - discussed findings, prognosis, and treatment options including observation - recommend laser retinopexy OS today, 07.27.20 - pt wishes to proceed - RBA of procedure discussed, questions answered - informed consent obtained and signed - see procedure note - start PF qid OS x7 days - f/u 2-3 weeks  4,5. Hypertensive retinopathy OU  - discussed importance of tight BP control  - monitor  6. Myopia w/ astigmatism OU - discussed association of myopia with lattice degeneration and increased risk of RT/RD   Ophthalmic Meds Ordered this visit:   No orders of the defined types were placed in this encounter.      Return 2-3 weeks, for DFE, OCT.  There are no Patient Instructions on file for this visit.   Explained the diagnoses, plan, and follow up with the patient and they expressed understanding.  Patient expressed understanding of the importance of proper follow up care.   This document serves as a record of services personally performed by Gardiner Sleeper, MD, PhD. It was created on their behalf by Ernest Mallick, OA, an ophthalmic assistant. The creation of this record is the provider's dictation and/or activities during the visit.    Electronically signed by: Ernest Mallick, OA  07.23.2020 11:46 PM    Gardiner Sleeper, M.D., Ph.D. Diseases & Surgery of the Retina and Vitreous [facility]  I have reviewed the above documentation for accuracy and completeness, and I agree with the above. Gardiner Sleeper, M.D., Ph.D. 11/28/18 11:46 PM     Abbreviations: M myopia (nearsighted); A astigmatism; H hyperopia (farsighted); P presbyopia; Mrx spectacle prescription;  CTL contact lenses; OD right eye; OS left eye; OU both eyes  XT exotropia; ET esotropia; PEK punctate epithelial keratitis; PEE punctate epithelial erosions; DES dry eye syndrome; MGD meibomian gland dysfunction; ATs artificial tears; PFAT's preservative free artificial tears; [place] nuclear sclerotic cataract; PSC posterior subcapsular cataract; ERM epi-retinal membrane; PVD posterior vitreous detachment; RD retinal detachment; DM diabetes mellitus; DR diabetic retinopathy; NPDR non-proliferative diabetic retinopathy; PDR proliferative diabetic retinopathy; CSME clinically significant macular edema; DME diabetic macular edema; dbh dot blot hemorrhages; CWS cotton wool spot; POAG primary open angle glaucoma; C/D cup-to-disc ratio; HVF humphrey visual field; GVF goldmann visual field; OCT optical coherence tomography; IOP intraocular pressure; BRVO Branch  retinal vein occlusion; CRVO central retinal vein occlusion; CRAO central retinal artery occlusion; BRAO branch retinal artery occlusion; RT retinal tear; SB scleral buckle; PPV pars  plana vitrectomy; VH Vitreous hemorrhage; PRP panretinal laser photocoagulation; IVK intravitreal kenalog; VMT vitreomacular traction; MH Macular hole;  NVD neovascularization of the disc; NVE neovascularization elsewhere; AREDS age related eye disease study; ARMD age related macular degeneration; POAG primary open angle glaucoma; EBMD epithelial/anterior basement membrane dystrophy; ACIOL anterior chamber intraocular lens; IOL intraocular lens; PCIOL posterior chamber intraocular lens; Phaco/IOL phacoemulsification with intraocular lens placement; [place] photorefractive keratectomy; LASIK laser assisted in situ keratomileusis; HTN hypertension; DM diabetes mellitus; COPD chronic obstructive pulmonary disease

## 2018-11-28 ENCOUNTER — Other Ambulatory Visit: Payer: Self-pay

## 2018-11-28 ENCOUNTER — Encounter (INDEPENDENT_AMBULATORY_CARE_PROVIDER_SITE_OTHER): Payer: Self-pay | Admitting: Ophthalmology

## 2018-11-28 ENCOUNTER — Ambulatory Visit (INDEPENDENT_AMBULATORY_CARE_PROVIDER_SITE_OTHER): Payer: BC Managed Care – PPO | Admitting: Ophthalmology

## 2018-11-28 ENCOUNTER — Ambulatory Visit: Payer: BLUE CROSS/BLUE SHIELD | Admitting: Family Medicine

## 2018-11-28 DIAGNOSIS — H52203 Unspecified astigmatism, bilateral: Secondary | ICD-10-CM

## 2018-11-28 DIAGNOSIS — H5213 Myopia, bilateral: Secondary | ICD-10-CM

## 2018-11-28 DIAGNOSIS — H3581 Retinal edema: Secondary | ICD-10-CM | POA: Diagnosis not present

## 2018-11-28 DIAGNOSIS — I10 Essential (primary) hypertension: Secondary | ICD-10-CM

## 2018-11-28 DIAGNOSIS — H33323 Round hole, bilateral: Secondary | ICD-10-CM

## 2018-11-28 DIAGNOSIS — H35413 Lattice degeneration of retina, bilateral: Secondary | ICD-10-CM

## 2018-11-28 DIAGNOSIS — H35033 Hypertensive retinopathy, bilateral: Secondary | ICD-10-CM

## 2018-12-05 ENCOUNTER — Ambulatory Visit (INDEPENDENT_AMBULATORY_CARE_PROVIDER_SITE_OTHER): Payer: Self-pay | Admitting: Family Medicine

## 2018-12-05 ENCOUNTER — Other Ambulatory Visit: Payer: Self-pay

## 2018-12-05 ENCOUNTER — Encounter: Payer: Self-pay | Admitting: Family Medicine

## 2018-12-05 VITALS — BP 134/84 | Ht 64.0 in | Wt 274.0 lb

## 2018-12-05 DIAGNOSIS — Z1231 Encounter for screening mammogram for malignant neoplasm of breast: Secondary | ICD-10-CM

## 2018-12-05 DIAGNOSIS — E559 Vitamin D deficiency, unspecified: Secondary | ICD-10-CM

## 2018-12-05 DIAGNOSIS — I10 Essential (primary) hypertension: Secondary | ICD-10-CM

## 2018-12-05 DIAGNOSIS — R7303 Prediabetes: Secondary | ICD-10-CM

## 2018-12-05 MED ORDER — PHENTERMINE HCL 37.5 MG PO TABS: ORAL_TABLET | ORAL | 3 refills | Status: DC

## 2018-12-05 NOTE — Progress Notes (Signed)
Virtual Visit via Telephone Note  I connected with Rose Mcguire on 12/05/18 at  3:40 PM EDT by telephone and verified that I am speaking with the correct person using two identifiers.  Location: Patient: home Provider: office   I discussed the limitations, risks, security and privacy concerns of performing an evaluation and management service by telephone and the availability of in person appointments. I also discussed with the patient that there may be a patient responsible charge related to this service. The patient expressed understanding and agreed to proceed. This visit type is conducted due to national recommendations for restrictions regarding the COVID -19 Pandemic. Due to the patient's age and / or co morbidities, this format is felt to be most appropriate at this time without adequate follow up. The patient has no access to video technology/ had technical difficulties with video, requiring transitioning to audio format  only ( telephone ). All issues noted this document were discussed and addressed,no physical exam can be performed in this format.    History of Present Illness: F/u obesity an hypertension, states has not been exercising as much as in the past, but is going to start once more Denies recent fever or chills. Denies sinus pressure, nasal congestion, ear pain or sore throat. Denies chest congestion, productive cough or wheezing. Denies chest pains, palpitations and leg swelling Denies abdominal pain, nausea, vomiting,diarrhea or constipation.   Denies dysuria, frequency, hesitancy or incontinence. Denies joint pain, swelling and limitation in mobility. Denies headaches, seizures, numbness, or tingling. Denies depression, anxiety or insomnia. Denies skin break down or rash.       Observations/Objective: BP 134/84   Ht 5\' 4"  (1.626 m)   Wt 274 lb (124.3 kg)   BMI 47.03 kg/m   Good communication with no confusion and intact memory. Alert and oriented x  3 No signs of respiratory distress during speech     Assessment and Plan: Essential hypertension Controlled, no change in medication DASH diet and commitment to daily physical activity for a minimum of 30 minutes discussed and encouraged, as a part of hypertension management. The importance of attaining a healthy weight is also discussed.  BP/Weight 12/05/2018 03/24/2018 11/18/2017 06/28/2017 02/15/2017 10/29/2016 07/15/9700  Systolic BP 637 858 850 277 412 878 -  Diastolic BP 84 84 88 84 94 98 -  Wt. (Lbs) 274 282 272.08 286 291.5 294 285  BMI 47.03 48.41 46.7 49.09 50.04 50.46 48.9       Morbid obesity  Patient re-educated about  the importance of commitment to a  minimum of 150 minutes of exercise per week as able.  The importance of healthy food choices with portion control discussed, as well as eating regularly and within a 12 hour window most days. The need to choose "clean , green" food 50 to 75% of the time is discussed, as well as to make water the primary drink and set a goal of 64 ounces water daily.    Weight /BMI 12/05/2018 03/24/2018 11/18/2017  WEIGHT 274 lb 282 lb 272 lb 1.3 oz  HEIGHT 5\' 4"  5\' 4"  5\' 4"   BMI 47.03 kg/m2 48.41 kg/m2 46.7 kg/m2      Prediabetes Patient educated about the importance of limiting  Carbohydrate intake , the need to commit to daily physical activity for a minimum of 30 minutes , and to commit weight loss. The fact that changes in all these areas will reduce or eliminate all together the development of diabetes is stressed.  Updated lab  needed at/ before next visit.   Diabetic Labs Latest Ref Rng & Units 11/18/2017 06/04/2016 01/29/2015 09/07/2014 04/04/2013  HbA1c <5.7 % - 5.8(H) 6.0(H) 5.9(H) 6.0(H)  Chol <200 mg/dL - 157 - 155 149  HDL >50 mg/dL - 64 - 70 53  Calc LDL <100 mg/dL - 83 - 75 87  Triglycerides <150 mg/dL - 49 - 49 46  Creatinine 0.50 - 1.10 mg/dL 0.76 0.76 0.73 0.76 0.65   BP/Weight 12/05/2018 03/24/2018 11/18/2017 06/28/2017  02/15/2017 10/29/2016 9/77/4142  Systolic BP 395 320 233 435 686 168 -  Diastolic BP 84 84 88 84 94 98 -  Wt. (Lbs) 274 282 272.08 286 291.5 294 285  BMI 47.03 48.41 46.7 49.09 50.04 50.46 48.9   No flowsheet data found.    Vitamin D deficiency Updated lab needed at/ before next visit.     Follow Up Instructions:    I discussed the assessment and treatment plan with the patient. The patient was provided an opportunity to ask questions and all were answered. The patient agreed with the plan and demonstrated an understanding of the instructions.   The patient was advised to call back or seek an in-person evaluation if the symptoms worsen or if the condition fails to improve as anticipated.  I provided 22 minutes of non-face-to-face time during this encounter.   Tula Nakayama, MD

## 2018-12-05 NOTE — Patient Instructions (Addendum)
F/U with MD in 2 months, in office,  call if you need me before  Please sched mammogram for October for pt  Half phentermine tablet daily is at [facility] for you to fill today Please get fasting labsin 7 weeks, order already at lab  It is important that you exercise regularly at least 30 minutes 5 times a week. If you develop chest pain, have severe difficulty breathing, or feel very tired, stop exercising immediately and seek medical attention   Think about what you will eat, plan ahead. Choose " clean, green, fresh or frozen" over canned, processed or packaged foods which are more sugary, salty and fatty. 70 to 75% of food eaten should be vegetables and fruit. Three meals at set times with snacks allowed between meals, but they must be fruit or vegetables. Aim to eat over a 12 hour period , example 7 am to 7 pm, and STOP after  your last meal of the day. Drink water,generally about 64 ounces per day, no other drink is as healthy. Fruit juice is best enjoyed in a healthy way, by EATING the fruit.  Social distancing. Frequent hand washing with soap and water Keeping your hands off of your face. These 3 practices will help to keep both you and your community healthy during this time. Please practice them faithfully!

## 2018-12-11 ENCOUNTER — Encounter: Payer: Self-pay | Admitting: Family Medicine

## 2018-12-11 NOTE — Assessment & Plan Note (Signed)
Updated lab needed at/ before next visit.   

## 2018-12-11 NOTE — Assessment & Plan Note (Signed)
  Patient re-educated about  the importance of commitment to a  minimum of 150 minutes of exercise per week as able.  The importance of healthy food choices with portion control discussed, as well as eating regularly and within a 12 hour window most days. The need to choose "clean , green" food 50 to 75% of the time is discussed, as well as to make water the primary drink and set a goal of 64 ounces water daily.    Weight /BMI 12/05/2018 03/24/2018 11/18/2017  WEIGHT 274 lb 282 lb 272 lb 1.3 oz  HEIGHT 5\' 4"  5\' 4"  5\' 4"   BMI 47.03 kg/m2 48.41 kg/m2 46.7 kg/m2

## 2018-12-11 NOTE — Assessment & Plan Note (Signed)
Controlled, no change in medication DASH diet and commitment to daily physical activity for a minimum of 30 minutes discussed and encouraged, as a part of hypertension management. The importance of attaining a healthy weight is also discussed.  BP/Weight 12/05/2018 03/24/2018 11/18/2017 06/28/2017 02/15/2017 10/29/2016 7/61/6073  Systolic BP 710 626 948 546 270 350 -  Diastolic BP 84 84 88 84 94 98 -  Wt. (Lbs) 274 282 272.08 286 291.5 294 285  BMI 47.03 48.41 46.7 49.09 50.04 50.46 48.9

## 2018-12-11 NOTE — Assessment & Plan Note (Signed)
Patient educated about the importance of limiting  Carbohydrate intake , the need to commit to daily physical activity for a minimum of 30 minutes , and to commit weight loss. The fact that changes in all these areas will reduce or eliminate all together the development of diabetes is stressed.  Updated lab needed at/ before next visit.   Diabetic Labs Latest Ref Rng & Units 11/18/2017 06/04/2016 01/29/2015 09/07/2014 04/04/2013  HbA1c <5.7 % - 5.8(H) 6.0(H) 5.9(H) 6.0(H)  Chol <200 mg/dL - 157 - 155 149  HDL >50 mg/dL - 64 - 70 53  Calc LDL <100 mg/dL - 83 - 75 87  Triglycerides <150 mg/dL - 49 - 49 46  Creatinine 0.50 - 1.10 mg/dL 0.76 0.76 0.73 0.76 0.65   BP/Weight 12/05/2018 03/24/2018 11/18/2017 06/28/2017 02/15/2017 10/29/2016 0/97/3532  Systolic BP 992 426 834 196 222 979 -  Diastolic BP 84 84 88 84 94 98 -  Wt. (Lbs) 274 282 272.08 286 291.5 294 285  BMI 47.03 48.41 46.7 49.09 50.04 50.46 48.9   No flowsheet data found.

## 2018-12-12 ENCOUNTER — Other Ambulatory Visit: Payer: Self-pay

## 2018-12-12 ENCOUNTER — Encounter (INDEPENDENT_AMBULATORY_CARE_PROVIDER_SITE_OTHER): Payer: Self-pay | Admitting: Ophthalmology

## 2018-12-12 ENCOUNTER — Ambulatory Visit (INDEPENDENT_AMBULATORY_CARE_PROVIDER_SITE_OTHER): Payer: BC Managed Care – PPO | Admitting: Ophthalmology

## 2018-12-12 DIAGNOSIS — H35033 Hypertensive retinopathy, bilateral: Secondary | ICD-10-CM

## 2018-12-12 DIAGNOSIS — H3581 Retinal edema: Secondary | ICD-10-CM

## 2018-12-12 DIAGNOSIS — H52203 Unspecified astigmatism, bilateral: Secondary | ICD-10-CM

## 2018-12-12 DIAGNOSIS — H33323 Round hole, bilateral: Secondary | ICD-10-CM

## 2018-12-12 DIAGNOSIS — H5213 Myopia, bilateral: Secondary | ICD-10-CM

## 2018-12-12 DIAGNOSIS — I10 Essential (primary) hypertension: Secondary | ICD-10-CM

## 2018-12-12 DIAGNOSIS — H35413 Lattice degeneration of retina, bilateral: Secondary | ICD-10-CM

## 2018-12-12 NOTE — Progress Notes (Signed)
[facility] Note  12/12/2018     CHIEF COMPLAINT Patient presents for Retina Follow Up   HISTORY OF PRESENT ILLNESS: Rose Mcguire is a 43 y.o. female who presents to the clinic today for:   HPI    Retina Follow Up    Patient presents with  Other.  In both eyes.  This started 2 weeks ago.  Severity is moderate.  I, the attending physician,  performed the HPI with the patient and updated documentation appropriately.          Comments    Patient here for 2 weeks retina follow up for lattice degen OU. Patient states vision doing pretty good. No eye pain.       Last edited by Bernarda Caffey, MD on 12/12/2018 11:37 PM. (History)    pt states she has had no problems since receiving laser at last visit  Referring physician: Fayrene Helper, MD [address] [place],  [place] [address]  HISTORICAL INFORMATION:   Selected notes from the MEDICAL RECORD NUMBER Referred by Shirleen Schirmer, PA-C for concern of retinal holes LEE: 7.13.20 (A. 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 (Ophthalmic Drugs)   No current facility-administered medications for this visit.  (Ophthalmic Drugs)   Current Outpatient Medications (Other)  Medication Sig  . ergocalciferol (VITAMIN D2) 50000 units capsule Take 1 capsule (50,000 Units total) by mouth once a week. One capsule once weekly  . norethindrone-ethinyl estradiol 1/35 (ORTHO-NOVUM, NORTREL,CYCLAFEM) tablet Take 1 tablet by mouth daily.  . phentermine (ADIPEX-P) 37.5 MG tablet Take half tablet by mouth every morning before breakfast  . triamterene-hydrochlorothiazide (MAXZIDE) 75-50 MG tablet Take 1 tablet by mouth daily.   No current facility-administered medications for this visit.  (Other)      REVIEW OF SYSTEMS: ROS    Positive for: Eyes   Negative for: Constitutional,  Gastrointestinal, Neurological, Skin, Genitourinary, Musculoskeletal, HENT, Endocrine, Cardiovascular, Respiratory, Psychiatric, Allergic/Imm, Heme/Lymph   Last edited by Theodore Demark on 12/12/2018  2:49 PM. (History)       ALLERGIES No Known Allergies  PAST MEDICAL HISTORY Past Medical History:  Diagnosis Date  . Allergy   . Hypertension   . Hypertensive retinopathy    OU  . Obesity    History reviewed. No pertinent surgical history.  FAMILY HISTORY Family History  Problem Relation Age of Onset  . Hypertension Mother   . Diabetes Father   . Hypertension Father     SOCIAL HISTORY Social History   Tobacco Use  . Smoking status: Never Smoker  . Smokeless tobacco: Never Used  Substance Use Topics  . Alcohol use: No  . Drug use: No         OPHTHALMIC EXAM:  Base Eye Exam    Visual Acuity (Snellen - Linear)      Right Left   Dist cc 20/20 20/20 -1   Correction: Glasses       Tonometry (Tonopen, 2:47 PM)      Right Left   Pressure 22 18       Pupils      Dark Light Shape React APD   Right 3 2 Round Brisk None   Left 3 2 Round Brisk None       Visual Fields (Counting fingers)      Left Right    Full Full  Extraocular Movement      Right Left    Full, Ortho Full, Ortho       Neuro/Psych    Oriented x3: Yes   Mood/Affect: Normal       Dilation    Both eyes: 1.0% Mydriacyl, 2.5% Phenylephrine @ 2:46 PM        Slit Lamp and Fundus Exam    Slit Lamp Exam      Right Left   Lids/Lashes Normal Normal   Conjunctiva/Sclera mild Melanosis mild Melanosis   Cornea trace Punctate epithelial erosions, trace Arcus trace Punctate epithelial erosions, trace Arcus   Anterior Chamber Deep and quiet Deep and quiet   Iris Round and dilated Round and dilated   Lens 1+ Cortical cataract 1+ Cortical cataract   Vitreous Vitreous syneresis Vitreous syneresis       Fundus Exam      Right Left   Disc Pink and Sharp Pink and Sharp, mild tilt   C/D  Ratio 0.2 0.2   Macula Flat, Good foveal reflex, No heme or edema Flat, Good foveal reflex, No heme or edema   Vessels mild Tortuousity mild Tortuousity   Periphery Attached, mild lattice at 1200, pigmented lattice with atrophic holes at 0630-0700, atrophic hole at 1045; good laser changes from 10:30-01:00; also good laser changes surrounding lattice with holes 06:30-07:00, no new RT/RD Attached, pigmented lattice at 1230, round hole at 0330 with trace cuff of SRF, small hole at 0430, pigmented lattice at 0600 -- good early laser changes        Refraction    Wearing Rx      Sphere Cylinder Axis   Right -3.00 +0.75 177   Left -2.75 +0.75 177   Type: SVL          IMAGING AND PROCEDURES  Imaging and Procedures for @TODAY @  OCT, Retina - OU - Both Eyes       Right Eye Quality was good. Central Foveal Thickness: 268. Progression has been stable. Findings include normal foveal contour, no IRF, no SRF.   Left Eye Quality was good. Central Foveal Thickness: 270. Progression has been stable. Findings include normal foveal contour, no SRF, no IRF.   Notes *Images captured and stored on drive  Diagnosis / Impression:  NFP; no IRF/SRF OU--stable from prior   Clinical management:  See below  Abbreviations: NFP - Normal foveal profile. CME - cystoid macular edema. PED - pigment epithelial detachment. IRF - intraretinal fluid. SRF - subretinal fluid. EZ - ellipsoid zone. ERM - epiretinal membrane. ORA - outer retinal atrophy. ORT - outer retinal tubulation. 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 Retinal holes, bilateral  H33.323   3. Retinal edema  H35.81 OCT, Retina - OU - Both Eyes  4. Essential hypertension  I10   5. Hypertensive retinopathy of both eyes  H35.033   6. Myopia of both eyes with astigmatism  H52.13    H52.203     1-3. Lattice degeneration w/  atrophic holes, both eyes - scattered patches of lattice w/ atrophic holes OU  OD: hole at 1045, lattice from 11-1, and 630-7  OS: 1230, 0330, 0430-0600  - s/p laser retinopexy OD (07.15.20) - good laser changes surrounding patches of lattice degeneration and atrophic holes  - s/p laser retinopexy OS (07.27.20) - good  early laser changes  - discussed findings, prognosis, and treatment options including observation  - completed PF qid OS x7 days  - f/u 6 weeks  4,5. Hypertensive retinopathy OU  - discussed importance of tight BP control  - monitor  6. Myopia w/ astigmatism OU  - discussed association of myopia with lattice degeneration and increased risk of RT/RD   Ophthalmic Meds Ordered this visit:  No orders of the defined types were placed in this encounter.      Return in about 6 weeks (around 01/23/2019) for f/u lattice degeneration OU, DFE, OCT.  There are no Patient Instructions on file for this visit.   Explained the diagnoses, plan, and follow up with the patient and they expressed understanding.  Patient expressed understanding of the importance of proper follow up care.   This document serves as a record of services personally performed by Gardiner Sleeper, MD, PhD. It was created on their behalf by Ernest Mallick, OA, an ophthalmic assistant. The creation of this record is the provider's dictation and/or activities during the visit.    Electronically signed by: Ernest Mallick, OA  08.10.2020 11:42 PM     Gardiner Sleeper, M.D., Ph.D. Diseases & Surgery of the Retina and Vitreous [facility]   I have reviewed the above documentation for accuracy and completeness, and I agree with the above. Gardiner Sleeper, M.D., Ph.D. 12/12/18 11:44 PM     Abbreviations: M myopia (nearsighted); A astigmatism; H hyperopia (farsighted); P presbyopia; Mrx spectacle prescription;  CTL contact lenses; OD right eye; OS left eye; OU both eyes  XT exotropia; ET  esotropia; PEK punctate epithelial keratitis; PEE punctate epithelial erosions; DES dry eye syndrome; MGD meibomian gland dysfunction; ATs artificial tears; PFAT's preservative free artificial tears; [place] nuclear sclerotic cataract; PSC posterior subcapsular cataract; ERM epi-retinal membrane; PVD posterior vitreous detachment; RD retinal detachment; DM diabetes mellitus; DR diabetic retinopathy; NPDR non-proliferative diabetic retinopathy; PDR proliferative diabetic retinopathy; CSME clinically significant macular edema; DME diabetic macular edema; dbh dot blot hemorrhages; CWS cotton wool spot; POAG primary open angle glaucoma; C/D cup-to-disc ratio; HVF humphrey visual field; GVF goldmann visual field; OCT optical coherence tomography; IOP intraocular pressure; BRVO Branch retinal vein occlusion; CRVO central retinal vein occlusion; CRAO central retinal artery occlusion; BRAO branch retinal artery occlusion; RT retinal tear; SB scleral buckle; PPV pars plana vitrectomy; VH Vitreous hemorrhage; PRP panretinal laser photocoagulation; IVK intravitreal kenalog; VMT vitreomacular traction; MH Macular hole;  NVD neovascularization of the disc; NVE neovascularization elsewhere; AREDS age related eye disease study; ARMD age related macular degeneration; POAG primary open angle glaucoma; EBMD epithelial/anterior basement membrane dystrophy; ACIOL anterior chamber intraocular lens; IOL intraocular lens; PCIOL posterior chamber intraocular lens; Phaco/IOL phacoemulsification with intraocular lens placement; [place] photorefractive keratectomy; LASIK laser assisted in situ keratomileusis; HTN hypertension; DM diabetes mellitus; COPD chronic obstructive pulmonary disease

## 2018-12-21 DIAGNOSIS — H40013 Open angle with borderline findings, low risk, bilateral: Secondary | ICD-10-CM | POA: Diagnosis not present

## 2018-12-21 DIAGNOSIS — H04123 Dry eye syndrome of bilateral lacrimal glands: Secondary | ICD-10-CM | POA: Diagnosis not present

## 2018-12-21 DIAGNOSIS — H40053 Ocular hypertension, bilateral: Secondary | ICD-10-CM | POA: Diagnosis not present

## 2019-01-23 ENCOUNTER — Encounter (INDEPENDENT_AMBULATORY_CARE_PROVIDER_SITE_OTHER): Payer: BC Managed Care – PPO | Admitting: Ophthalmology

## 2019-01-25 DIAGNOSIS — H04123 Dry eye syndrome of bilateral lacrimal glands: Secondary | ICD-10-CM | POA: Diagnosis not present

## 2019-01-25 DIAGNOSIS — H1045 Other chronic allergic conjunctivitis: Secondary | ICD-10-CM | POA: Diagnosis not present

## 2019-01-25 DIAGNOSIS — H40013 Open angle with borderline findings, low risk, bilateral: Secondary | ICD-10-CM | POA: Diagnosis not present

## 2019-01-25 DIAGNOSIS — H40053 Ocular hypertension, bilateral: Secondary | ICD-10-CM | POA: Diagnosis not present

## 2019-02-17 ENCOUNTER — Other Ambulatory Visit: Payer: Self-pay

## 2019-02-17 MED ORDER — TRIAMTERENE-HCTZ 75-50 MG PO TABS: 1.0000 | ORAL_TABLET | Freq: Every day | ORAL | 5 refills | Status: DC

## 2019-02-20 ENCOUNTER — Ambulatory Visit (HOSPITAL_COMMUNITY): Payer: Self-pay

## 2019-02-22 ENCOUNTER — Ambulatory Visit: Payer: Self-pay | Admitting: Family Medicine

## 2019-02-23 ENCOUNTER — Ambulatory Visit (HOSPITAL_COMMUNITY): Payer: BC Managed Care – PPO

## 2019-02-27 ENCOUNTER — Encounter (INDEPENDENT_AMBULATORY_CARE_PROVIDER_SITE_OTHER): Payer: BC Managed Care – PPO | Admitting: Ophthalmology

## 2019-02-27 ENCOUNTER — Encounter (INDEPENDENT_AMBULATORY_CARE_PROVIDER_SITE_OTHER): Payer: Self-pay

## 2019-02-27 ENCOUNTER — Other Ambulatory Visit: Payer: Self-pay

## 2019-03-01 ENCOUNTER — Ambulatory Visit: Payer: Self-pay | Admitting: Family Medicine

## 2019-03-07 ENCOUNTER — Encounter: Payer: Self-pay | Admitting: Family Medicine

## 2019-03-07 ENCOUNTER — Ambulatory Visit (INDEPENDENT_AMBULATORY_CARE_PROVIDER_SITE_OTHER): Payer: BC Managed Care – PPO | Admitting: Family Medicine

## 2019-03-07 ENCOUNTER — Other Ambulatory Visit: Payer: Self-pay

## 2019-03-07 VITALS — BP 124/88 | HR 96 | Temp 98.7°F | Ht 64.0 in | Wt 294.0 lb

## 2019-03-07 DIAGNOSIS — Z23 Encounter for immunization: Secondary | ICD-10-CM | POA: Diagnosis not present

## 2019-03-07 DIAGNOSIS — F322 Major depressive disorder, single episode, severe without psychotic features: Secondary | ICD-10-CM | POA: Insufficient documentation

## 2019-03-07 DIAGNOSIS — I10 Essential (primary) hypertension: Secondary | ICD-10-CM | POA: Diagnosis not present

## 2019-03-07 MED ORDER — PHENTERMINE HCL 37.5 MG PO TABS: ORAL_TABLET | ORAL | 3 refills | Status: DC

## 2019-03-07 MED ORDER — VENLAFAXINE HCL ER 75 MG PO CP24: 75.0000 mg | ORAL_CAPSULE | Freq: Every day | ORAL | 3 refills | Status: DC
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
## 2019-03-07 NOTE — Patient Instructions (Addendum)
Annual physical exam with MD in 2 monhts, call if you need me before  Flu vaccine today  PLEASE RESCHEDULE PATIENT'S MAMMOGRAM AT CHECKOUT  New medication for depression and you are referered to therapist starting in office today before you leave ( video/conference  Please get fasting labs as soon as possible  It is important that you exercise regularly at least 30 minutes 5 times a week. If you develop chest pain, have severe difficulty breathing, or feel very tired, stop exercising immediately and seek medical attention   Think about what you will eat, plan ahead. Choose " clean, green, fresh or frozen" over canned, processed or packaged foods which are more sugary, salty and fatty. 70 to 75% of food eaten should be vegetables and fruit. Three meals at set times with snacks allowed between meals, but they must be fruit or vegetables. Aim to eat over a 12 hour period , example 7 am to 7 pm, and STOP after  your last meal of the day. Drink water,generally about 64 ounces per day, no other drink is as healthy. Fruit juice is best enjoyed in a healthy way, by EATING the fruit.

## 2019-03-08 ENCOUNTER — Encounter: Payer: Self-pay | Admitting: Family Medicine

## 2019-03-08 NOTE — Assessment & Plan Note (Signed)
Start effexor and refer to therapy 

## 2019-03-08 NOTE — Assessment & Plan Note (Signed)
Deteriorated, triffered by unresolved stress and depresson  Patient re-educated about  the importance of commitment to a  minimum of 150 minutes of exercise per week as able.  The importance of healthy food choices with portion control discussed, as well as eating regularly and within a 12 hour window most days. The need to choose "clean , green" food 50 to 75% of the time is discussed, as well as to make water the primary drink and set a goal of 64 ounces water daily.    Weight /BMI 03/07/2019 12/05/2018 03/24/2018  WEIGHT 294 lb 274 lb 282 lb  HEIGHT 5\' 4"  5\' 4"  5\' 4"   BMI 50.46 kg/m2 47.03 kg/m2 48.41 kg/m2

## 2019-03-08 NOTE — Assessment & Plan Note (Signed)
Controlled, no change in medication DASH diet and commitment to daily physical activity for a minimum of 30 minutes discussed and encouraged, as a part of hypertension management. The importance of attaining a healthy weight is also discussed.  BP/Weight 03/07/2019 12/05/2018 03/24/2018 11/18/2017 06/28/2017 02/15/2017 Q000111Q  Systolic BP A999333 Q000111Q Q000111Q 123456 A999333 [id number]  Diastolic BP 88 84 84 88 84 94 98  Wt. (Lbs) 294 274 282 272.08 286 291.5 294  BMI 50.46 47.03 48.41 46.7 49.09 50.04 50.46

## 2019-03-16 ENCOUNTER — Inpatient Hospital Stay (HOSPITAL_COMMUNITY): Admission: RE | Admit: 2019-03-16 | Payer: BC Managed Care – PPO | Source: Ambulatory Visit

## 2019-03-24 ENCOUNTER — Other Ambulatory Visit: Payer: Self-pay

## 2019-03-24 DIAGNOSIS — Z20822 Contact with and (suspected) exposure to covid-19: Secondary | ICD-10-CM

## 2019-03-27 ENCOUNTER — Telehealth: Payer: Self-pay

## 2019-03-27 LAB — NOVEL CORONAVIRUS, NAA: SARS-CoV-2, NAA: NOT DETECTED

## 2019-03-27 NOTE — Telephone Encounter (Signed)
Patient given negative result and verbalized understanding  

## 2019-04-24 ENCOUNTER — Other Ambulatory Visit: Payer: Self-pay

## 2019-04-24 ENCOUNTER — Telehealth: Payer: Self-pay | Admitting: *Deleted

## 2019-04-24 MED ORDER — TRIAMTERENE-HCTZ 75-50 MG PO TABS: 1.0000 | ORAL_TABLET | Freq: Every day | ORAL | 5 refills | Status: DC

## 2019-04-24 MED ORDER — VENLAFAXINE HCL ER 75 MG PO CP24: 75.0000 mg | ORAL_CAPSULE | Freq: Every day | ORAL | 3 refills | Status: DC

## 2019-04-24 MED ORDER — ERGOCALCIFEROL 1.25 MG (50000 UT) PO CAPS: 50000.0000 [IU] | ORAL_CAPSULE | ORAL | 3 refills | Status: DC

## 2019-04-24 NOTE — Telephone Encounter (Signed)
Prescriptions sent to [facility].  

## 2019-04-24 NOTE — Telephone Encounter (Signed)
Pts insurance changed and she needs all of her prescriptions sent to [facility] pharmacy they were at [facility] but she has new insurance and cannot get them filled there anymore.

## 2019-04-27 ENCOUNTER — Emergency Department (HOSPITAL_COMMUNITY)
Admission: EM | Admit: 2019-04-27 | Discharge: 2019-04-27 | Disposition: A | Discharge status: Home / Self Care | Payer: BC Managed Care – PPO | Attending: Emergency Medicine | Admitting: Emergency Medicine

## 2019-04-27 ENCOUNTER — Other Ambulatory Visit: Payer: Self-pay

## 2019-04-27 ENCOUNTER — Encounter (HOSPITAL_COMMUNITY): Payer: Self-pay | Admitting: Emergency Medicine

## 2019-04-27 DIAGNOSIS — Y939 Activity, unspecified: Secondary | ICD-10-CM | POA: Diagnosis not present

## 2019-04-27 DIAGNOSIS — Y92410 Unspecified street and highway as the place of occurrence of the external cause: Secondary | ICD-10-CM | POA: Diagnosis not present

## 2019-04-27 DIAGNOSIS — S161XXA Strain of muscle, fascia and tendon at neck level, initial encounter: Secondary | ICD-10-CM | POA: Diagnosis not present

## 2019-04-27 DIAGNOSIS — S199XXA Unspecified injury of neck, initial encounter: Secondary | ICD-10-CM | POA: Diagnosis not present

## 2019-04-27 DIAGNOSIS — I10 Essential (primary) hypertension: Secondary | ICD-10-CM | POA: Diagnosis not present

## 2019-04-27 DIAGNOSIS — Y999 Unspecified external cause status: Secondary | ICD-10-CM | POA: Insufficient documentation

## 2019-04-27 MED ORDER — CYCLOBENZAPRINE HCL 10 MG PO TABS: 10.0000 mg | ORAL_TABLET | Freq: Two times a day (BID) | ORAL | 0 refills | Status: DC | PRN

## 2019-04-27 MED ORDER — LIDOCAINE 5 % EX PTCH: 1.0000 | MEDICATED_PATCH | CUTANEOUS | 0 refills | Status: DC
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
## 2019-04-27 NOTE — Discharge Instructions (Addendum)
You were evaluated in the Emergency Department and after careful evaluation, we did not find any emergent condition requiring admission or further testing in the hospital.  Your exam/testing today was overall reassuring.  Your symptoms seem to be due to strain or sprain of the muscles of your neck.  You may be more sore tomorrow.  We recommend 1000 mg Tylenol every 6-8 hours, as well as 600 mg Motrin every 6-8 hours.  You can also use the Lidoderm patches for pain during the day.  We have also provided Flexeril muscle relaxer to be taken only at night if you are having trouble sleeping due to the pain.  This medication can cause drowsiness so please do not use during the day.  Please return to the Emergency Department if you experience any worsening of your condition.  We encourage you to follow up with a primary care provider.  Thank you for allowing [place] to be a part of your care.

## 2019-04-30 ENCOUNTER — Encounter (HOSPITAL_COMMUNITY): Payer: Self-pay

## 2019-04-30 ENCOUNTER — Other Ambulatory Visit: Payer: Self-pay

## 2019-04-30 ENCOUNTER — Emergency Department (HOSPITAL_COMMUNITY)
Admission: EM | Admit: 2019-04-30 | Discharge: 2019-04-30 | Disposition: A | Discharge status: Home / Self Care | Payer: BC Managed Care – PPO | Attending: Emergency Medicine | Admitting: Emergency Medicine

## 2019-04-30 DIAGNOSIS — S39012A Strain of muscle, fascia and tendon of lower back, initial encounter: Secondary | ICD-10-CM | POA: Insufficient documentation

## 2019-04-30 DIAGNOSIS — Y9389 Activity, other specified: Secondary | ICD-10-CM | POA: Insufficient documentation

## 2019-04-30 DIAGNOSIS — I10 Essential (primary) hypertension: Secondary | ICD-10-CM | POA: Diagnosis not present

## 2019-04-30 DIAGNOSIS — Y999 Unspecified external cause status: Secondary | ICD-10-CM | POA: Insufficient documentation

## 2019-04-30 DIAGNOSIS — S39012D Strain of muscle, fascia and tendon of lower back, subsequent encounter: Secondary | ICD-10-CM

## 2019-04-30 DIAGNOSIS — Z79899 Other long term (current) drug therapy: Secondary | ICD-10-CM | POA: Diagnosis not present

## 2019-04-30 DIAGNOSIS — M542 Cervicalgia: Secondary | ICD-10-CM | POA: Diagnosis not present

## 2019-04-30 DIAGNOSIS — S3992XA Unspecified injury of lower back, initial encounter: Secondary | ICD-10-CM | POA: Diagnosis not present

## 2019-04-30 DIAGNOSIS — Y92410 Unspecified street and highway as the place of occurrence of the external cause: Secondary | ICD-10-CM | POA: Diagnosis not present

## 2019-04-30 MED ORDER — NAPROXEN 500 MG PO TABS: 500.0000 mg | ORAL_TABLET | Freq: Two times a day (BID) | ORAL | 0 refills | Status: DC

## 2019-04-30 MED ORDER — CYCLOBENZAPRINE HCL 10 MG PO TABS: 10.0000 mg | ORAL_TABLET | Freq: Three times a day (TID) | ORAL | 0 refills | Status: DC | PRN

## 2019-04-30 NOTE — ED Notes (Signed)
Discharge instructions reviewed with patient, patient given copy and verbalized understanding. Pt unable to sign in triage due to signature pad being down.
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
## 2019-04-30 NOTE — Discharge Instructions (Signed)
I have refilled your muscle relaxer and added a prescription strength anti-inflammatory.  Please take the medications exactly as prescribed, be aware that she may hurt for 2 to 3 weeks but this should gradually improve.  Make sure that you are drinking plenty of clear liquids.  Rest for another 48 hours and then had back to work.
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
## 2019-05-03 ENCOUNTER — Ambulatory Visit (INDEPENDENT_AMBULATORY_CARE_PROVIDER_SITE_OTHER): Payer: BC Managed Care – PPO | Admitting: Family Medicine

## 2019-05-03 ENCOUNTER — Other Ambulatory Visit: Payer: Self-pay

## 2019-05-03 ENCOUNTER — Other Ambulatory Visit: Payer: Self-pay | Admitting: Family Medicine

## 2019-05-03 ENCOUNTER — Encounter: Payer: Self-pay | Admitting: Family Medicine

## 2019-05-03 DIAGNOSIS — I10 Essential (primary) hypertension: Secondary | ICD-10-CM | POA: Diagnosis not present

## 2019-05-03 DIAGNOSIS — Z111 Encounter for screening for respiratory tuberculosis: Secondary | ICD-10-CM | POA: Diagnosis not present

## 2019-05-03 MED ORDER — PHENTERMINE HCL 37.5 MG PO TABS: ORAL_TABLET | ORAL | 1 refills | Status: DC

## 2019-05-03 MED ORDER — HYDROCODONE-ACETAMINOPHEN 5-325 MG PO TABS: ORAL_TABLET | ORAL | 0 refills | Status: AC

## 2019-05-03 MED ORDER — IBUPROFEN 800 MG PO TABS: 800.0000 mg | ORAL_TABLET | Freq: Three times a day (TID) | ORAL | 0 refills | Status: DC

## 2019-05-03 MED ORDER — KETOROLAC TROMETHAMINE 60 MG/2ML IM SOLN
60.0000 mg | Freq: Once | INTRAMUSCULAR | Status: AC
  Administered 2019-05-03: 17:00:00 60 mg via INTRAMUSCULAR

## 2019-05-03 NOTE — Progress Notes (Signed)
Phentermine 37.5

## 2019-05-03 NOTE — Assessment & Plan Note (Signed)
Controlled, no change in medication  

## 2019-05-03 NOTE — Assessment & Plan Note (Signed)
  Patient re-educated about  the importance of commitment to a  minimum of 150 minutes of exercise per week as able.  The importance of healthy food choices with portion control discussed, as well as eating regularly and within a 12 hour window most days. The need to choose "clean , green" food 50 to 75% of the time is discussed, as well as to make water the primary drink and set a goal of 64 ounces water daily.    Weight /BMI 05/03/2019 04/30/2019 04/27/2019  WEIGHT 289 lb 275 lb 9.2 oz 274 lb  HEIGHT 5\' 4"  5\' 4"  5\' 4"   BMI 49.61 kg/m2 47.3 kg/m2 47.03 kg/m2   Phentermine  Is re e sent to her pharmacy of choice
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
## 2019-05-04 ENCOUNTER — Telehealth: Payer: Self-pay | Admitting: *Deleted

## 2019-05-04 NOTE — Telephone Encounter (Signed)
Pt called said her work note was written to return to work 05/11/19 however her chiopractor appt is not until 05/12/19 so she wanted to see if Dr Moshe Cipro would update her work note. Also she received her tb test and said the nurse at work could read this but she needs the paper work emailed to her at [email or web address]

## 2019-05-04 NOTE — Telephone Encounter (Signed)
Sent to her email

## 2019-05-08 ENCOUNTER — Telehealth: Payer: Self-pay | Admitting: *Deleted

## 2019-05-08 NOTE — Telephone Encounter (Signed)
Pt said she got the tb skin test and did not get it read on Friday as she did not have any paperwork. She was wondering what needed to be done with this. Also she was wondering if Dr Moshe Cipro would extend her work note to 05/12/19 as she will see the chiropractor on 05/12/19. She was set to return 05/11/19 just needed something to cover her until she can see the chiropractor.

## 2019-05-08 NOTE — Telephone Encounter (Signed)
Patient aware and will be seen by ortho not chiro

## 2019-05-12 DIAGNOSIS — M542 Cervicalgia: Secondary | ICD-10-CM | POA: Diagnosis not present

## 2019-05-12 DIAGNOSIS — M545 Low back pain: Secondary | ICD-10-CM | POA: Diagnosis not present

## 2019-05-15 ENCOUNTER — Telehealth: Payer: Self-pay | Admitting: *Deleted

## 2019-05-15 NOTE — Telephone Encounter (Signed)
Disability form received via fax copied noted sleeved

## 2019-05-21 ENCOUNTER — Other Ambulatory Visit: Payer: Self-pay | Admitting: Family Medicine

## 2019-06-01 DIAGNOSIS — I10 Essential (primary) hypertension: Secondary | ICD-10-CM

## 2019-06-09 DIAGNOSIS — M542 Cervicalgia: Secondary | ICD-10-CM | POA: Diagnosis not present

## 2019-06-09 DIAGNOSIS — M545 Low back pain: Secondary | ICD-10-CM | POA: Diagnosis not present

## 2019-06-12 DIAGNOSIS — M542 Cervicalgia: Secondary | ICD-10-CM | POA: Diagnosis not present

## 2019-06-12 DIAGNOSIS — M2560 Stiffness of unspecified joint, not elsewhere classified: Secondary | ICD-10-CM | POA: Diagnosis not present

## 2019-06-12 DIAGNOSIS — M6281 Muscle weakness (generalized): Secondary | ICD-10-CM | POA: Diagnosis not present

## 2019-06-12 DIAGNOSIS — M545 Low back pain: Secondary | ICD-10-CM | POA: Diagnosis not present

## 2019-06-14 DIAGNOSIS — M545 Low back pain: Secondary | ICD-10-CM | POA: Diagnosis not present

## 2019-06-14 DIAGNOSIS — M6281 Muscle weakness (generalized): Secondary | ICD-10-CM | POA: Diagnosis not present

## 2019-06-14 DIAGNOSIS — M542 Cervicalgia: Secondary | ICD-10-CM | POA: Diagnosis not present

## 2019-06-14 DIAGNOSIS — M2560 Stiffness of unspecified joint, not elsewhere classified: Secondary | ICD-10-CM | POA: Diagnosis not present

## 2019-06-16 DIAGNOSIS — M2560 Stiffness of unspecified joint, not elsewhere classified: Secondary | ICD-10-CM | POA: Diagnosis not present

## 2019-06-16 DIAGNOSIS — M545 Low back pain: Secondary | ICD-10-CM | POA: Diagnosis not present

## 2019-06-16 DIAGNOSIS — M6281 Muscle weakness (generalized): Secondary | ICD-10-CM | POA: Diagnosis not present

## 2019-06-20 ENCOUNTER — Ambulatory Visit: Payer: BC Managed Care – PPO | Admitting: Family Medicine

## 2019-06-21 DIAGNOSIS — M2560 Stiffness of unspecified joint, not elsewhere classified: Secondary | ICD-10-CM | POA: Diagnosis not present

## 2019-06-21 DIAGNOSIS — M545 Low back pain: Secondary | ICD-10-CM | POA: Diagnosis not present

## 2019-06-21 DIAGNOSIS — M542 Cervicalgia: Secondary | ICD-10-CM | POA: Diagnosis not present

## 2019-06-21 DIAGNOSIS — M6281 Muscle weakness (generalized): Secondary | ICD-10-CM | POA: Diagnosis not present

## 2019-06-23 DIAGNOSIS — M6281 Muscle weakness (generalized): Secondary | ICD-10-CM | POA: Diagnosis not present

## 2019-06-23 DIAGNOSIS — M2560 Stiffness of unspecified joint, not elsewhere classified: Secondary | ICD-10-CM | POA: Diagnosis not present

## 2019-06-23 DIAGNOSIS — M542 Cervicalgia: Secondary | ICD-10-CM | POA: Diagnosis not present

## 2019-06-23 DIAGNOSIS — M545 Low back pain: Secondary | ICD-10-CM | POA: Diagnosis not present

## 2019-06-26 ENCOUNTER — Encounter: Payer: Self-pay | Admitting: Family Medicine

## 2019-06-26 ENCOUNTER — Other Ambulatory Visit: Payer: Self-pay

## 2019-06-26 ENCOUNTER — Ambulatory Visit (INDEPENDENT_AMBULATORY_CARE_PROVIDER_SITE_OTHER): Payer: BC Managed Care – PPO | Admitting: Family Medicine

## 2019-06-26 VITALS — BP 118/86 | HR 100 | Temp 97.8°F | Resp 15 | Ht 64.0 in | Wt 279.1 lb

## 2019-06-26 DIAGNOSIS — E559 Vitamin D deficiency, unspecified: Secondary | ICD-10-CM | POA: Diagnosis not present

## 2019-06-26 DIAGNOSIS — R7301 Impaired fasting glucose: Secondary | ICD-10-CM | POA: Diagnosis not present

## 2019-06-26 DIAGNOSIS — Z13220 Encounter for screening for lipoid disorders: Secondary | ICD-10-CM

## 2019-06-26 DIAGNOSIS — I10 Essential (primary) hypertension: Secondary | ICD-10-CM | POA: Diagnosis not present

## 2019-06-26 DIAGNOSIS — Z79899 Other long term (current) drug therapy: Secondary | ICD-10-CM | POA: Diagnosis not present

## 2019-06-26 DIAGNOSIS — D539 Nutritional anemia, unspecified: Secondary | ICD-10-CM | POA: Diagnosis not present

## 2019-06-26 DIAGNOSIS — R7303 Prediabetes: Secondary | ICD-10-CM

## 2019-06-26 MED ORDER — PHENTERMINE HCL 37.5 MG PO TABS: 37.5000 mg | ORAL_TABLET | Freq: Every day | ORAL | 2 refills | Status: DC

## 2019-06-26 NOTE — Assessment & Plan Note (Signed)
Controlled, no change in medication DASH diet and commitment to daily physical activity for a minimum of 30 minutes discussed and encouraged, as a part of hypertension management. The importance of attaining a healthy weight is also discussed.  BP/Weight 06/26/2019 05/03/2019 04/30/2019 04/27/2019 03/07/2019 12/05/2018 [id number]  Systolic BP 123456 0000000 123456 Q000111Q A999333 Q000111Q Q000111Q  Diastolic BP 86 84 A999333 90 88 84 84  Wt. (Lbs) 279.12 289 275.58 274 294 274 282  BMI 47.91 49.61 47.3 47.03 50.46 47.03 48.41

## 2019-06-26 NOTE — Assessment & Plan Note (Signed)
  Patient re-educated about  the importance of commitment to a  minimum of 150 minutes of exercise per week as able.  The importance of healthy food choices with portion control discussed, as well as eating regularly and within a 12 hour window most days. The need to choose "clean , green" food 50 to 75% of the time is discussed, as well as to make water the primary drink and set a goal of 64 ounces water daily.    Weight /BMI 06/26/2019 05/03/2019 04/30/2019  WEIGHT 279 lb 1.9 oz 289 lb 275 lb 9.2 oz  HEIGHT 5\' 4"  5\' 4"  5\' 4"   BMI 47.91 kg/m2 49.61 kg/m2 47.3 kg/m2    Continue phentermine as before

## 2019-06-26 NOTE — Assessment & Plan Note (Signed)
Patient educated about the importance of limiting  Carbohydrate intake , the need to commit to daily physical activity for a minimum of 30 minutes , and to commit weight loss. The fact that changes in all these areas will reduce or eliminate all together the development of diabetes is stressed.   Diabetic Labs Latest Ref Rng & Units 06/26/2019 11/18/2017 06/04/2016 01/29/2015 09/07/2014  HbA1c <5.7 % - - 5.8(H) 6.0(H) 5.9(H)  Chol <200 mg/dL 173 - 157 - 155  HDL > OR = 50 mg/dL 62 - 64 - 70  Calc LDL mg/dL (calc) 96 - 83 - 75  Triglycerides <150 mg/dL 61 - 49 - 49  Creatinine 0.50 - 1.10 mg/dL 1.09 0.76 0.76 0.73 0.76   BP/Weight 06/26/2019 05/03/2019 04/30/2019 04/27/2019 03/07/2019 12/05/2018 [id number]  Systolic BP 123456 0000000 123456 Q000111Q A999333 Q000111Q Q000111Q  Diastolic BP 86 84 A999333 90 88 84 84  Wt. (Lbs) 279.12 289 275.58 274 294 274 282  BMI 47.91 49.61 47.3 47.03 50.46 47.03 48.41   No flowsheet data found.  Updated lab needed at/ before next visit.

## 2019-06-26 NOTE — Progress Notes (Signed)
Rose Mcguire     MRN: [id number]      DOB: 1975/07/13   HPI Ms. Gotts is here for follow up and re-evaluation of chronic medical conditions, medication management and review of any available recent lab and radiology data.  Preventive health is updated, specifically  Cancer screening and Immunization.   Questions or concerns regarding consultations or procedures which the PT has had in the interim are  addressed. The PT denies any adverse reactions to current medications since the last visit.  Back pain is improving with therapy, she is under care of Ortho, still uses at bedtime maybe 2 to  3 days/ week esp when no therapy, ibuprofen or lidoderm patch Expect to return to work Next Tuesday ,. July 04, 2019   ROS Denies recent fever or chills. Denies sinus pressure, nasal congestion, ear pain or sore throat. Denies chest congestion, productive cough or wheezing. Denies chest pains, palpitations and leg swelling Denies abdominal pain, nausea, vomiting,diarrhea or constipation.   Denies dysuria, frequency, hesitancy or incontinencDenies headaches, seizures, numbness, or tingling.  Denies skin break down or rash.   PE  BP 118/86   Pulse 100   Temp 97.8 F (36.6 C) (Temporal)   Resp 15   Ht 5\' 4"  (1.626 m)   Wt 279 lb 1.9 oz (126.6 kg)   SpO2 97%   BMI 47.91 kg/m   Patient alert and oriented and in no cardiopulmonary distress.  HEENT: No facial asymmetry, EOMI,     Neck supple .  Chest: Clear to auscultation bilaterally.  CVS: S1, S2 no murmurs, no S3.Regular rate.  ABD: Soft non tender.   Ext: No edema  MS: Adequate ROM spine, reduced in lumbar spine however, normal shoulders, hips and knees.  Skin: Intact, no ulcerations or rash noted.  Psych: Good eye contact, normal affect. Memory intact not anxious or depressed appearing.  CNS: CN 2-12 intact, power,  normal throughout.no focal deficits noted.   Assessment & Plan  Essential hypertension Controlled, no  change in medication DASH diet and commitment to daily physical activity for a minimum of 30 minutes discussed and encouraged, as a part of hypertension management. The importance of attaining a healthy weight is also discussed.  BP/Weight 06/26/2019 05/03/2019 04/30/2019 04/27/2019 03/07/2019 12/05/2018 [id number]  Systolic BP 123456 0000000 123456 Q000111Q A999333 Q000111Q Q000111Q  Diastolic BP 86 84 A999333 90 88 84 84  Wt. (Lbs) 279.12 289 275.58 274 294 274 282  BMI 47.91 49.61 47.3 47.03 50.46 47.03 48.41       Morbid obesity  Patient re-educated about  the importance of commitment to a  minimum of 150 minutes of exercise per week as able.  The importance of healthy food choices with portion control discussed, as well as eating regularly and within a 12 hour window most days. The need to choose "clean , green" food 50 to 75% of the time is discussed, as well as to make water the primary drink and set a goal of 64 ounces water daily.    Weight /BMI 06/26/2019 05/03/2019 04/30/2019  WEIGHT 279 lb 1.9 oz 289 lb 275 lb 9.2 oz  HEIGHT 5\' 4"  5\' 4"  5\' 4"   BMI 47.91 kg/m2 49.61 kg/m2 47.3 kg/m2    Continue phentermine as before  Prediabetes Patient educated about the importance of limiting  Carbohydrate intake , the need to commit to daily physical activity for a minimum of 30 minutes , and to commit weight loss. The fact that changes in  all these areas will reduce or eliminate all together the development of diabetes is stressed.   Diabetic Labs Latest Ref Rng & Units 06/26/2019 11/18/2017 06/04/2016 01/29/2015 09/07/2014  HbA1c <5.7 % - - 5.8(H) 6.0(H) 5.9(H)  Chol <200 mg/dL 173 - 157 - 155  HDL > OR = 50 mg/dL 62 - 64 - 70  Calc LDL mg/dL (calc) 96 - 83 - 75  Triglycerides <150 mg/dL 61 - 49 - 49  Creatinine 0.50 - 1.10 mg/dL 1.09 0.76 0.76 0.73 0.76   BP/Weight 06/26/2019 05/03/2019 04/30/2019 04/27/2019 03/07/2019 12/05/2018 [id number]  Systolic BP 123456 0000000 123456 Q000111Q A999333 Q000111Q Q000111Q  Diastolic BP 86 84 A999333 90 88 84 84   Wt. (Lbs) 279.12 289 275.58 274 294 274 282  BMI 47.91 49.61 47.3 47.03 50.46 47.03 48.41   No flowsheet data found.  Updated lab needed at/ before next visit.   Vitamin D deficiency Updated lab needed at/ before next visit.

## 2019-06-26 NOTE — Patient Instructions (Addendum)
Annual physical exam  in office with MD  in 12 weeks, call if you need me sooner  PLEASE SCHEDULE OVERDUE MAMMOGRAM AT CHECKOUT, PLS TRY FOR THIS WEEK AS PT IS OUT OF WORK  CONGRATS on weight loss, keep it up  New is phentermine one DAILY, GOAL IS 6 TO 8 POUNDS PER MONTH OF WEIGHT LOSS   LABS ALREADY ORDERED THIS MORNING PLEASE  Think about what you will eat, plan ahead. Choose " clean, green, fresh or frozen" over canned, processed or packaged foods which are more sugary, salty and fatty. 70 to 75% of food eaten should be vegetables and fruit. Three meals at set times with snacks allowed between meals, but they must be fruit or vegetables. Aim to eat over a 12 hour period , example 7 am to 7 pm, and STOP after  your last meal of the day. Drink water,generally about 64 ounces per day, no other drink is as healthy. Fruit juice is best enjoyed in a healthy way, by EATING the fruit. Thanks for choosing [facility], we consider it a privelige to serve you.

## 2019-06-26 NOTE — Assessment & Plan Note (Signed)
Updated lab needed at/ before next visit.   

## 2019-06-27 DIAGNOSIS — M542 Cervicalgia: Secondary | ICD-10-CM | POA: Diagnosis not present

## 2019-06-27 DIAGNOSIS — M6281 Muscle weakness (generalized): Secondary | ICD-10-CM | POA: Diagnosis not present

## 2019-06-27 DIAGNOSIS — M545 Low back pain: Secondary | ICD-10-CM | POA: Diagnosis not present

## 2019-06-27 DIAGNOSIS — M2560 Stiffness of unspecified joint, not elsewhere classified: Secondary | ICD-10-CM | POA: Diagnosis not present

## 2019-06-28 LAB — HEMOGLOBIN A1C
Hgb A1c MFr Bld: 6.2 % of total Hgb — ABNORMAL HIGH (ref <5.7)
Mean Plasma Glucose: 131 (calc)
eAG (mmol/L): 7.3 (calc)

## 2019-06-28 LAB — FERRITIN: Ferritin: 53 ng/mL (ref 16–232)

## 2019-06-28 LAB — COMPLETE METABOLIC PANEL WITH GFR
AG Ratio: 1.1 (calc) (ref 1.0–2.5)
ALT: 8 U/L (ref 6–29)
AST: 8 U/L — ABNORMAL LOW (ref 10–30)
Albumin: 4.3 g/dL (ref 3.6–5.1)
Alkaline phosphatase (APISO): 93 U/L (ref 31–125)
BUN: 13 mg/dL (ref 7–25)
CO2: 34 mmol/L — ABNORMAL HIGH (ref 20–32)
Calcium: 9.8 mg/dL (ref 8.6–10.2)
Chloride: 96 mmol/L — ABNORMAL LOW (ref 98–110)
Creat: 1.09 mg/dL (ref 0.50–1.10)
GFR, Est African American: 73 mL/min/{1.73_m2} (ref >=60)
GFR, Est Non African American: 63 mL/min/{1.73_m2} (ref >=60)
Globulin: 4 g/dL (calc) — ABNORMAL HIGH (ref 1.9–3.7)
Glucose, Bld: 105 mg/dL — ABNORMAL HIGH (ref 65–99)
Potassium: 3.8 mmol/L (ref 3.5–5.3)
Sodium: 138 mmol/L (ref 135–146)
Total Bilirubin: 0.4 mg/dL (ref 0.2–1.2)
Total Protein: 8.3 g/dL — ABNORMAL HIGH (ref 6.1–8.1)

## 2019-06-28 LAB — CBC
HCT: 36.7 % (ref 35.0–45.0)
Hemoglobin: 11.8 g/dL (ref 11.7–15.5)
MCH: 26.2 pg — ABNORMAL LOW (ref 27.0–33.0)
MCHC: 32.2 g/dL (ref 32.0–36.0)
MCV: 81.6 fL (ref 80.0–100.0)
MPV: 8.8 fL (ref 7.5–12.5)
Platelets: 551 10*3/uL — ABNORMAL HIGH (ref 140–400)
RBC: 4.5 10*6/uL (ref 3.80–5.10)
RDW: 13.6 % (ref 11.0–15.0)
WBC: 7.9 10*3/uL (ref 3.8–10.8)

## 2019-06-28 LAB — LIPID PANEL
Cholesterol: 173 mg/dL (ref <200)
HDL: 62 mg/dL (ref >=50)
LDL Cholesterol (Calc): 96 mg/dL (calc)
Non-HDL Cholesterol (Calc): 111 mg/dL (calc) (ref <130)
Total CHOL/HDL Ratio: 2.8 (calc) (ref <5.0)
Triglycerides: 61 mg/dL (ref <150)

## 2019-06-28 LAB — TEST AUTHORIZATION

## 2019-06-28 LAB — VITAMIN D 25 HYDROXY (VIT D DEFICIENCY, FRACTURES): Vit D, 25-Hydroxy: 10 ng/mL — ABNORMAL LOW (ref 30–100)

## 2019-06-28 LAB — TSH: TSH: 1.74 mIU/L

## 2019-06-28 LAB — IRON: Iron: 42 ug/dL (ref 40–190)

## 2019-06-30 ENCOUNTER — Ambulatory Visit (HOSPITAL_COMMUNITY): Payer: BC Managed Care – PPO

## 2019-06-30 ENCOUNTER — Other Ambulatory Visit: Payer: Self-pay

## 2019-06-30 ENCOUNTER — Ambulatory Visit (HOSPITAL_COMMUNITY)
Admission: RE | Admit: 2019-06-30 | Discharge: 2019-06-30 | Disposition: A | Discharge status: Home / Self Care | Payer: BC Managed Care – PPO | Source: Ambulatory Visit | Attending: Family Medicine | Admitting: Family Medicine

## 2019-06-30 DIAGNOSIS — Z1231 Encounter for screening mammogram for malignant neoplasm of breast: Secondary | ICD-10-CM | POA: Insufficient documentation

## 2019-06-30 DIAGNOSIS — M6281 Muscle weakness (generalized): Secondary | ICD-10-CM | POA: Diagnosis not present

## 2019-06-30 DIAGNOSIS — M542 Cervicalgia: Secondary | ICD-10-CM | POA: Diagnosis not present

## 2019-06-30 DIAGNOSIS — M545 Low back pain: Secondary | ICD-10-CM | POA: Diagnosis not present

## 2019-07-03 ENCOUNTER — Other Ambulatory Visit: Payer: Self-pay | Admitting: Family Medicine

## 2019-07-03 DIAGNOSIS — M542 Cervicalgia: Secondary | ICD-10-CM | POA: Diagnosis not present

## 2019-07-03 MED ORDER — FERROUS SULFATE 325 (65 FE) MG PO TABS: 325.0000 mg | ORAL_TABLET | Freq: Every day | ORAL | 1 refills | Status: DC

## 2019-07-03 NOTE — Progress Notes (Signed)
Advised pt of labs and recommendations she does have vitamin D already and will start taking this and will pick up other prescription with verbal understanding

## 2019-07-04 DIAGNOSIS — M542 Cervicalgia: Secondary | ICD-10-CM | POA: Diagnosis not present

## 2019-07-04 DIAGNOSIS — M6281 Muscle weakness (generalized): Secondary | ICD-10-CM | POA: Diagnosis not present

## 2019-07-04 DIAGNOSIS — M545 Low back pain: Secondary | ICD-10-CM | POA: Diagnosis not present

## 2019-07-05 DIAGNOSIS — H00024 Hordeolum internum left upper eyelid: Secondary | ICD-10-CM | POA: Diagnosis not present

## 2019-07-06 ENCOUNTER — Encounter: Payer: BC Managed Care – PPO | Admitting: Family Medicine

## 2019-07-06 DIAGNOSIS — M542 Cervicalgia: Secondary | ICD-10-CM | POA: Diagnosis not present

## 2019-07-06 DIAGNOSIS — M6281 Muscle weakness (generalized): Secondary | ICD-10-CM | POA: Diagnosis not present

## 2019-07-06 DIAGNOSIS — M545 Low back pain: Secondary | ICD-10-CM | POA: Diagnosis not present

## 2019-09-21 ENCOUNTER — Encounter: Payer: BC Managed Care – PPO | Admitting: Family Medicine

## 2019-10-05 ENCOUNTER — Encounter: Payer: BC Managed Care – PPO | Admitting: Internal Medicine

## 2019-10-18 ENCOUNTER — Ambulatory Visit (INDEPENDENT_AMBULATORY_CARE_PROVIDER_SITE_OTHER): Payer: BC Managed Care – PPO | Admitting: Family Medicine

## 2019-10-18 ENCOUNTER — Encounter: Payer: Self-pay | Admitting: Family Medicine

## 2019-10-18 ENCOUNTER — Other Ambulatory Visit: Payer: Self-pay

## 2019-10-18 VITALS — BP 132/82 | HR 89 | Resp 16 | Ht 64.0 in | Wt 272.0 lb

## 2019-10-18 DIAGNOSIS — Z800 Family history of malignant neoplasm of digestive organs: Secondary | ICD-10-CM | POA: Diagnosis not present

## 2019-10-18 DIAGNOSIS — I10 Essential (primary) hypertension: Secondary | ICD-10-CM

## 2019-10-18 DIAGNOSIS — D473 Essential (hemorrhagic) thrombocythemia: Secondary | ICD-10-CM

## 2019-10-18 DIAGNOSIS — D75839 Thrombocytosis, unspecified: Secondary | ICD-10-CM

## 2019-10-18 DIAGNOSIS — Z0000 Encounter for general adult medical examination without abnormal findings: Secondary | ICD-10-CM | POA: Diagnosis not present

## 2019-10-18 DIAGNOSIS — D539 Nutritional anemia, unspecified: Secondary | ICD-10-CM

## 2019-10-18 DIAGNOSIS — E559 Vitamin D deficiency, unspecified: Secondary | ICD-10-CM

## 2019-10-18 DIAGNOSIS — Z1159 Encounter for screening for other viral diseases: Secondary | ICD-10-CM

## 2019-10-18 DIAGNOSIS — R7301 Impaired fasting glucose: Secondary | ICD-10-CM

## 2019-10-18 MED ORDER — ERGOCALCIFEROL 1.25 MG (50000 UT) PO CAPS: 50000.0000 [IU] | ORAL_CAPSULE | ORAL | 1 refills | Status: DC

## 2019-10-18 MED ORDER — TRIAMTERENE-HCTZ 75-50 MG PO TABS: 1.0000 | ORAL_TABLET | Freq: Every day | ORAL | 5 refills | Status: DC

## 2019-10-18 MED ORDER — PHENTERMINE HCL 37.5 MG PO TABS
37.5000 mg | ORAL_TABLET | Freq: Every day | ORAL | 3 refills | Status: DC
Start: 2019-10-18 — End: 2020-03-04

## 2019-10-18 NOTE — Patient Instructions (Addendum)
F/U in 4 months, call if you need me before  HBA1C in office today  You are referred to GI due to family h/o colon cancer  Congrats on weight loss, aim for 4 pounds/ month  Plan to eat 3 times daily , and snack on veges, fruit and nuts Small quantities)  64 ounces water daly   Vit D , iron and Hep C screen, hBA1C , chem 7 and EGFR, 1 week before visit  Start daily iron 3255 mg  (OTC)

## 2019-10-18 NOTE — Progress Notes (Signed)
Phentermine tab    Rose Mcguire     MRN: [id number]      DOB: 1975-07-18  HPI: Patient is in for annual physical exam. .   PE: BP 132/82   Pulse 89   Resp 16   Ht 5\' 4"  (1.626 m)   Wt 272 lb (123.4 kg)   SpO2 98%   BMI 46.69 kg/m  Pleasant  female, alert and oriented x 3, in no cardio-pulmonary distress. Afebrile. HEENT No facial trauma or asymetry. Sinuses non tender.  Extra occullar muscles intact.. External ears normal, . Neck: supple,  Chest: Clear to ascultation bilaterally.No crackles or wheezes. Non tender to palpation    Cardiovascular system; Heart sounds normal,  S1 and  S2 ,no S3.  No murmur, or thrill. Apical beat not displaced Peripheral pulses normal.  Abdomen: Soft, non tender, no organomegaly or masses.  GU: Asymptomatic, no exam done.   Musculoskeletal exam: Full ROM of spine, hips , shoulders and knees. No deformity ,swelling or crepitus noted. No muscle wasting or atrophy.   Neurologic: Cranial nerves 2 to 12 intact. Power, tone ,sensation and reflexes normal throughout. No disturbance in gait. No tremor.  Skin: Intact, no ulceration, erythema , scaling or rash noted. Pigmentation normal throughout  Psych; Normal mood and affect. Judgement and concentration normal   Assessment & Plan:  FH: colon cancer in relative <84 years old Refer GI for evaluation  Annual physical exam Annual exam as documented. Counseling done  re healthy lifestyle involving commitment to 150 minutes exercise per week, heart healthy diet, and attaining healthy weight.The importance of adequate sleep also discussed. Regular seat belt use and home safety, is also discussed. Changes in health habits are decided on by the patient with goals and time frames  set for achieving them. Immunization and cancer screening needs are specifically addressed at this visit.   Morbid obesity  Patient re-educated about  the importance of commitment to a  minimum  of 150 minutes of exercise per week as able.  The importance of healthy food choices with portion control discussed, as well as eating regularly and within a 12 hour window most days. The need to choose "clean , green" food 50 to 75% of the time is discussed, as well as to make water the primary drink and set a goal of 64 ounces water daily.    Weight /BMI 10/18/2019 06/26/2019 05/03/2019  WEIGHT 272 lb 279 lb 1.9 oz 289 lb  HEIGHT 5\' 4"  5\' 4"  5\' 4"   BMI 46.69 kg/m2 47.91 kg/m2 49.61 kg/m2   Phentermine one daily prescribed   Essential hypertension Controlled, no change in medication DASH diet and commitment to daily physical activity for a minimum of 30 minutes discussed and encouraged, as a part of hypertension management. The importance of attaining a healthy weight is also discussed.  BP/Weight 10/18/2019 06/26/2019 05/03/2019 04/30/2019 04/27/2019 45/0/3888 06/11/32  Systolic BP 917 915 056 979 480 165 537  Diastolic BP 82 86 84 482 90 88 84  Wt. (Lbs) 272 279.12 289 275.58 274 294 274  BMI 46.69 47.91 49.61 47.3 47.03 50.46 47.03

## 2019-10-18 NOTE — Assessment & Plan Note (Signed)
Refer GI for evaluation

## 2019-10-20 ENCOUNTER — Encounter: Payer: Self-pay | Admitting: Family Medicine

## 2019-10-20 NOTE — Assessment & Plan Note (Signed)
Controlled, no change in medication DASH diet and commitment to daily physical activity for a minimum of 30 minutes discussed and encouraged, as a part of hypertension management. The importance of attaining a healthy weight is also discussed.  BP/Weight 10/18/2019 06/26/2019 05/03/2019 04/30/2019 04/27/2019 30/11/4598 06/13/8471  Systolic BP 085 694 370 052 591 028 902  Diastolic BP 82 86 84 284 90 88 84  Wt. (Lbs) 272 279.12 289 275.58 274 294 274  BMI 46.69 47.91 49.61 47.3 47.03 50.46 47.03

## 2019-10-20 NOTE — Assessment & Plan Note (Signed)
  Patient re-educated about  the importance of commitment to a  minimum of 150 minutes of exercise per week as able.  The importance of healthy food choices with portion control discussed, as well as eating regularly and within a 12 hour window most days. The need to choose "clean , green" food 50 to 75% of the time is discussed, as well as to make water the primary drink and set a goal of 64 ounces water daily.    Weight /BMI 10/18/2019 06/26/2019 05/03/2019  WEIGHT 272 lb 279 lb 1.9 oz 289 lb  HEIGHT 5\' 4"  5\' 4"  5\' 4"   BMI 46.69 kg/m2 47.91 kg/m2 49.61 kg/m2   Phentermine one daily prescribed

## 2019-10-20 NOTE — Assessment & Plan Note (Signed)

## 2019-10-25 ENCOUNTER — Encounter (INDEPENDENT_AMBULATORY_CARE_PROVIDER_SITE_OTHER): Payer: Self-pay | Admitting: *Deleted

## 2020-01-01 ENCOUNTER — Telehealth: Payer: Self-pay

## 2020-01-01 NOTE — Telephone Encounter (Signed)
Pt called wanting imm summary sent to [email or web address]. Sent what we had in the system

## 2020-02-19 ENCOUNTER — Encounter: Payer: Self-pay | Admitting: Family Medicine

## 2020-02-19 ENCOUNTER — Other Ambulatory Visit: Payer: Self-pay

## 2020-02-19 ENCOUNTER — Ambulatory Visit (INDEPENDENT_AMBULATORY_CARE_PROVIDER_SITE_OTHER): Payer: BC Managed Care – PPO | Admitting: Family Medicine

## 2020-02-19 VITALS — BP 132/80 | HR 71 | Resp 16 | Ht 64.0 in | Wt 266.0 lb

## 2020-02-19 DIAGNOSIS — D75839 Thrombocytosis, unspecified: Secondary | ICD-10-CM

## 2020-02-19 DIAGNOSIS — E559 Vitamin D deficiency, unspecified: Secondary | ICD-10-CM | POA: Diagnosis not present

## 2020-02-19 DIAGNOSIS — Z23 Encounter for immunization: Secondary | ICD-10-CM

## 2020-02-19 DIAGNOSIS — R7303 Prediabetes: Secondary | ICD-10-CM | POA: Diagnosis not present

## 2020-02-19 DIAGNOSIS — I10 Essential (primary) hypertension: Secondary | ICD-10-CM | POA: Diagnosis not present

## 2020-02-19 DIAGNOSIS — D539 Nutritional anemia, unspecified: Secondary | ICD-10-CM

## 2020-02-19 DIAGNOSIS — Z1231 Encounter for screening mammogram for malignant neoplasm of breast: Secondary | ICD-10-CM

## 2020-02-19 LAB — GLUCOSE, POCT (MANUAL RESULT ENTRY): POC Glucose: 5.8 mg/dl — AB (ref 70–99)

## 2020-02-19 MED ORDER — TRIAMTERENE-HCTZ 75-50 MG PO TABS: 1.0000 | ORAL_TABLET | Freq: Every day | ORAL | 5 refills | Status: DC

## 2020-02-19 MED ORDER — PHENTERMINE HCL 37.5 MG PO TABS
37.5000 mg | ORAL_TABLET | Freq: Every day | ORAL | 3 refills | Status: DC
Start: 2020-02-19 — End: 2020-03-12

## 2020-02-19 NOTE — Patient Instructions (Signed)
Annual exam with pap with MD in 4 months, also re evaluate weight at visit, call if you need me sooner  Weight loss goal of 10 pounds  Congrats on 20 pound weight loss and new daily exercise habit. New goal of 64 ounces water daily  Flu vaccine today  GlycoHB in office today, normal is 5.6 or less, diabetes is 6.5 or more  Chem 7, eGFR, CBC, iron, ferritin and vit D today  Please schedule February mammogram at checkout  It is important that you exercise regularly at least 30 minutes 5 times a week. If you develop chest pain, have severe difficulty breathing, or feel very tired, stop exercising immediately and seek medical attention  Think about what you will eat, plan ahead. Choose " clean, green, fresh or frozen" over canned, processed or packaged foods which are more sugary, salty and fatty. 70 to 75% of food eaten should be vegetables and fruit. Three meals at set times with snacks allowed between meals, but they must be fruit or vegetables. Aim to eat over a 12 hour period , example 7 am to 7 pm, and STOP after  your last meal of the day. Drink water,generally about 64 ounces per day, no other drink is as healthy. Fruit juice is best enjoyed in a healthy way, by EATING the fruit. Thanks for choosing [facility], we consider it a privelige to serve you.

## 2020-02-21 DIAGNOSIS — D539 Nutritional anemia, unspecified: Secondary | ICD-10-CM | POA: Diagnosis not present

## 2020-02-21 DIAGNOSIS — I10 Essential (primary) hypertension: Secondary | ICD-10-CM | POA: Diagnosis not present

## 2020-02-21 DIAGNOSIS — E559 Vitamin D deficiency, unspecified: Secondary | ICD-10-CM | POA: Diagnosis not present

## 2020-02-22 LAB — FERRITIN: Ferritin: 47 ng/mL (ref 15–150)

## 2020-02-22 LAB — CBC
Hematocrit: 36.3 % (ref 34.0–46.6)
Hemoglobin: 11.3 g/dL (ref 11.1–15.9)
MCH: 25.5 pg — ABNORMAL LOW (ref 26.6–33.0)
MCHC: 31.1 g/dL — ABNORMAL LOW (ref 31.5–35.7)
MCV: 82 fL (ref 79–97)
Platelets: 588 10*3/uL — ABNORMAL HIGH (ref 150–450)
RBC: 4.44 x10E6/uL (ref 3.77–5.28)
RDW: 13.4 % (ref 11.7–15.4)
WBC: 8.3 10*3/uL (ref 3.4–10.8)

## 2020-02-22 LAB — IRON: Iron: 30 ug/dL (ref 27–159)

## 2020-02-22 LAB — BMP8+EGFR
BUN/Creatinine Ratio: 14 (ref 9–23)
BUN: 12 mg/dL (ref 6–24)
CO2: 25 mmol/L (ref 20–29)
Calcium: 10.4 mg/dL — ABNORMAL HIGH (ref 8.7–10.2)
Chloride: 93 mmol/L — ABNORMAL LOW (ref 96–106)
Creatinine, Ser: 0.85 mg/dL (ref 0.57–1.00)
GFR calc Af Amer: 98 mL/min/{1.73_m2} (ref >59)
GFR calc non Af Amer: 85 mL/min/{1.73_m2} (ref >59)
Glucose: 87 mg/dL (ref 65–99)
Potassium: 3.9 mmol/L (ref 3.5–5.2)
Sodium: 134 mmol/L (ref 134–144)

## 2020-02-22 LAB — VITAMIN D 25 HYDROXY (VIT D DEFICIENCY, FRACTURES): Vit D, 25-Hydroxy: 23.9 ng/mL — ABNORMAL LOW (ref 30.0–100.0)

## 2020-02-24 ENCOUNTER — Encounter: Payer: Self-pay | Admitting: Family Medicine

## 2020-02-24 NOTE — Assessment & Plan Note (Signed)
Controlled, no change in medication DASH diet and commitment to daily physical activity for a minimum of 30 minutes discussed and encouraged, as a part of hypertension management. The importance of attaining a healthy weight is also discussed.  BP/Weight 02/19/2020 10/18/2019 06/26/2019 05/03/2019 04/30/2019 04/27/2019 65/12/61  Systolic BP 494 944 739 584 417 127 871  Diastolic BP 80 82 86 84 836 90 88  Wt. (Lbs) 266 272 279.12 289 275.58 274 294  BMI 45.66 46.69 47.91 49.61 47.3 47.03 50.46

## 2020-02-24 NOTE — Assessment & Plan Note (Signed)
platelet count gradually trending upward, refer hematology

## 2020-02-24 NOTE — Assessment & Plan Note (Signed)
  Patient re-educated about  the importance of commitment to a  minimum of 150 minutes of exercise per week as able.  The importance of healthy food choices with portion control discussed, as well as eating regularly and within a 12 hour window most days. The need to choose "clean , green" food 50 to 75% of the time is discussed, as well as to make water the primary drink and set a goal of 64 ounces water daily.    Weight /BMI 02/19/2020 10/18/2019 06/26/2019  WEIGHT 266 lb 272 lb 279 lb 1.9 oz  HEIGHT 5\' 4"  5\' 4"  5\' 4"   BMI 45.66 kg/m2 46.69 kg/m2 47.91 kg/m2

## 2020-02-24 NOTE — Assessment & Plan Note (Addendum)
Patient educated about the importance of limiting  Carbohydrate intake , the need to commit to daily physical activity for a minimum of 30 minutes , and to commit weight loss. The fact that changes in all these areas will reduce or eliminate all together the development of diabetes is stressed.  GlycoHB is 5.8 which is improved  Diabetic Labs Latest Ref Rng & Units 02/21/2020 06/26/2019 11/18/2017 06/04/2016 01/29/2015  HbA1c <5.7 % of total Hgb - 6.2(H) - 5.8(H) 6.0(H)  Chol <200 mg/dL - 173 - 157 -  HDL > OR = 50 mg/dL - 62 - 64 -  Calc LDL mg/dL (calc) - 96 - 83 -  Triglycerides <150 mg/dL - 61 - 49 -  Creatinine 0.57 - 1.00 mg/dL 0.85 1.09 0.76 0.76 0.73   BP/Weight 02/19/2020 10/18/2019 06/26/2019 05/03/2019 04/30/2019 04/27/2019 59/08/7074  Systolic BP 151 834 373 578 978 478 412  Diastolic BP 80 82 86 84 820 90 88  Wt. (Lbs) 266 272 279.12 289 275.58 274 294  BMI 45.66 46.69 47.91 49.61 47.3 47.03 50.46   No flowsheet data found.

## 2020-02-24 NOTE — Progress Notes (Signed)
Rose Mcguire     MRN: [id number]      DOB: 1975-07-20   HPI [name] is here for follow up and re-evaluation of chronic medical conditions, medication management and review of any available recent lab and radiology data.  Preventive health is updated, specifically  Cancer screening and Immunization.   Questions or concerns regarding consultations or procedures which the PT has had in the interim are  addressed. The PT denies any adverse reactions to current medications since the last visit.  There are no new concerns.  There are no specific complaints   ROS Denies recent fever or chills. Denies sinus pressure, nasal congestion, ear pain or sore throat. Denies chest congestion, productive cough or wheezing. Denies chest pains, palpitations and leg swelling Denies abdominal pain, nausea, vomiting,diarrhea or constipation.   Denies dysuria, frequency, hesitancy or incontinence. Denies joint pain, swelling and limitation in mobility. Denies headaches, seizures, numbness, or tingling. Denies depression, anxiety or insomnia. Denies skin break down or rash.   PE  BP 132/80   Pulse 71   Resp 16   Ht 5\' 4"  (1.626 m)   Wt 266 lb (120.7 kg)   SpO2 100%   BMI 45.66 kg/m   Patient alert and oriented and in no cardiopulmonary distress.  HEENT: No facial asymmetry, EOMI,     Neck supple .  Chest: Clear to auscultation bilaterally.  CVS: S1, S2 no murmurs, no S3.Regular rate.  ABD: Soft non tender.   Ext: No edema  MS: Adequate ROM spine, shoulders, hips and knees.  Skin: Intact, no ulcerations or rash noted.  Psych: Good eye contact, normal affect. Memory intact not anxious or depressed appearing.  CNS: CN 2-12 intact, power,  normal throughout.no focal deficits noted.   Assessment & Plan  Essential hypertension Controlled, no change in medication DASH diet and commitment to daily physical activity for a minimum of 30 minutes discussed and encouraged, as a part  of hypertension management. The importance of attaining a healthy weight is also discussed.  BP/Weight 02/19/2020 10/18/2019 06/26/2019 05/03/2019 04/30/2019 04/27/2019 81/05/5724  Systolic BP 203 559 741 638 453 646 803  Diastolic BP 80 82 86 84 212 90 88  Wt. (Lbs) 266 272 279.12 289 275.58 274 294  BMI 45.66 46.69 47.91 49.61 47.3 47.03 50.46       Morbid obesity  Patient re-educated about  the importance of commitment to a  minimum of 150 minutes of exercise per week as able.  The importance of healthy food choices with portion control discussed, as well as eating regularly and within a 12 hour window most days. The need to choose "clean , green" food 50 to 75% of the time is discussed, as well as to make water the primary drink and set a goal of 64 ounces water daily.    Weight /BMI 02/19/2020 10/18/2019 06/26/2019  WEIGHT 266 lb 272 lb 279 lb 1.9 oz  HEIGHT 5\' 4"  5\' 4"  5\' 4"   BMI 45.66 kg/m2 46.69 kg/m2 47.91 kg/m2      Prediabetes Patient educated about the importance of limiting  Carbohydrate intake , the need to commit to daily physical activity for a minimum of 30 minutes , and to commit weight loss. The fact that changes in all these areas will reduce or eliminate all together the development of diabetes is stressed.  GlycoHB is 5.8 which is improved  Diabetic Labs Latest Ref Rng & Units 02/21/2020 06/26/2019 11/18/2017 06/04/2016 01/29/2015  HbA1c <5.7 %  of total Hgb - 6.2(H) - 5.8(H) 6.0(H)  Chol <200 mg/dL - 173 - 157 -  HDL > OR = 50 mg/dL - 62 - 64 -  Calc LDL mg/dL (calc) - 96 - 83 -  Triglycerides <150 mg/dL - 61 - 49 -  Creatinine 0.57 - 1.00 mg/dL 0.85 1.09 0.76 0.76 0.73   BP/Weight 02/19/2020 10/18/2019 06/26/2019 05/03/2019 04/30/2019 04/27/2019 31/06/5085  Systolic BP 199 412 904 753 391 792 178  Diastolic BP 80 82 86 84 375 90 88  Wt. (Lbs) 266 272 279.12 289 275.58 274 294  BMI 45.66 46.69 47.91 49.61 47.3 47.03 50.46   No flowsheet data  found.    Vitamin D deficiency Needs supplemental vit D  Thrombocytosis platelet count gradually trending upward, refer hematology

## 2020-02-24 NOTE — Assessment & Plan Note (Signed)
Needs supplemental vit D

## 2020-03-04 ENCOUNTER — Ambulatory Visit (INDEPENDENT_AMBULATORY_CARE_PROVIDER_SITE_OTHER): Payer: BC Managed Care – PPO | Admitting: Family Medicine

## 2020-03-04 ENCOUNTER — Encounter: Payer: Self-pay | Admitting: Family Medicine

## 2020-03-04 ENCOUNTER — Other Ambulatory Visit: Payer: Self-pay

## 2020-03-04 VITALS — BP 122/85 | HR 105 | Resp 16 | Ht 64.0 in | Wt 264.1 lb

## 2020-03-04 DIAGNOSIS — N921 Excessive and frequent menstruation with irregular cycle: Secondary | ICD-10-CM

## 2020-03-04 DIAGNOSIS — I10 Essential (primary) hypertension: Secondary | ICD-10-CM

## 2020-03-04 DIAGNOSIS — D509 Iron deficiency anemia, unspecified: Secondary | ICD-10-CM | POA: Diagnosis not present

## 2020-03-04 NOTE — Progress Notes (Signed)
   Rose Mcguire     MRN: [id number]      DOB: Jan 09, 1976   HPI Rose Mcguire is here with concern of irregular bleeding this month, generally has a 7 daty cycle, with clotting and flooding for  At least 2 days, This October she bled for the first 7 days , and again started spotting 10/25 with new bleeding todayc Also has IDA ROS See HPI  Denies recent fever or chills. Denies sinus pressure, nasal congestion, ear pain or sore throat. Denies chest congestion, productive cough or wheezing. Denies chest pains, palpitations and leg swelling Denies abdominal pain, nausea, vomiting,diarrhea or constipation.   Denies dysuria, frequency, hesitancy or incontinence. Denies joint pain, swelling and limitation in mobility. Denies headaches, seizures, numbness, or tingling. Denies depression, anxiety or insomnia. Denies skin break down or rash.   PE  BP 122/85   Pulse (!) 105   Resp 16   Ht 5\' 4"  (1.626 m)   Wt 264 lb 1.3 oz (119.8 kg)   SpO2 99%   BMI 45.33 kg/m   Patient alert and oriented and in no cardiopulmonary distress.  HEENT: No facial asymmetry, EOMI,     Neck supple .  Chest: Clear to auscultation bilaterally.  CVS: S1, S2 no murmurs, no S3.Regular rate.  ABD: Soft non tender.  Pelvic not examined  Ext: No edema  MS: Adequate ROM spine, shoulders, hips and knees.  Skin: Intact, no ulcerations or rash noted.  Psych: Good eye contact, normal affect. Memory intact not anxious or depressed appearing.  CNS: CN 2-12 intact, power,  normal throughout.no focal deficits noted.   Assessment & Plan  Menorrhagia with irregular cycle New change in cycle with h/o flooding , clotting and iDA, refer Gyne  Morbid obesity  Patient re-educated about  the importance of commitment to a  minimum of 150 minutes of exercise per week as able.  The importance of healthy food choices with portion control discussed, as well as eating regularly and within a 12 hour window most  days. The need to choose "clean , green" food 50 to 75% of the time is discussed, as well as to make water the primary drink and set a goal of 64 ounces water daily.    Weight /BMI 03/04/2020 02/19/2020 10/18/2019  WEIGHT 264 lb 1.3 oz 266 lb 272 lb  HEIGHT 5\' 4"  5\' 4"  5\' 4"   BMI 45.33 kg/m2 45.66 kg/m2 46.69 kg/m2      Essential hypertension Controlled, no change in medication DASH diet and commitment to daily physical activity for a minimum of 30 minutes discussed and encouraged, as a part of hypertension management. The importance of attaining a healthy weight is also discussed.  BP/Weight 03/04/2020 02/19/2020 10/18/2019 06/26/2019 05/03/2019 04/30/2019 44/05/270  Systolic BP 536 644 034 742 595 638 756  Diastolic BP 85 80 82 86 84 106 90  Wt. (Lbs) 264.08 266 272 279.12 289 275.58 274  BMI 45.33 45.66 46.69 47.91 49.61 47.3 47.03

## 2020-03-04 NOTE — Patient Instructions (Addendum)
F/U as before, call if you need me sooner  You are referred to Gyne re menstrual bleeding  Fasting lipid, cmp and eGFr, hepatitis c screen, cBC, iron and ferritin, tSH and hBA1C 1 week before your February appoinment Continue iron supplement and add stool softener(s) daily as needed  Ensure you drink 64 ounces water daily and commit to regular exercise  Think about what you will eat, plan ahead. Choose " clean, green, fresh or frozen" over canned, processed or packaged foods which are more sugary, salty and fatty. 70 to 75% of food eaten should be vegetables and fruit. Three meals at set times with snacks allowed between meals, but they must be fruit or vegetables. Aim to eat over a 12 hour period , example 7 am to 7 pm, and STOP after  your last meal of the day. Drink water,generally about 64 ounces per day, no other drink is as healthy. Fruit juice is best enjoyed in a healthy way, by EATING the fruit. Thanks for choosing [facility], we consider it a privelige to serve you.

## 2020-03-05 ENCOUNTER — Encounter: Payer: Self-pay | Admitting: Family Medicine

## 2020-03-05 DIAGNOSIS — N921 Excessive and frequent menstruation with irregular cycle: Secondary | ICD-10-CM | POA: Insufficient documentation

## 2020-03-05 NOTE — Assessment & Plan Note (Signed)
New change in cycle with h/o flooding , clotting and iDA, refer Gyne

## 2020-03-05 NOTE — Assessment & Plan Note (Signed)
  Patient re-educated about  the importance of commitment to a  minimum of 150 minutes of exercise per week as able.  The importance of healthy food choices with portion control discussed, as well as eating regularly and within a 12 hour window most days. The need to choose "clean , green" food 50 to 75% of the time is discussed, as well as to make water the primary drink and set a goal of 64 ounces water daily.    Weight /BMI 03/04/2020 02/19/2020 10/18/2019  WEIGHT 264 lb 1.3 oz 266 lb 272 lb  HEIGHT 5\' 4"  5\' 4"  5\' 4"   BMI 45.33 kg/m2 45.66 kg/m2 46.69 kg/m2

## 2020-03-05 NOTE — Assessment & Plan Note (Signed)
Controlled, no change in medication DASH diet and commitment to daily physical activity for a minimum of 30 minutes discussed and encouraged, as a part of hypertension management. The importance of attaining a healthy weight is also discussed.  BP/Weight 03/04/2020 02/19/2020 10/18/2019 06/26/2019 05/03/2019 04/30/2019 71/25/2479  Systolic BP 980 012 393 594 090 502 561  Diastolic BP 85 80 82 86 84 106 90  Wt. (Lbs) 264.08 266 272 279.12 289 275.58 274  BMI 45.33 45.66 46.69 47.91 49.61 47.3 47.03

## 2020-03-06 ENCOUNTER — Encounter (INDEPENDENT_AMBULATORY_CARE_PROVIDER_SITE_OTHER): Payer: Self-pay | Admitting: *Deleted

## 2020-03-12 ENCOUNTER — Ambulatory Visit (INDEPENDENT_AMBULATORY_CARE_PROVIDER_SITE_OTHER): Payer: BC Managed Care – PPO | Admitting: Obstetrics & Gynecology

## 2020-03-12 ENCOUNTER — Encounter: Payer: Self-pay | Admitting: Obstetrics & Gynecology

## 2020-03-12 VITALS — BP 135/94 | HR 102 | Ht 64.0 in | Wt 264.5 lb

## 2020-03-12 DIAGNOSIS — N920 Excessive and frequent menstruation with regular cycle: Secondary | ICD-10-CM

## 2020-03-12 MED ORDER — NORETHINDRONE 0.35 MG PO TABS: 1.0000 | ORAL_TABLET | Freq: Every day | ORAL | 11 refills | Status: DC

## 2020-03-12 NOTE — Progress Notes (Signed)
Chief Complaint  Patient presents with  . Menorrhagia      44 y.o. [id number] Patient's last menstrual period was 03/05/2020 (approximate). The current method of family planning is condoms  Outpatient Encounter Medications as of 03/12/2020  Medication Sig  . docusate sodium (COLACE) 100 MG capsule Take 100 mg by mouth daily as needed for mild constipation.  . ergocalciferol (VITAMIN D2) 1.25 MG (50000 UT) capsule Take 1 capsule (50,000 Units total) by mouth once a week. One capsule once weekly  . ferrous sulfate 325 (65 FE) MG tablet Take 1 tablet (325 mg total) by mouth daily with breakfast.  . triamterene-hydrochlorothiazide (MAXZIDE) 75-50 MG tablet Take 1 tablet by mouth daily.  . norethindrone (MICRONOR) 0.35 MG tablet Take 1 tablet (0.35 mg total) by mouth daily. Take 1 a day  . [DISCONTINUED] phentermine (ADIPEX-P) 37.5 MG tablet Take 1 tablet (37.5 mg total) by mouth daily before breakfast. (Patient not taking: Reported on 03/04/2020)   No facility-administered encounter medications on file as of 03/12/2020.    Subjective Pt has regular periods with some spotting for about a week before Heavy for 2 days No pain Rarely soils No other complaints Hemoglobin 11.3 2 weeks ago Past Medical History:  Diagnosis Date  . Allergy   . Hypertension   . Hypertensive retinopathy    OU  . Obesity     History reviewed. No pertinent surgical history.  OB History    Gravida  2   Para  2   Term  2   Preterm  0   AB  0   Living  2     SAB  0   TAB  0   Ectopic  0   Multiple  0   Live Births  2           No Known Allergies  Social History   Socioeconomic History  . Marital status: Soil scientist    Spouse name: Not on file  . Number of children: 2  . Years of education: Not on file  . Highest education level: Not on file  Occupational History  . Occupation: CNA   Tobacco Use  . Smoking status: Never Smoker  . Smokeless tobacco: Never Used    Vaping Use  . Vaping Use: Never used  Substance and Sexual Activity  . Alcohol use: No  . Drug use: No  . Sexual activity: Yes    Birth control/protection: None  Other Topics Concern  . Not on file  Social History Narrative  . Not on file   Social Determinants of Health   Financial Resource Strain: Low Risk   . Difficulty of Paying Living Expenses: Not hard at all  Food Insecurity: No Food Insecurity  . Worried About Charity fundraiser in the Last Year: Never true  . Ran Out of Food in the Last Year: Never true  Transportation Needs: No Transportation Needs  . Lack of Transportation (Medical): No  . Lack of Transportation (Non-Medical): No  Physical Activity: Insufficiently Active  . Days of Exercise per Week: 4 days  . Minutes of Exercise per Session: 30 min  Stress: No Stress Concern Present  . Feeling of Stress : Not at all  Social Connections: Moderately Integrated  . Frequency of Communication with Friends and Family: More than three times a week  . Frequency of Social Gatherings with Friends and Family: More than three times a week  . Attends Religious Services: More than  4 times per year  . Active Member of Clubs or Organizations: No  . Attends Archivist Meetings: Never  . Marital Status: Living with partner    Family History  Problem Relation Age of Onset  . Hypertension Mother   . Diabetes Father   . Hypertension Father     Medications:       Current Outpatient Medications:  .  docusate sodium (COLACE) 100 MG capsule, Take 100 mg by mouth daily as needed for mild constipation., Disp: , Rfl:  .  ergocalciferol (VITAMIN D2) 1.25 MG (50000 UT) capsule, Take 1 capsule (50,000 Units total) by mouth once a week. One capsule once weekly, Disp: 12 capsule, Rfl: 1 .  ferrous sulfate 325 (65 FE) MG tablet, Take 1 tablet (325 mg total) by mouth daily with breakfast., Disp: 90 tablet, Rfl: 1 .  triamterene-hydrochlorothiazide (MAXZIDE) 75-50 MG tablet, Take  1 tablet by mouth daily., Disp: 30 tablet, Rfl: 5 .  norethindrone (MICRONOR) 0.35 MG tablet, Take 1 tablet (0.35 mg total) by mouth daily. Take 1 a day, Disp: 28 tablet, Rfl: 11  Objective Blood pressure (!) 135/94, pulse (!) 102, height 5\' 4"  (1.626 m), weight 264 lb 8 oz (120 kg), last menstrual period 03/05/2020.  General WDWN female NAD Vulva:  normal appearing vulva with no masses, tenderness or lesions Vagina:  normal mucosa, no discharge Cervix:  Normal no lesions Uterus:  normal size, contour, position, consistency, mobility, non-tender Adnexa: ovaries:present,  normal adnexa in size, nontender and no masses   Pertinent ROS No burning with urination, frequency or urgency No nausea, vomiting or diarrhea Nor fever chills or other constitutional symptoms   Labs or studies Reviewed her labs    Impression Diagnoses this Encounter::   ICD-10-CM   1. Menorrhagia with regular cycle  N92.0     Established relevant diagnosis(es):   Plan/Recommendations: Meds ordered this encounter  Medications  . norethindrone (MICRONOR) 0.35 MG tablet    Sig: Take 1 tablet (0.35 mg total) by mouth daily. Take 1 a day    Dispense:  28 tablet    Refill:  11    Labs or Scans Ordered: No orders of the defined types were placed in this encounter.   Management:: Trial a course of progesterone only OCP and see if her menses are lighter better controlled  Follow up Return in about 4 months (around 07/10/2020) for [place] visit, with Dr Elonda Husky.       All questions were answered.

## 2020-03-18 ENCOUNTER — Other Ambulatory Visit: Payer: Self-pay

## 2020-03-18 ENCOUNTER — Encounter (HOSPITAL_COMMUNITY): Payer: Self-pay | Admitting: Hematology

## 2020-03-18 ENCOUNTER — Inpatient Hospital Stay (HOSPITAL_COMMUNITY): Payer: BC Managed Care – PPO

## 2020-03-18 ENCOUNTER — Inpatient Hospital Stay (HOSPITAL_COMMUNITY): Payer: BC Managed Care – PPO | Attending: Hematology | Admitting: Hematology

## 2020-03-18 VITALS — BP 146/96 | HR 125 | Temp 97.1°F | Resp 18 | Ht 64.0 in | Wt 256.8 lb

## 2020-03-18 DIAGNOSIS — D75839 Thrombocytosis, unspecified: Secondary | ICD-10-CM | POA: Diagnosis not present

## 2020-03-18 DIAGNOSIS — Z833 Family history of diabetes mellitus: Secondary | ICD-10-CM | POA: Diagnosis not present

## 2020-03-18 DIAGNOSIS — Z8249 Family history of ischemic heart disease and other diseases of the circulatory system: Secondary | ICD-10-CM

## 2020-03-18 DIAGNOSIS — Z801 Family history of malignant neoplasm of trachea, bronchus and lung: Secondary | ICD-10-CM | POA: Diagnosis not present

## 2020-03-18 DIAGNOSIS — E669 Obesity, unspecified: Secondary | ICD-10-CM | POA: Insufficient documentation

## 2020-03-18 DIAGNOSIS — Z79899 Other long term (current) drug therapy: Secondary | ICD-10-CM | POA: Insufficient documentation

## 2020-03-18 DIAGNOSIS — Z800 Family history of malignant neoplasm of digestive organs: Secondary | ICD-10-CM | POA: Diagnosis not present

## 2020-03-18 LAB — CBC WITH DIFFERENTIAL/PLATELET
Abs Immature Granulocytes: 0.03 10*3/uL (ref 0.00–0.07)
Basophils Absolute: 0 10*3/uL (ref 0.0–0.1)
Basophils Relative: 0 %
Eosinophils Absolute: 0 10*3/uL (ref 0.0–0.5)
Eosinophils Relative: 0 %
HCT: 38.5 % (ref 36.0–46.0)
Hemoglobin: 11.6 g/dL — ABNORMAL LOW (ref 12.0–15.0)
Immature Granulocytes: 0 %
Lymphocytes Relative: 23 %
Lymphs Abs: 1.9 10*3/uL (ref 0.7–4.0)
MCH: 26.1 pg (ref 26.0–34.0)
MCHC: 30.1 g/dL (ref 30.0–36.0)
MCV: 86.7 fL (ref 80.0–100.0)
Monocytes Absolute: 0.4 10*3/uL (ref 0.1–1.0)
Monocytes Relative: 5 %
Neutro Abs: 5.7 10*3/uL (ref 1.7–7.7)
Neutrophils Relative %: 72 %
Platelets: 528 10*3/uL — ABNORMAL HIGH (ref 150–400)
RBC: 4.44 MIL/uL (ref 3.87–5.11)
RDW: 14.2 % (ref 11.5–15.5)
WBC: 8.1 10*3/uL (ref 4.0–10.5)
nRBC: 0 % (ref 0.0–0.2)

## 2020-03-18 LAB — COMPREHENSIVE METABOLIC PANEL
ALT: 9 U/L (ref 0–44)
AST: 11 U/L — ABNORMAL LOW (ref 15–41)
Albumin: 3.9 g/dL (ref 3.5–5.0)
Alkaline Phosphatase: 74 U/L (ref 38–126)
Anion gap: 9 (ref 5–15)
BUN: 10 mg/dL (ref 6–20)
CO2: 30 mmol/L (ref 22–32)
Calcium: 9.8 mg/dL (ref 8.9–10.3)
Chloride: 95 mmol/L — ABNORMAL LOW (ref 98–111)
Creatinine, Ser: 0.93 mg/dL (ref 0.44–1.00)
GFR, Estimated: >60 mL/min (ref >60)
Glucose, Bld: 101 mg/dL — ABNORMAL HIGH (ref 70–99)
Potassium: 3.3 mmol/L — ABNORMAL LOW (ref 3.5–5.1)
Sodium: 134 mmol/L — ABNORMAL LOW (ref 135–145)
Total Bilirubin: 0.4 mg/dL (ref 0.3–1.2)
Total Protein: 8.5 g/dL — ABNORMAL HIGH (ref 6.5–8.1)

## 2020-03-18 LAB — FOLATE: Folate: 8.9 ng/mL (ref >5.9)

## 2020-03-18 LAB — RETICULOCYTES
Immature Retic Fract: 16.9 % — ABNORMAL HIGH (ref 2.3–15.9)
RBC.: 4.49 MIL/uL (ref 3.87–5.11)
Retic Count, Absolute: 87.6 10*3/uL (ref 19.0–186.0)
Retic Ct Pct: 2 % (ref 0.4–3.1)

## 2020-03-18 LAB — IRON AND TIBC
Iron: 52 ug/dL (ref 28–170)
Saturation Ratios: 13 % (ref 10.4–31.8)
TIBC: 387 ug/dL (ref 250–450)
UIBC: 335 ug/dL

## 2020-03-18 LAB — SEDIMENTATION RATE: Sed Rate: 74 mm/hr — ABNORMAL HIGH (ref 0–22)

## 2020-03-18 LAB — LACTATE DEHYDROGENASE: LDH: 134 U/L (ref 98–192)

## 2020-03-18 LAB — C-REACTIVE PROTEIN: CRP: 3.4 mg/dL — ABNORMAL HIGH (ref <1.0)

## 2020-03-18 LAB — FERRITIN: Ferritin: 26 ng/mL (ref 11–307)

## 2020-03-18 NOTE — Patient Instructions (Signed)
[place] at [facility] Discharge Instructions  You were seen and examined today by Dr. Delton Coombes. Dr. Delton Coombes is a hematologist, meaning he specializes in blood disorders. Dr. Delton Coombes discussed your past medical history, family history of cancer and the events that led to you being here today. Your platelet count is elevated.  Dr. Delton Coombes has recommended additional lab work to assess for essential thrombocytosis. This occurs when your body creates too many platelets with no cause. Dr. Delton Coombes will also rule out lupus and rheumatoid arthritis.  You will return to the clinic for follow-up in 2 weeks.   Thank you for choosing [place] at [facility] to provide your oncology and hematology care.  To afford each patient quality time with our provider, please arrive at least 15 minutes before your scheduled appointment time.   If you have a lab appointment with the [facility] please come in thru the Main Entrance and check in at the main information desk.  You need to re-schedule your appointment should you arrive 10 or more minutes late.  We strive to give you quality time with our providers, and arriving late affects you and other patients whose appointments are after yours.  Also, if you no show three or more times for appointments you may be dismissed from the clinic at the providers discretion.     Again, thank you for choosing [facility].  Our hope is that these requests will decrease the amount of time that you wait before being seen by our physicians.       _____________________________________________________________  Should you have questions after your visit to [facility], please contact our office at [phone] and follow the prompts.  Our office hours are 8:00 a.m. and 4:30 p.m. Monday - Friday.  Please note that voicemails left after 4:00 p.m. may not be returned until the following business  day.  We are closed weekends and major holidays.  You do have access to a nurse 24-7, just call the main number to the clinic [phone] and do not press any options, hold on the line and a nurse will answer the phone.    For prescription refill requests, have your pharmacy contact our office and allow 72 hours.    Due to Covid, you will need to wear a mask upon entering the hospital. If you do not have a mask, a mask will be given to you at the Main Entrance upon arrival. For doctor visits, patients may have 1 support person age 65 or older with them. For treatment visits, patients can not have anyone with them due to social distancing guidelines and our immunocompromised population.

## 2020-03-18 NOTE — Progress Notes (Signed)
Rose Mcguire, [place] [address]   CLINIC:  Medical Oncology/Hematology  Patient Care Team: Fayrene Helper, MD as PCP - General  CHIEF COMPLAINTS/PURPOSE OF CONSULTATION:  Evaluation of thrombocytosis  HISTORY OF PRESENTING ILLNESS:  Rose Mcguire 44 y.o. female is here because of thrombocytosis, at the request of Dr. Tula Nakayama. She has had elevated platelet count since at least 2008.  Today she reports feeling okay. She reports that she just started taking iron tablets by Dr. Moshe Cipro after 10/18; she is also taking Colace. She reports having occasional clots during her menstrual cycles, but denies DVT's or PE's. She was just started on Micronor by Dr. Elonda Husky to begin during her next cycle. She has lost about 25 lbs since starting phentermine since the beginning of 2021. She denies changes in skin color of her fingers, itching after hot shower, headaches, leg swelling, rashes, vision changes or blurry vision. She denies ever being diagnosed with lupus or RA. She has never had a platelet or blood transfusion. She is not currently taking ASA 81, antibiotics or steroids and denies having recurrent infections.  She is scheduled for a colonoscopy in January. 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 She is a non-smoker. Her maternal aunt had pancreatic cancer; maternal uncle had throat cancer; maternal cousin had colon cancer. She denies presence of hemochromatosis or thrombocytosis in her family.   MEDICAL HISTORY:  Past Medical History:  Diagnosis Date  . Allergy   . Hypertension   . Hypertensive retinopathy    OU  . Obesity     SURGICAL HISTORY: No past surgical history on file.  SOCIAL HISTORY: Social History   Socioeconomic History  . Marital status: Soil scientist    Spouse name: Not on file  . Number of children: 2  . Years of education: Not on file  . Highest education level: Not on file  Occupational  History  . Occupation: CNA   Tobacco Use  . Smoking status: Never Smoker  . Smokeless tobacco: Never Used  Vaping Use  . Vaping Use: Never used  Substance and Sexual Activity  . Alcohol use: No  . Drug use: No  . Sexual activity: Yes    Birth control/protection: None  Other Topics Concern  . Not on file  Social History Narrative  . Not on file   Social Determinants of Health   Financial Resource Strain: Low Risk   . Difficulty of Paying Living Expenses: Not hard at all  Food Insecurity: No Food Insecurity  . Worried About Charity fundraiser in the Last Year: Never true  . Ran Out of Food in the Last Year: Never true  Transportation Needs: No Transportation Needs  . Lack of Transportation (Medical): No  . Lack of Transportation (Non-Medical): No  Physical Activity: Insufficiently Active  . Days of Exercise per Week: 4 days  . Minutes of Exercise per Session: 30 min  Stress: No Stress Concern Present  . Feeling of Stress : Not at all  Social Connections: Moderately Integrated  . Frequency of Communication with Friends and Family: More than three times a week  . Frequency of Social Gatherings with Friends and Family: More than three times a week  . Attends Religious Services: More than 4 times per year  . Active Member of Clubs or Organizations: No  . Attends Archivist Meetings: Never  . Marital Status: Living with partner  Intimate Partner Violence:  Not At Risk  . Fear of Current or Ex-Partner: No  . Emotionally Abused: No  . Physically Abused: No  . Sexually Abused: No    FAMILY HISTORY: Family History  Problem Relation Age of Onset  . Hypertension Mother   . Diabetes Father   . Hypertension Father     ALLERGIES:  has No Known Allergies.  MEDICATIONS:  Current Outpatient Medications  Medication Sig Dispense Refill  . docusate sodium (COLACE) 100 MG capsule Take 100 mg by mouth daily as needed for mild constipation.    . ergocalciferol (VITAMIN  D2) 1.25 MG (50000 UT) capsule Take 1 capsule (50,000 Units total) by mouth once a week. One capsule once weekly 12 capsule 1  . ferrous sulfate 325 (65 FE) MG tablet Take 1 tablet (325 mg total) by mouth daily with breakfast. 90 tablet 1  . norethindrone (MICRONOR) 0.35 MG tablet Take 1 tablet (0.35 mg total) by mouth daily. Take 1 a day 28 tablet 11  . triamterene-hydrochlorothiazide (MAXZIDE) 75-50 MG tablet Take 1 tablet by mouth daily. 30 tablet 5   No current facility-administered medications for this visit.    REVIEW OF SYSTEMS:   Review of Systems  Constitutional: Negative for appetite change, fatigue and unexpected weight change.  Eyes: Negative for eye problems.  Cardiovascular: Negative for leg swelling.  Gastrointestinal: Positive for constipation.  Skin: Negative for itching and rash.  Neurological: Negative for headaches.  All other systems reviewed and are negative.    PHYSICAL EXAMINATION: ECOG PERFORMANCE STATUS: 0 - Asymptomatic  Vitals:   03/18/20 0817  Resp: 18  Temp: (!) 97.1 F (36.2 C)  SpO2: 100%   Filed Weights   03/18/20 0817  Weight: 256 lb 12.8 oz (116.5 kg)   Physical Exam Vitals reviewed.  Constitutional:      Appearance: Normal appearance. She is obese.  Cardiovascular:     Rate and Rhythm: Normal rate and regular rhythm.     Pulses: Normal pulses.     Heart sounds: Normal heart sounds.  Pulmonary:     Effort: Pulmonary effort is normal.     Breath sounds: Normal breath sounds.  Abdominal:     Palpations: Abdomen is soft. There is no hepatomegaly, splenomegaly or mass.     Tenderness: There is no abdominal tenderness.     Hernia: No hernia is present.  Musculoskeletal:     Right lower leg: No edema.     Left lower leg: No edema.  Lymphadenopathy:     Cervical: No cervical adenopathy.     Upper Body:     Right upper body: No supraclavicular, axillary or pectoral adenopathy.     Left upper body: No supraclavicular, axillary or  pectoral adenopathy.  Neurological:     General: No focal deficit present.     Mental Status: She is alert and oriented to person, place, and time.  Psychiatric:        Mood and Affect: Mood normal.        Behavior: Behavior normal.      LABORATORY DATA:  I have reviewed the data as listed Recent Results (from the past 2160 hour(s))  POCT glucose (manual entry)     Status: Abnormal   Collection Time: 02/19/20  9:38 AM  Result Value Ref Range   POC Glucose 5.8 (A) 70 - 99 mg/dl  BMP8+eGFR     Status: Abnormal   Collection Time: 02/21/20  1:29 PM  Result Value Ref Range   Glucose 87   65 - 99 mg/dL   BUN 12 6 - 24 mg/dL   Creatinine, Ser 0.85 0.57 - 1.00 mg/dL   GFR calc non Af Amer 85 >59 mL/min/1.73   GFR calc Af Amer 98 >59 mL/min/1.73    Comment: **In accordance with recommendations from the NKF-ASN Task force,**   [facility] is in the process of updating its eGFR calculation to the   2021 CKD-EPI creatinine equation that estimates kidney function   without a race variable.    BUN/Creatinine Ratio 14 9 - 23   Sodium 134 134 - 144 mmol/L   Potassium 3.9 3.5 - 5.2 mmol/L   Chloride 93 (L) 96 - 106 mmol/L   CO2 25 20 - 29 mmol/L   Calcium 10.4 (H) 8.7 - 10.2 mg/dL  CBC     Status: Abnormal   Collection Time: 02/21/20  1:29 PM  Result Value Ref Range   WBC 8.3 3.4 - 10.8 x10E3/uL   RBC 4.44 3.77 - 5.28 x10E6/uL   Hemoglobin 11.3 11.1 - 15.9 g/dL   Hematocrit 36.3 34.0 - 46.6 %   MCV 82 79 - 97 fL   MCH 25.5 (L) 26.6 - 33.0 pg   MCHC 31.1 (L) 31 - 35 g/dL   RDW 13.4 11.7 - 15.4 %   Platelets 588 (H) 150 - 450 x10E3/uL  Iron     Status: None   Collection Time: 02/21/20  1:29 PM  Result Value Ref Range   Iron 30 27 - 159 ug/dL  Ferritin     Status: None   Collection Time: 02/21/20  1:29 PM  Result Value Ref Range   Ferritin 47 15.0 - 150.0 ng/mL  VITAMIN D 25 Hydroxy (Vit-D Deficiency, Fractures)     Status: Abnormal   Collection Time: 02/21/20  1:29 PM  Result Value  Ref Range   Vit D, 25-Hydroxy 23.9 (L) 30.0 - 100.0 ng/mL    Comment: Vitamin D deficiency has been defined by the [place] and an Endocrine Society practice guideline as a level of serum 25-OH vitamin D less than 20 ng/mL (1,2). The Endocrine Society went on to further define vitamin D insufficiency as a level between 21 and 29 ng/mL (2). 1. IOM (Institute of Medicine). 2010. Dietary reference    intakes for calcium and D. [place]: The    [employer]. 2. Holick MF, [place], Bischoff-Ferrari HA, et al.    Evaluation, treatment, and prevention of vitamin D    deficiency: an Endocrine Society clinical practice    guideline. JCEM. 2011 Jul; 96(7):1911-30.     RADIOGRAPHIC STUDIES: I have personally reviewed the radiological images as listed and agreed with the findings in the report. No results found.  ASSESSMENT:  1.  Thrombocytosis: -Patient seen at the request of Dr. Moshe Cipro for isolated thrombocytosis since 2008. -Her ferritin on 02/21/2020 was 47.  Patient was told to start taking iron tablet since then. -She does not have any history of thrombosis.  She is on progesterone only oral contraceptive pill for menorrhagia which was prescribed recently and patient is yet to start.  Never had blood transfusion. -She is on phentermine for few months and has lost 24 pounds.  Denies any fevers or night sweats. -No history of connective tissue disorders. -No vasomotor symptoms, erythromelalgia's, or aquagenic pruritus.  2.  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  She is a non-smoker. -Maternal aunt had pancreatic cancer.  Maternal  uncle had throat cancer and a maternal cousin has colon cancer.    PLAN:  1.  Thrombocytosis: -We have reviewed various etiologies of isolated thrombocytosis. -We will repeat her platelet count today.  We will check ferritin to see if there is any improvement  with 1 month of iron tablet. -We will check for connective tissue disorders with ESR, CRP, ANA, rheumatoid factor, lupus anticoagulant. -We will also evaluate for myeloproliferative disorders by checking JAK2 with reflex mutation testing. -We will see her back in 2 weeks to discuss results.   Sreedhar Katragadda, MD 03/18/20 8:19 AM  [facility] [phone]   I, Daniel Khashchuk, am acting as a scribe for Dr. Sreedhar Katagadda.  I, Sreedhar Katragadda MD, have reviewed the above documentation for accuracy and completeness, and I agree with the above.    

## 2020-03-19 LAB — RHEUMATOID FACTOR: Rheumatoid fact SerPl-aCnc: <10 IU/mL (ref 0.0–13.9)

## 2020-03-19 LAB — ANTINUCLEAR ANTIBODIES, IFA: ANA Ab, IFA: NEGATIVE

## 2020-04-02 DIAGNOSIS — H1045 Other chronic allergic conjunctivitis: Secondary | ICD-10-CM | POA: Diagnosis not present

## 2020-04-02 DIAGNOSIS — H40053 Ocular hypertension, bilateral: Secondary | ICD-10-CM | POA: Diagnosis not present

## 2020-04-02 DIAGNOSIS — H40013 Open angle with borderline findings, low risk, bilateral: Secondary | ICD-10-CM | POA: Diagnosis not present

## 2020-04-02 DIAGNOSIS — H33323 Round hole, bilateral: Secondary | ICD-10-CM | POA: Diagnosis not present

## 2020-04-04 LAB — CALR + JAK2 E12-15 + MPL (REFLEXED)

## 2020-04-04 LAB — JAK2 V617F, W REFLEX TO CALR/E12/MPL

## 2020-04-09 ENCOUNTER — Inpatient Hospital Stay (HOSPITAL_COMMUNITY): Payer: BC Managed Care – PPO | Attending: Oncology | Admitting: Oncology

## 2020-04-09 ENCOUNTER — Other Ambulatory Visit: Payer: Self-pay

## 2020-04-09 VITALS — BP 149/101 | HR 102 | Temp 97.3°F | Resp 18 | Wt 266.5 lb

## 2020-04-09 DIAGNOSIS — D75839 Thrombocytosis, unspecified: Secondary | ICD-10-CM | POA: Diagnosis not present

## 2020-04-09 DIAGNOSIS — E669 Obesity, unspecified: Secondary | ICD-10-CM | POA: Diagnosis not present

## 2020-04-09 DIAGNOSIS — D509 Iron deficiency anemia, unspecified: Secondary | ICD-10-CM | POA: Diagnosis not present

## 2020-04-09 DIAGNOSIS — K5900 Constipation, unspecified: Secondary | ICD-10-CM | POA: Insufficient documentation

## 2020-04-09 DIAGNOSIS — Z800 Family history of malignant neoplasm of digestive organs: Secondary | ICD-10-CM | POA: Insufficient documentation

## 2020-04-09 DIAGNOSIS — Z833 Family history of diabetes mellitus: Secondary | ICD-10-CM | POA: Diagnosis not present

## 2020-04-09 DIAGNOSIS — Z8249 Family history of ischemic heart disease and other diseases of the circulatory system: Secondary | ICD-10-CM | POA: Diagnosis not present

## 2020-04-09 DIAGNOSIS — I10 Essential (primary) hypertension: Secondary | ICD-10-CM | POA: Diagnosis not present

## 2020-04-09 DIAGNOSIS — Z801 Family history of malignant neoplasm of trachea, bronchus and lung: Secondary | ICD-10-CM | POA: Diagnosis not present

## 2020-04-09 NOTE — Progress Notes (Signed)
[place], [place] [address]   CLINIC:  Medical Oncology/Hematology  Patient Care Team: Fayrene Helper, MD as PCP - General  CHIEF COMPLAINTS/PURPOSE OF CONSULTATION:  Evaluation of thrombocytosis  HISTORY OF PRESENTING ILLNESS:  Rose Mcguire 44 y.o. female is here because of thrombocytosis, at the request of Dr. Tula Nakayama. She has had elevated platelet count since at least 2008.  Today she reports feeling okay. She reports that she just started taking iron tablets by Dr. Moshe Cipro after 10/18; she is also taking Colace. She reports having occasional clots during her menstrual cycles, but denies DVT's or PE's. She was just started on Micronor by Dr. Elonda Husky to begin during her next cycle. She has lost about 25 lbs since starting phentermine since the beginning of 2021. She denies changes in skin color of her fingers, itching after hot shower, headaches, leg swelling, rashes, vision changes or blurry vision. She denies ever being diagnosed with lupus or RA. She has never had a platelet or blood transfusion. She is not currently taking ASA 81, antibiotics or steroids and denies having recurrent infections.  She is scheduled for a colonoscopy in January. 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 She is a non-smoker. Her maternal aunt had pancreatic cancer; maternal uncle had throat cancer; maternal cousin had colon cancer. She denies presence of hemochromatosis or thrombocytosis in her family.  Today she reports feeling well.  She has started the iron supplements as recommended and has noticed a difference in her energy level.  She continues phentermine and to lose weight.  She has energy and is able to exercise several times per week.   MEDICAL HISTORY:  Past Medical History:  Diagnosis Date  . Allergy   . Hypertension   . Hypertensive retinopathy    OU  . Obesity     SURGICAL HISTORY: No past surgical history on  file.  SOCIAL HISTORY: Social History   Socioeconomic History  . Marital status: Soil scientist    Spouse name: Not on file  . Number of children: 2  . Years of education: Not on file  . Highest education level: Not on file  Occupational History  . Occupation: CNA     Employer: [employer]  Tobacco Use  . Smoking status: Never Smoker  . Smokeless tobacco: Never Used  Vaping Use  . Vaping Use: Never used  Substance and Sexual Activity  . Alcohol use: Yes    Comment: occasionally  . Drug use: No  . Sexual activity: Yes    Birth control/protection: None  Other Topics Concern  . Not on file  Social History Narrative  . Not on file   Social Determinants of Health   Financial Resource Strain: Low Risk   . Difficulty of Paying Living Expenses: Not hard at all  Food Insecurity: No Food Insecurity  . Worried About Charity fundraiser in the Last Year: Never true  . Ran Out of Food in the Last Year: Never true  Transportation Needs: No Transportation Needs  . Lack of Transportation (Medical): No  . Lack of Transportation (Non-Medical): No  Physical Activity: Sufficiently Active  . Days of Exercise per Week: 5 days  . Minutes of Exercise per Session: 30 min  Stress: No Stress Concern Present  . Feeling of Stress : Not at all  Social Connections: Moderately Integrated  . Frequency of Communication with Friends and Family: More than three times a week  .  Frequency of Social Gatherings with Friends and Family: More than three times a week  . Attends Religious Services: More than 4 times per year  . Active Member of Clubs or Organizations: No  . Attends Archivist Meetings: Never  . Marital Status: Living with partner  Intimate Partner Violence: Not At Risk  . Fear of Current or Ex-Partner: No  . Emotionally Abused: No  . Physically Abused: No  . Sexually Abused: No    FAMILY HISTORY: Family History  Problem Relation Age of Onset  . Hypertension Mother   .  Diabetes Father   . Hypertension Father     ALLERGIES:  has No Known Allergies.  MEDICATIONS:  Current Outpatient Medications  Medication Sig Dispense Refill  . docusate sodium (COLACE) 100 MG capsule Take 100 mg by mouth daily as needed for mild constipation.    . ergocalciferol (VITAMIN D2) 1.25 MG (50000 UT) capsule Take 1 capsule (50,000 Units total) by mouth once a week. One capsule once weekly 12 capsule 1  . ferrous sulfate 325 (65 FE) MG tablet Take 1 tablet (325 mg total) by mouth daily with breakfast. 90 tablet 1  . norethindrone (MICRONOR) 0.35 MG tablet Take 1 tablet (0.35 mg total) by mouth daily. Take 1 a day 28 tablet 11  . phentermine 37.5 MG capsule Take 37.5 mg by mouth every morning.    . triamterene-hydrochlorothiazide (MAXZIDE) 75-50 MG tablet Take 1 tablet by mouth daily. 30 tablet 5   No current facility-administered medications for this visit.    REVIEW OF SYSTEMS:   Review of Systems  Constitutional: Negative for appetite change, fatigue and unexpected weight change.  Eyes: Negative for eye problems.  Cardiovascular: Negative for leg swelling.  Gastrointestinal: Positive for constipation.  Skin: Negative for itching and rash.  Neurological: Negative for headaches.  All other systems reviewed and are negative.    PHYSICAL EXAMINATION: ECOG PERFORMANCE STATUS: 0 - Asymptomatic  Vitals:   04/09/20 1115  BP: (!) 149/101  Pulse: (!) 102  Resp: 18  Temp: (!) 97.3 F (36.3 C)  SpO2: 100%   Filed Weights   04/09/20 1115  Weight: 266 lb 8 oz (120.9 kg)   Physical Exam Vitals reviewed.  Constitutional:      Appearance: Normal appearance. She is obese.  Cardiovascular:     Rate and Rhythm: Normal rate and regular rhythm.     Pulses: Normal pulses.     Heart sounds: Normal heart sounds.  Pulmonary:     Effort: Pulmonary effort is normal.     Breath sounds: Normal breath sounds.  Abdominal:     Palpations: Abdomen is soft. There is no  hepatomegaly, splenomegaly or mass.     Tenderness: There is no abdominal tenderness.     Hernia: No hernia is present.  Musculoskeletal:     Right lower leg: No edema.     Left lower leg: No edema.  Lymphadenopathy:     Cervical: No cervical adenopathy.     Upper Body:     Right upper body: No supraclavicular, axillary or pectoral adenopathy.     Left upper body: No supraclavicular, axillary or pectoral adenopathy.  Neurological:     General: No focal deficit present.     Mental Status: She is alert and oriented to person, place, and time.  Psychiatric:        Mood and Affect: Mood normal.        Behavior: Behavior normal.  LABORATORY DATA:  I have reviewed the data as listed Recent Results (from the past 2160 hour(s))  POCT glucose (manual entry)     Status: Abnormal   Collection Time: 02/19/20  9:38 AM  Result Value Ref Range   POC Glucose 5.8 (A) 70 - 99 mg/dl  BMP8+eGFR     Status: Abnormal   Collection Time: 02/21/20  1:29 PM  Result Value Ref Range   Glucose 87 65 - 99 mg/dL   BUN 12 6 - 24 mg/dL   Creatinine, Ser 0.85 0.57 - 1.00 mg/dL   GFR calc non Af Amer 85 >59 mL/min/1.73   GFR calc Af Amer 98 >59 mL/min/1.73    Comment: **In accordance with recommendations from the NKF-ASN Task force,**   [facility] is in the process of updating its eGFR calculation to the   2021 CKD-EPI creatinine equation that estimates kidney function   without a race variable.    BUN/Creatinine Ratio 14 9 - 23   Sodium 134 134 - 144 mmol/L   Potassium 3.9 3.5 - 5.2 mmol/L   Chloride 93 (L) 96 - 106 mmol/L   CO2 25 20 - 29 mmol/L   Calcium 10.4 (H) 8.7 - 10.2 mg/dL  CBC     Status: Abnormal   Collection Time: 02/21/20  1:29 PM  Result Value Ref Range   WBC 8.3 3.4 - 10.8 x10E3/uL   RBC 4.44 3.77 - 5.28 x10E6/uL   Hemoglobin 11.3 11.1 - 15.9 g/dL   Hematocrit 36.3 34.0 - 46.6 %   MCV 82 79 - 97 fL   MCH 25.5 (L) 26.6 - 33.0 pg   MCHC 31.1 (L) 31 - 35 g/dL   RDW 13.4 11.7 -  15.4 %   Platelets 588 (H) 150 - 450 x10E3/uL  Iron     Status: None   Collection Time: 02/21/20  1:29 PM  Result Value Ref Range   Iron 30 27 - 159 ug/dL  Ferritin     Status: None   Collection Time: 02/21/20  1:29 PM  Result Value Ref Range   Ferritin 47 15.0 - 150.0 ng/mL  VITAMIN D 25 Hydroxy (Vit-D Deficiency, Fractures)     Status: Abnormal   Collection Time: 02/21/20  1:29 PM  Result Value Ref Range   Vit D, 25-Hydroxy 23.9 (L) 30.0 - 100.0 ng/mL    Comment: Vitamin D deficiency has been defined by the [place] and an Endocrine Society practice guideline as a level of serum 25-OH vitamin D less than 20 ng/mL (1,2). The Endocrine Society went on to further define vitamin D insufficiency as a level between 21 and 29 ng/mL (2). 1. IOM (Institute of Medicine). 2010. Dietary reference    intakes for calcium and D. [place]: The    [employer]. 2. Holick MF, Binkley [place], Bischoff-Ferrari HA, et al.    Evaluation, treatment, and prevention of vitamin D    deficiency: an Endocrine Society clinical practice    guideline. JCEM. 2011 Jul; 96(7):1911-30.   C-reactive protein     Status: Abnormal   Collection Time: 03/18/20  9:41 AM  Result Value Ref Range   CRP 3.4 (H) <1.0 mg/dL    Comment: Performed at [facility], [address], [place], [place] [address]  Sedimentation rate     Status: Abnormal   Collection Time: 03/18/20  9:41 AM  Result Value Ref Range   Sed Rate 74 (H) 0 - 22 mm/hr    Comment: Performed  at [facility], [address], [place], [place] [address]  Rheumatoid factor     Status: None   Collection Time: 03/18/20  9:41 AM  Result Value Ref Range   Rhuematoid fact SerPl-aCnc <10.0 0.0 - 13.9 IU/mL    Comment: (NOTE) Performed At: [facility] [place], [place] [id number] Rush Farmer MD [id number]   ANA, IFA (with reflex)     Status: None   Collection Time: 03/18/20  9:41 AM  Result Value Ref Range    ANA Ab, IFA Negative     Comment: (NOTE)                                     Negative   <1:80                                     Borderline  1:80                                     Positive   >1:80 ICAP nomenclature: AC-0 For more information about Hep-2 cell patterns use ANApatterns.org, the official website for the International Consensus on Antinuclear Antibody (ANA) Patterns (ICAP). Performed At: [facility] [address] [place], [place] [id number] Rush Farmer MD [id number]   JAK2 V617F, w Reflex to CALR/E12/MPL     Status: None   Collection Time: 03/18/20  9:41 AM  Result Value Ref Range   JAK2 GenotypR Comment     Comment: (NOTE) Result: NEGATIVE for the JAK2 V617F mutation. Interpretation:  The G to T nucleotide change encoding the V617F mutation was not detected.  This result does not rule out the presence of the JAK2 mutation at a level below the sensitivity of detection of this assay, or the presence of other mutations within JAK2 not detected by this assay.  This result does not rule out a diagnosis of polycythemia vera, essential thrombocythemia or idiopathic myelofibrosis as the V617F mutation is not detected in all patients with these disorders.    BACKGROUND: Comment     Comment: (NOTE) JAK2 is a cytoplasmic tyrosine kinase with a key role in signal transduction from multiple hematopoietic growth factor receptors. A point mutation within exon 14 of the JAK2 gene (H7342A) encoding a valine to phenylalanine substitution at position 617 of the JAK2 protein (V617F) has been identified in most patients with polycythemia vera, and in about half of those with either essential thrombocythemia or idiopathic myelofibrosis. The V617F has also been detected, although infrequently, in other myeloid disorders such as chronic myelomonocytic leukemia and chronic neutrophilic luekemia. V617F is an acquired mutation that alters a highly conserved  valine present in the negative regulatory JH2 domain of the JAK2 protein and is predicted to dysregulate kinase activity. Methodology: Total genomic DNA was extracted and subjected to TaqMan real-time PCR amplification/detection. Two amplification products per sample were monitored by real-time PCR using primers/probes s pecific to JAK2 wild type (WT) and JAK2 mutant V617F. The ABI7900 Absolute Quantitation software will compare the patient specimen valuse to the standard curves and generate percent values for wild type and mutant type. In vitro studies have indicated that this assay has an analytical sensitivity of 1%. References: Baxter EJ, Scott Phineas Real, et al. Acquired mutation  of the tyrosine kinase JAK2 in human myeloproliferative disorders. Lancet. 2005 Mar 19-25; 365(9464):1054-1061. Alfonso Ramus Couedic JP. A unique clonal JAK2 mutation leading to constitutive signaling causes polycythaemia vera. Nature. 2005 Apr 28; 434(7037):1144-1148. Kralovics R, Passamonti F, Buser AS, et al. A gain-of-function mutation of JAK2 in myeloproliferative disorders. N Engl J Med. 2005 Apr 28; 352(17):1779-1790.    Director Review, JAK2 Comment     Comment: (NOTE) Loni Muse, PhD, [facility]    Director, [place], [place] [address]    [phone] This test was developed and its performance characteristics determined by [facility]. It has not been cleared or approved by the Food and Drug Administration.    REFLEX: Comment     Comment: (NOTE) Reflex to CALR Mutation Analysis, JAK2 Exon 12-15 Mutation Analysis, and MPL Mutation Analysis is indicated.    Extraction Completed     Comment: (NOTE) Performed At: [facility] [address] [place], [place] [id number] Katina Degree MDPhD [id number] Performed At: [facility] [address] [place], [place] [id number] Katina Degree MDPhD  [id number]   Reticulocytes     Status: Abnormal   Collection Time: 03/18/20  9:41 AM  Result Value Ref Range   Retic Ct Pct 2.0 0.4 - 3.1 %   RBC. 4.49 3.87 - 5.11 MIL/uL   Retic Count, Absolute 87.6 19.0 - 186.0 K/uL   Immature Retic Fract 16.9 (H) 2.3 - 15.9 %    Comment: Performed at [facility], [address], [place], [place] [address]  Folate     Status: None   Collection Time: 03/18/20  9:41 AM  Result Value Ref Range   Folate 8.9 >5.9 ng/mL    Comment: Performed at [facility], [address] [place], [place] [address]  Ferritin     Status: None   Collection Time: 03/18/20  9:41 AM  Result Value Ref Range   Ferritin 26 11 - 307 ng/mL    Comment: Performed at [facility], [address] [place], [place] [address]  Iron and TIBC     Status: None   Collection Time: 03/18/20  9:41 AM  Result Value Ref Range   Iron 52 28 - 170 ug/dL   TIBC 387 250 - 450 ug/dL   Saturation Ratios 13 10.4 - 31.8 %   UIBC 335 ug/dL    Comment: Performed at [facility], [address] [place], [place] [address]  Lactate dehydrogenase     Status: None   Collection Time: 03/18/20  9:41 AM  Result Value Ref Range   LDH 134 98 - 192 U/L    Comment: Performed at [facility], [address] [place], [place] [address]  Comprehensive metabolic panel     Status: Abnormal   Collection Time: 03/18/20  9:41 AM  Result Value Ref Range   Sodium 134 (L) 135 - 145 mmol/L   Potassium 3.3 (L) 3.5 - 5.1 mmol/L   Chloride 95 (L) 98 - 111 mmol/L   CO2 30 22 - 32 mmol/L   Glucose, Bld 101 (H) 70 - 99 mg/dL    Comment: Glucose reference range applies only to samples taken after fasting for at least 8 hours.   BUN 10 6 - 20 mg/dL   Creatinine, Ser 0.93 0.44 - 1.00 mg/dL   Calcium 9.8 8.9 - 10.3 mg/dL   Total Protein 8.5 (H) 6.5 - 8.1  g/dL   Albumin 3.9 3.5 - 5.0 g/dL   AST 11 (L) 15 - 41 U/L   ALT 9 0 - 44 U/L   Alkaline Phosphatase 74 38 - 126 U/L   Total Bilirubin 0.4 0.3 - 1.2 mg/dL   GFR,  Estimated >60 >60 mL/min    Comment: (NOTE) Calculated using the CKD-EPI Creatinine Equation (2021)    Anion gap 9 5 - 15    Comment: Performed at [facility], [address] [place], [place] [address]  CBC with Differential     Status: Abnormal   Collection Time: 03/18/20  9:41 AM  Result Value Ref Range   WBC 8.1 4.0 - 10.5 K/uL   RBC 4.44 3.87 - 5.11 MIL/uL   Hemoglobin 11.6 (L) 12.0 - 15.0 g/dL   HCT 38.5 36 - 46 %   MCV 86.7 80.0 - 100.0 fL   MCH 26.1 26.0 - 34.0 pg   MCHC 30.1 30.0 - 36.0 g/dL   RDW 14.2 11.5 - 15.5 %   Platelets 528 (H) 150 - 400 K/uL   nRBC 0.0 0.0 - 0.2 %   Neutrophils Relative % 72 %   Neutro Abs 5.7 1.7 - 7.7 K/uL   Lymphocytes Relative 23 %   Lymphs Abs 1.9 0.7 - 4.0 K/uL   Monocytes Relative 5 %   Monocytes Absolute 0.4 0.1 - 1.0 K/uL   Eosinophils Relative 0 %   Eosinophils Absolute 0.0 0.0 - 0.5 K/uL   Basophils Relative 0 %   Basophils Absolute 0.0 0.0 - 0.1 K/uL   Immature Granulocytes 0 %   Abs Immature Granulocytes 0.03 0.00 - 0.07 K/uL    Comment: Performed at [facility], [address], [place], [place] [address]  CALR + JAK2 E12-15 + MPL (reflexed)     Status: None   Collection Time: 03/18/20  9:41 AM  Result Value Ref Range   CALR Mutation Detection Result Comment     Comment: (NOTE) NEGATIVE No insertions or deletions were detected within the analyzed region of the calreticulin (CALR) gene. A negative result does not entirely exclude the possibility of a clonal population carrying CALR gene mutations that are not covered by this assay. Results should be interpreted in conjunction with clinical and laboratory findings for the most accurate interpretation.    Background: Comment     Comment: (NOTE) The calcium-binding endoplasmic reticulin chaperone protein, calreticulin (CALR), is somatically mutated in approximately 70% of patients with JAK2-negative essential thrombocythemia (ET) and 60- 88% of patients with JAK2-negative  primary myelofibrosis(PMF). Only a minority of patients (approximately 8%) with myelodysplasia have mutations in  CALR gene. CALR mutations are rarely detected in patients with de novo acute myeloid leukemia, chronic myelogenous leukemia, lymphoid leukemia, or solid tumors. CALR mutations are not detected in polycythemia and generally appear to be mutually exclusive with JAK2 mutations and MPL mutations. The majority of mutational changes involve a variety of insertion or deletion mutations in exon 9 of the calreticulin gene: approximately 53% of all CALR mutations are a 52 bp deletion (type-1) while the second most prevalent mutation (approximately 32%) contains a 5 bp insertion (type-2). Other mutations (non-type 1 or type 2) are seen  in a small minority of cases. CALR mutations in PMF tend to be associated with a favorable prognosis compared to JAK2 V617F mutations, whereas primary myelofibrosis negative for CALR, JAK2 V617F and MPL mutations (so-called triple negative) is associated with a poor prognosis and shorter survival. The detection of a  CALR gene mutation aids in the specific diagnosis of a myeloproliferative neoplasm, and help distinguish this clonal disease from a benign reactive process.    Methodology: Comment     Comment: (NOTE) Genomic DNA was isolated from the provided specimen. Polymerase chain reaction (PCR) of exon 9 of the CALR gene was performed with specific fluorescent-labeled primers, and the PCR product was analyzed by capillary gel electrophoresis to determine the size of the PCR products. This PCR assay is capable of detecting a mutant cell population with a sensitivity of 5 mutant cells per 100 normal cells. A negative result does not exclude the presence of a myeloproliferative disorder or other neoplastic process. This test was developed and its performance characteristics determined by [facility]. It has not been cleared or approved by the Food and  Drug Administration. The FDA has determined that such clearance or approval is not necessary.    References: Comment     Comment: (NOTE) 1. Klampfel, T. et al. (2013) Somatic mutations of calreticulin in   myeloproliferative neoplasms. New Engl. J. Med. 355:[phone]. 2. Haynes Kerns et al. (2013) Somatic CALR mutations in   myeloproliferative neoplasms with nonmutated JAK2. [place] Engl. J.   Med. [phone].    Director Review Comment     Comment: (NOTE) Loni Muse, PhD, [facility]    Director, [place] for [place],  [address]    [phone]    JAK2 Exons 12-15 Mut Det PCR: Comment     Comment: (NOTE) NEGATIVE JAK2 mutations were not detected in exons 12, 13, 14 and 15. This result does not rule out the presence of JAK2 mutation at a level below the detection sensitivity of this assay, the presence of other mutations outside the analyzed region of the JAK2 gene, or the presence of a myeloproliferative or other neoplasm. Result must be correlated with other clinical data for the most accurate diagnosis.    BACKGROUND: Comment     Comment: (NOTE) JAK2 V617F mutation is detected in patients with polycythemia vera (95%), essential thrombocythemia (50%) and primary myelofibrosis (50%). A small percentage of JAK2 mutation positive patients (3.3%) contain other non-V617F mutations within exons 12 to 15. The detection of a JAK2 gene mutation aids in the specific diagnosis of a myeloproliferative neoplasm, and help distinguish this clonal disease from a benign reactive process.    Method Comment     Comment: (NOTE) Total RNA was purified from the provided specimen. The JAK2 gene region covering exons 12 to 15 was subjected to reverse- transcription coupled PCR amplification, and bi-directional sequencing to identify sequence variations. This assay has a sensitivity to detect approximately 15% population of  cells containing the JAK2 mutations in a background of non-mutant cells. This test was developed and its performance characteristics determined by [facility]. It has not been cleared or approved by the Food and Drug Administration.    References Comment     Comment: (NOTE) Algasham, N. et al. Detection of mutations in JAK2 exons 12-15 by Sanger sequencing. Int J Lab Hemato. 2015, 38:34-41. Joelene Millin al. Mutation profile of JAK2 transcripts in patients with chronic myeloproliferative neoplasias. J Mol Diagn. 2009, 11:49-53.    DIRECTOR REVIEW: Comment     Comment: (NOTE) Kirby Crigler, PhD, [place] Associate Technical Director, [place] for Molecular Biology / Pathology   [facility]) [place] [address], [place],  [place], [place]   [phone]    MPL MUTATION ANALYSIS RESULT: Comment     Comment: (NOTE) No MPL mutation was identified in the provided specimen of this individual. Results should be interpreted in conjunction with clinical and other laboratory findings for the most accurate interpretation.    BACKGROUND: Comment     Comment: (NOTE) MPL (myeloproliferative leukemia virus oncogene homology) belongs to the hematopoietin superfamily and enables its ligand thrombopoietin to facilitate both global hematopoiesis and megakaryocyte growth and differentiation. MPL W515 mutations are present in patients with primary myelofibrosis (PMF) and essential thrombocythemia (ET) at a frequency of approximately 5% and 1% respectively. The S505 mutation is detected in patients with hereditary thrombocythemia.    METHODOLOGY: Comment     Comment: (NOTE) Genomic DNA was purified from the provided specimen. MPL gene region covering the S505N and W515L/K mutations were subjected to PCR amplification and bi-directional sequencing in duplicate to identify sequence variations. This assay  has a sensitivity to detect approximately 20-25% population of cells containing the MPL mutations in a background of non-mutant cells. This assay will not detect the mutation below the sensitivity of this assay. Molecular- based testing is highly accurate, but as in any laboratory test, rare diagnostic errors may occur.    REFERENCES: Comment     Comment: (NOTE) 1. Pardanani AD, et al. (2006). MPL515 mutations in   myeloproliferative and other myeloid disorders: a study   of 1182 patients. Blood 144:3154-0086. 2. Andre Lefort and Levine RL. (2008). JAK2 and MPL   mutations in myeloproliferative neoplasms: discovery and   science. Leukemia 22:1813-1817. 3. Juline Patch, et al. (2009). Evidence for a founder effect   of the MPL-S505N mutation in eight [place] pedigrees with   hereditary thrombocythemia. Haematologica 94(10):1368-   7619.    DIRECTOR REVIEW: Comment     Comment: (NOTE) Kirby Crigler, PhD, [place] Associate Technical Director, [place] for Molecular Biology / Pathology   [place]) [place], [place],   [place], [place]   [phone] This test was developed and its performance characteristics determined by [facility]. It has not been cleared or approved by the Food and Drug Administration.    Extraction Comment     Comment: (NOTE) This sample has been received and DNA extraction has been performed. Performed At: [facility] [address] [place], [place] [id number] Katina Degree MDPhD [id number] Performed At: [facility] [address] [place], [place] [id number] Katina Degree MDPhD [id number]     RADIOGRAPHIC STUDIES: I have personally reviewed the radiological images as listed and agreed with the findings in the report. No results found.  ASSESSMENT:  1.  Thrombocytosis: -Patient seen at the request of Dr. Moshe Cipro for isolated  thrombocytosis since 2008. -Her ferritin on 02/21/2020 was 47.  Patient was told to start taking iron tablet since then. -She does not have any history of thrombosis.  She is on progesterone only oral contraceptive pill for menorrhagia which was prescribed recently and patient is yet to start.  Never had blood transfusion. -She is on phentermine for few months and has lost 24 pounds.  Denies any fevers or night sweats. -No history of connective tissue disorders. -No vasomotor symptoms, erythromelalgia's, or aquagenic pruritus.  2.  Social/family history: -She works at a [employer] for [employer] in [place].  She is a non-smoker. -Maternal aunt had pancreatic cancer.  Maternal uncle had throat cancer and a maternal cousin has colon cancer.  PLAN:  1.  Thrombocytosis: -Repeat lab work from 03/18/2020 showed improvement of her platelet count from 588,000-528,000. -Ferritin was 26.  She started on iron supplements as recommended.  Appears to be tolerating well. -Her ANA, lupus anticoagulant and rheumatoid factor was negative but her ESR and CRP were slightly elevated. -JAK2 with reflex mutation was negative. -Thrombocytosis likely secondary to iron deficiency anemia. -Recommend repeat labs today (CBC, CMP) and again in 3 months (CBC, CMP, Ferritin, Iron panel).  2.  Iron deficiency anemia: -Ferritin was 26.  Hemoglobin 11.6 -She was started on iron supplements and has been tolerating well. -Has GI appointment in January 2022 and colonoscopy shortly after. -Denies any active bleeding.  Denies heavy menstrual cycles.  Disposition: -Continue oral iron supplements. -RTC in 3 months for repeat labs (CBC, CMP, ferritin, iron panel) and assessment.  Faythe Casa, NP 04/09/2020 4:43 PM  04/09/20  [place] [phone]

## 2020-05-06 ENCOUNTER — Other Ambulatory Visit: Payer: Self-pay

## 2020-05-06 ENCOUNTER — Ambulatory Visit (INDEPENDENT_AMBULATORY_CARE_PROVIDER_SITE_OTHER): Payer: BC Managed Care – PPO | Admitting: Family Medicine

## 2020-05-06 ENCOUNTER — Encounter: Payer: Self-pay | Admitting: Family Medicine

## 2020-05-06 VITALS — Ht 64.0 in | Wt 262.0 lb

## 2020-05-06 DIAGNOSIS — J029 Acute pharyngitis, unspecified: Secondary | ICD-10-CM | POA: Insufficient documentation

## 2020-05-06 DIAGNOSIS — R059 Cough, unspecified: Secondary | ICD-10-CM | POA: Insufficient documentation

## 2020-05-06 MED ORDER — PREDNISONE 5 MG PO TABS: 5.0000 mg | ORAL_TABLET | Freq: Two times a day (BID) | ORAL | 0 refills | Status: AC

## 2020-05-06 MED ORDER — BENZONATATE 100 MG PO CAPS: 100.0000 mg | ORAL_CAPSULE | Freq: Three times a day (TID) | ORAL | 0 refills | Status: DC | PRN

## 2020-05-06 NOTE — Assessment & Plan Note (Signed)
Prednisone and tessalon perles prescribed, advised to stop lozengers

## 2020-05-06 NOTE — Assessment & Plan Note (Signed)
  Patient re-educated about  the importance of commitment to a  minimum of 150 minutes of exercise per week as able.  The importance of healthy food choices with portion control discussed, as well as eating regularly and within a 12 hour window most days. The need to choose "clean , green" food 50 to 75% of the time is discussed, as well as to make water the primary drink and set a goal of 64 ounces water daily.    Weight /BMI 05/06/2020 04/09/2020 03/18/2020  WEIGHT 262 lb 266 lb 8 oz 256 lb 12.8 oz  HEIGHT 5\' 4"  - 5\' 4"   BMI 44.97 kg/m2 45.74 kg/m2 44.08 kg/m2

## 2020-05-06 NOTE — Patient Instructions (Signed)
F/U as before, call if you need me sooner  Tessalon perles and prednisone are prescribed for your cough and sore throat  Please take claritin daily and stop cough drops.  Drink at least 64 ounces water daily  Get covid tested today, let me know result please Number to call is [phone]

## 2020-05-06 NOTE — Progress Notes (Signed)
Virtual Visit via Telephone Note  I connected with Rose Mcguire on 05/06/20 at  8:20 AM EST by telephone and verified that I am speaking with the correct person using two identifiers.  Location: Patient: home Provider: office   I discussed the limitations, risks, security and privacy concerns of performing an evaluation and management service by telephone and the availability of in person appointments. I also discussed with the patient that there may be a patient responsible charge related to this service. The patient expressed understanding and agreed to proceed.   History of Present Illness: Sore throat and cough since 04/29/2020. No fever or body aches. Was in contact with her brother  who has the flu, his wife tested positive for covid . Rose Mcguire was sent home last Wednesday, no rapid testing done on the job as she  was symptomatic for 4 not 5 days. No one at home is sick she is concerned because of indirect covid exposure. Can be tested on her job tomorrow but would rather know today   Observations/Objective: Ht 5\' 4"  (1.626 m)   Wt 262 lb (118.8 kg)   BMI 44.97 kg/m  Good communication with no confusion and intact memory. Alert and oriented x 3 No signs of respiratory distress during speech    Assessment and Plan: Cough Prednisone and tessalon perles prescribed, advised to stop lozengers  Sore throat Voice rest, fluids, prednisone short term    Follow Up Instructions:    I discussed the assessment and treatment plan with the patient. The patient was provided an opportunity to ask questions and all were answered. The patient agreed with the plan and demonstrated an understanding of the instructions.   The patient was advised to call back or seek an in-person evaluation if the symptoms worsen or if the condition fails to improve as anticipated.  I provided of non-face-to-face time during this encounter.   , MD

## 2020-05-06 NOTE — Assessment & Plan Note (Signed)
Voice rest, fluids, prednisone short term

## 2020-05-13 ENCOUNTER — Other Ambulatory Visit: Payer: Self-pay

## 2020-05-13 ENCOUNTER — Encounter (INDEPENDENT_AMBULATORY_CARE_PROVIDER_SITE_OTHER): Payer: Self-pay

## 2020-05-13 ENCOUNTER — Ambulatory Visit (INDEPENDENT_AMBULATORY_CARE_PROVIDER_SITE_OTHER): Payer: BC Managed Care – PPO | Admitting: Gastroenterology

## 2020-05-13 ENCOUNTER — Telehealth (INDEPENDENT_AMBULATORY_CARE_PROVIDER_SITE_OTHER): Payer: Self-pay

## 2020-05-13 ENCOUNTER — Encounter (INDEPENDENT_AMBULATORY_CARE_PROVIDER_SITE_OTHER): Payer: Self-pay | Admitting: Gastroenterology

## 2020-05-13 ENCOUNTER — Other Ambulatory Visit (INDEPENDENT_AMBULATORY_CARE_PROVIDER_SITE_OTHER): Payer: Self-pay

## 2020-05-13 VITALS — BP 141/85 | HR 112 | Temp 98.2°F | Ht 64.0 in | Wt 268.0 lb

## 2020-05-13 DIAGNOSIS — Z800 Family history of malignant neoplasm of digestive organs: Secondary | ICD-10-CM

## 2020-05-13 DIAGNOSIS — K5900 Constipation, unspecified: Secondary | ICD-10-CM | POA: Insufficient documentation

## 2020-05-13 DIAGNOSIS — K625 Hemorrhage of anus and rectum: Secondary | ICD-10-CM | POA: Diagnosis not present

## 2020-05-13 DIAGNOSIS — D509 Iron deficiency anemia, unspecified: Secondary | ICD-10-CM | POA: Insufficient documentation

## 2020-05-13 DIAGNOSIS — Z1211 Encounter for screening for malignant neoplasm of colon: Secondary | ICD-10-CM

## 2020-05-13 DIAGNOSIS — K5903 Drug induced constipation: Secondary | ICD-10-CM | POA: Diagnosis not present

## 2020-05-13 MED ORDER — PEG 3350-KCL-NA BICARB-NACL 420 G PO SOLR: 4000.0000 mL | ORAL | 0 refills | Status: DC

## 2020-05-13 NOTE — Progress Notes (Signed)
Maylon Peppers, M.D. Gastroenterology & Hepatology [facility] [address] [place], [place] [address] Primary Care Physician: Fayrene Helper, MD [address], [place] [address]  Referring MD: PCP  Chief Complaint: Rectal bleeding, constipation and iron deficiency anemia  History of Present Illness: Rose Mcguire is a 45 y.o. female with past medical history of hypertension, iron deficiency anemia and obesity, who presents for evaluation of rectal bleeding, constipation and iron deficiency anemia.  Patient reports that for the last 2 months she has been taking iron due to a longstanding history of mild anemia.  She reports that she noticed her stool became more black than usual.  However, she states that for the last 6 months she has presented intermittent episodes of rectal bleeding when she wipes, which is usually present when she has not menstrual cycle.  The patient states that this is most frequently seen when she has to strain to have bowel movement.  The patient reported she has had to strain more recently as she has been taking oral iron.  She usually has 1 bowel movement per day.  States that she has to strain more often if he does not drink enough water or eat vegetables.  She has been intentionally losing weight while taking phentermine, lost 30 pounds.  Upon review of her medical record, the patient had presence of mild anemia at least since 2014.  Her most recent labs from 03/18/2020 showed hemoglobin of 11.6 with an MCV of 86.  Iron stores at that time also showed iron of 52, ferritin of 26 and saturation of 13%.  The patient denies having any nausea, vomiting, fever, chills, melena, hematemesis, abdominal distention, abdominal pain, diarrhea, jaundice, pruritus.  Last NTI:RWERX Last Colonoscopy:never  FHx: neg for any gastrointestinal/liver disease, cousin had colorectal cancer at age 85, great aunt  had colorectal cancer.  Also an uncle had prostate cancer, uncle had throat cancer and aunt had pancreatic cancer Social: neg smoking, alcohol or illicit drug use Surgical: no abdominal surgeries  Past Medical History: Past Medical History:  Diagnosis Date  . Allergy   . Hypertension   . Hypertensive retinopathy    OU  . Obesity     Past Surgical History:History reviewed. No pertinent surgical history.  Family History: Family History  Problem Relation Age of Onset  . Hypertension Mother   . Diabetes Father   . Hypertension Father   . Colon cancer Maternal Aunt   . Colon cancer Cousin     Social History: Social History   Tobacco Use  Smoking Status Never Smoker  Smokeless Tobacco Never Used   Social History   Substance and Sexual Activity  Alcohol Use Yes   Comment: occasionally   Social History   Substance and Sexual Activity  Drug Use No    Allergies: No Known Allergies  Medications: Current Outpatient Medications  Medication Sig Dispense Refill  . ergocalciferol (VITAMIN D2) 1.25 MG (50000 UT) capsule Take 1 capsule (50,000 Units total) by mouth once a week. One capsule once weekly 12 capsule 1  . ferrous sulfate 325 (65 FE) MG tablet Take 1 tablet (325 mg total) by mouth daily with breakfast. 90 tablet 1  . norethindrone (MICRONOR) 0.35 MG tablet Take 1 tablet (0.35 mg total) by mouth daily. Take 1 a day 28 tablet 11  . phentermine 37.5 MG capsule Take 37.5 mg by mouth every morning.    . triamterene-hydrochlorothiazide (MAXZIDE) 75-50 MG tablet  Take 1 tablet by mouth daily. 30 tablet 5  . benzonatate (TESSALON PERLES) 100 MG capsule Take 1 capsule (100 mg total) by mouth 3 (three) times daily as needed for cough. (Patient not taking: Reported on 05/13/2020) 30 capsule 0  . docusate sodium (COLACE) 100 MG capsule Take 100 mg by mouth daily as needed for mild constipation. (Patient not taking: Reported on 05/13/2020)     No current facility-administered  medications for this visit.    Review of Systems: GENERAL: negative for malaise, night sweats HEENT: No changes in hearing or vision, no nose bleeds or other nasal problems. NECK: Negative for lumps, goiter, pain and significant neck swelling RESPIRATORY: Negative for cough, wheezing CARDIOVASCULAR: Negative for chest pain, leg swelling, palpitations, orthopnea GI: SEE HPI MUSCULOSKELETAL: Negative for joint pain or swelling, back pain, and muscle pain. SKIN: Negative for lesions, rash PSYCH: Negative for sleep disturbance, mood disorder and recent psychosocial stressors. HEMATOLOGY Negative for prolonged bleeding, bruising easily, and swollen nodes. ENDOCRINE: Negative for cold or heat intolerance, polyuria, polydipsia and goiter. NEURO: negative for tremor, gait imbalance, syncope and seizures. The remainder of the review of systems is noncontributory.   Physical Exam: BP (!) 141/85 (BP Location: Left Arm, Patient Position: Sitting, Cuff Size: Large)   Pulse (!) 112   Temp 98.2 F (36.8 C) (Oral)   Ht 5\' 4"  (1.626 m)   Wt 268 lb (121.6 kg)   BMI 46.00 kg/m  GENERAL: The patient is AO x3, in no acute distress. Obese. HEENT: Head is normocephalic and atraumatic. EOMI are intact. Mouth is well hydrated and without lesions. NECK: Supple. No masses LUNGS: Clear to auscultation. No presence of rhonchi/wheezing/rales. Adequate chest expansion HEART: RRR, normal s1 and s2. ABDOMEN: Soft, nontender, no guarding, no peritoneal signs, and nondistended. BS +. No masses. EXTREMITIES: Without any cyanosis, clubbing, rash, lesions or edema. NEUROLOGIC: AOx3, no focal motor deficit. SKIN: no jaundice, no rashes   Imaging/Labs: as above  I personally reviewed and interpreted the available labs, imaging and endoscopic files.  Impression and Plan: Rose Mcguire is a 45 y.o. female with past medical history of hypertension, iron deficiency anemia and obesity, who presents for  evaluation of rectal bleeding, constipation and iron deficiency anemia.  The patient has presented new onset straining while having a bowel movement after starting the day of iron orally.  This has led to episode of rectal bleeding.  I consider these episodes are likely related to anorectal disease such as hemorrhoids or anal fissure.  Due to this, I counseled the patient to start taking Benefiber on a daily basis as well as to continue taking prunes and kiwi.  Nevertheless, given her previous mild iron deficiency anemia and to rule out any other organic etiologies, the patient will be scheduled for EGD with small bowel biopsy and colonoscopy.  Patient still agreed.  - Schedule EGD with SB biopsies and colonoscopy -Start Benefiber fiber supplements daily to increase stool bulk -Continue oral iron -Take prune and eat kiwi daily  All questions were answered.      Maylon Peppers, MD Gastroenterology and Hepatology [facility]

## 2020-05-13 NOTE — Patient Instructions (Addendum)
Schedule EGD with SB biopsies and colonoscopy Start Benefiber fiber supplements daily to increase stool bulk Continue oral iron Take prune and eat kiwi daily

## 2020-05-13 NOTE — Telephone Encounter (Signed)
LeighAnn Tashera Montalvo, CMA  

## 2020-06-11 NOTE — Patient Instructions (Signed)
Rose Mcguire  06/11/2020     @PREFPERIOPPHARMACY @   Your procedure is scheduled on  06/14/2020.   Report to [facility] at  [phone]  A.M.   Call this number if you have problems the morning of surgery:  [phone]   Remember:  Follow the diet and pre instructions given to you by the office.                       Take these medicines the morning of surgery with A SIP OF WATER  NONE      STOP taking your phentermine.   Please brush your teeth.  Do not wear jewelry, make-up or nail polish.  Do not wear lotions, powders, or perfumes, or deodorant.  Do not shave 48 hours prior to surgery.  Men may shave face and neck.  Do not bring valuables to the hospital.  [facility] is not responsible for any belongings or valuables.  Contacts, dentures or bridgework may not be worn into surgery.  Leave your suitcase in the car.  After surgery it may be brought to your room.  For patients admitted to the hospital, discharge time will be determined by your treatment team.  Patients discharged the day of surgery will not be allowed to drive home and must have someone with them for 24 hours.   Special instructions:   DO NOT smoke tobacco or vape the morning of your procedure.  Please read over the following fact sheets that you were given. Anesthesia Post-op Instructions and Care and Recovery After Surgery       Upper Endoscopy, Adult, Care After This sheet gives you information about how to care for yourself after your procedure. Your health care provider may also give you more specific instructions. If you have problems or questions, contact your health care provider. What can I expect after the procedure? After the procedure, it is common to have:  A sore throat.  Mild stomach pain or discomfort.  Bloating.  Nausea. Follow these instructions at home:  Follow instructions from your health care provider about what to eat or drink after your procedure.  Return to  your normal activities as told by your health care provider. Ask your health care provider what activities are safe for you.  Take over-the-counter and prescription medicines only as told by your health care provider.  If you were given a sedative during the procedure, it can affect you for several hours. Do not drive or operate machinery until your health care provider says that it is safe.  Keep all follow-up visits as told by your health care provider. This is important.   Contact a health care provider if you have:  A sore throat that lasts longer than one day.  Trouble swallowing. Get help right away if:  You vomit blood or your vomit looks like coffee grounds.  You have: ? A fever. ? Bloody, black, or tarry stools. ? A severe sore throat or you cannot swallow. ? Difficulty breathing. ? Severe pain in your chest or abdomen. Summary  After the procedure, it is common to have a sore throat, mild stomach discomfort, bloating, and nausea.  If you were given a sedative during the procedure, it can affect you for several hours. Do not drive or operate machinery until your health care provider says that it is safe.  Follow instructions from your health care provider about what to eat or  drink after your procedure.  Return to your normal activities as told by your health care provider. This information is not intended to replace advice given to you by your health care provider. Make sure you discuss any questions you have with your health care provider. Document Revised: 04/18/2019 Document Reviewed: 09/20/2017 Elsevier Patient Education  2021 [place].  Colonoscopy, Adult, Care After This sheet gives you information about how to care for yourself after your procedure. Your health care provider may also give you more specific instructions. If you have problems or questions, contact your health care provider. What can I expect after the procedure? After the procedure, it is  common to have:  A small amount of blood in your stool for 24 hours after the procedure.  Some gas.  Mild cramping or bloating of your abdomen. Follow these instructions at home: Eating and drinking  Drink enough fluid to keep your urine pale yellow.  Follow instructions from your health care provider about eating or drinking restrictions.  Resume your normal diet as instructed by your health care provider. Avoid heavy or fried foods that are hard to digest.   Activity  Rest as told by your health care provider.  Avoid sitting for a long time without moving. Get up to take short walks every 1-2 hours. This is important to improve blood flow and breathing. Ask for help if you feel weak or unsteady.  Return to your normal activities as told by your health care provider. Ask your health care provider what activities are safe for you. Managing cramping and bloating  Try walking around when you have cramps or feel bloated.  Apply heat to your abdomen as told by your health care provider. Use the heat source that your health care provider recommends, such as a moist heat pack or a heating pad. ? Place a towel between your skin and the heat source. ? Leave the heat on for 20-30 minutes. ? Remove the heat if your skin turns bright red. This is especially important if you are unable to feel pain, heat, or cold. You may have a greater risk of getting burned.   General instructions  If you were given a sedative during the procedure, it can affect you for several hours. Do not drive or operate machinery until your health care provider says that it is safe.  For the first 24 hours after the procedure: ? Do not sign important documents. ? Do not drink alcohol. ? Do your regular daily activities at a slower pace than normal. ? Eat soft foods that are easy to digest.  Take over-the-counter and prescription medicines only as told by your health care provider.  Keep all follow-up visits as  told by your health care provider. This is important. Contact a health care provider if:  You have blood in your stool 2-3 days after the procedure. Get help right away if you have:  More than a small spotting of blood in your stool.  Large blood clots in your stool.  Swelling of your abdomen.  Nausea or vomiting.  A fever.  Increasing pain in your abdomen that is not relieved with medicine. Summary  After the procedure, it is common to have a small amount of blood in your stool. You may also have mild cramping and bloating of your abdomen.  If you were given a sedative during the procedure, it can affect you for several hours. Do not drive or operate machinery until your health care provider says  that it is safe.  Get help right away if you have a lot of blood in your stool, nausea or vomiting, a fever, or increased pain in your abdomen. This information is not intended to replace advice given to you by your health care provider. Make sure you discuss any questions you have with your health care provider. Document Revised: 04/14/2019 Document Reviewed: 11/14/2018 Elsevier Patient Education  2021 [place] After This sheet gives you information about how to care for yourself after your procedure. Your health care provider may also give you more specific instructions. If you have problems or questions, contact your health care provider. What can I expect after the procedure? After the procedure, it is common to have:  Tiredness.  Forgetfulness about what happened after the procedure.  Impaired judgment for important decisions.  Nausea or vomiting.  Some difficulty with balance. Follow these instructions at home: For the time period you were told by your health care provider:  Rest as needed.  Do not participate in activities where you could fall or become injured.  Do not drive or use machinery.  Do not drink alcohol.  Do not  take sleeping pills or medicines that cause drowsiness.  Do not make important decisions or sign legal documents.  Do not take care of children on your own.      Eating and drinking  Follow the diet that is recommended by your health care provider.  Drink enough fluid to keep your urine pale yellow.  If you vomit: ? Drink water, juice, or soup when you can drink without vomiting. ? Make sure you have little or no nausea before eating solid foods. General instructions  Have a responsible adult stay with you for the time you are told. It is important to have someone help care for you until you are awake and alert.  Take over-the-counter and prescription medicines only as told by your health care provider.  If you have sleep apnea, surgery and certain medicines can increase your risk for breathing problems. Follow instructions from your health care provider about wearing your sleep device: ? Anytime you are sleeping, including during daytime naps. ? While taking prescription pain medicines, sleeping medicines, or medicines that make you drowsy.  Avoid smoking.  Keep all follow-up visits as told by your health care provider. This is important. Contact a health care provider if:  You keep feeling nauseous or you keep vomiting.  You feel light-headed.  You are still sleepy or having trouble with balance after 24 hours.  You develop a rash.  You have a fever.  You have redness or swelling around the IV site. Get help right away if:  You have trouble breathing.  You have new-onset confusion at home. Summary  For several hours after your procedure, you may feel tired. You may also be forgetful and have poor judgment.  Have a responsible adult stay with you for the time you are told. It is important to have someone help care for you until you are awake and alert.  Rest as told. Do not drive or operate machinery. Do not drink alcohol or take sleeping pills.  Get help right  away if you have trouble breathing, or if you suddenly become confused. This information is not intended to replace advice given to you by your health care provider. Make sure you discuss any questions you have with your health care provider. Document Revised: 01/04/2020 Document Reviewed: 03/23/2019 Elsevier Patient Education  [place].

## 2020-06-12 ENCOUNTER — Encounter (HOSPITAL_COMMUNITY)
Admission: RE | Admit: 2020-06-12 | Discharge: 2020-06-12 | Disposition: A | Discharge status: Home / Self Care | Payer: BC Managed Care – PPO | Source: Ambulatory Visit | Attending: Gastroenterology | Admitting: Gastroenterology

## 2020-06-12 ENCOUNTER — Encounter (HOSPITAL_COMMUNITY): Payer: Self-pay

## 2020-06-12 ENCOUNTER — Other Ambulatory Visit: Payer: Self-pay

## 2020-06-12 ENCOUNTER — Other Ambulatory Visit (HOSPITAL_COMMUNITY)
Admission: RE | Admit: 2020-06-12 | Discharge: 2020-06-12 | Disposition: A | Discharge status: Home / Self Care | Payer: BC Managed Care – PPO | Source: Ambulatory Visit | Attending: Gastroenterology | Admitting: Gastroenterology

## 2020-06-12 DIAGNOSIS — Z01818 Encounter for other preprocedural examination: Secondary | ICD-10-CM | POA: Insufficient documentation

## 2020-06-12 DIAGNOSIS — Z79899 Other long term (current) drug therapy: Secondary | ICD-10-CM | POA: Diagnosis not present

## 2020-06-12 DIAGNOSIS — K648 Other hemorrhoids: Secondary | ICD-10-CM | POA: Diagnosis not present

## 2020-06-12 DIAGNOSIS — Z6841 Body Mass Index (BMI) 40.0 and over, adult: Secondary | ICD-10-CM | POA: Diagnosis not present

## 2020-06-12 DIAGNOSIS — Z20822 Contact with and (suspected) exposure to covid-19: Secondary | ICD-10-CM | POA: Insufficient documentation

## 2020-06-12 DIAGNOSIS — D509 Iron deficiency anemia, unspecified: Secondary | ICD-10-CM | POA: Diagnosis not present

## 2020-06-12 DIAGNOSIS — I10 Essential (primary) hypertension: Secondary | ICD-10-CM | POA: Diagnosis not present

## 2020-06-12 DIAGNOSIS — K317 Polyp of stomach and duodenum: Secondary | ICD-10-CM | POA: Diagnosis not present

## 2020-06-12 DIAGNOSIS — K5730 Diverticulosis of large intestine without perforation or abscess without bleeding: Secondary | ICD-10-CM | POA: Diagnosis not present

## 2020-06-12 HISTORY — DX: Anemia, unspecified: D64.9

## 2020-06-12 LAB — CBC WITH DIFFERENTIAL/PLATELET
Abs Immature Granulocytes: 0.03 10*3/uL (ref 0.00–0.07)
Basophils Absolute: 0 10*3/uL (ref 0.0–0.1)
Basophils Relative: 0 %
Eosinophils Absolute: 0.1 10*3/uL (ref 0.0–0.5)
Eosinophils Relative: 1 %
HCT: 37 % (ref 36.0–46.0)
Hemoglobin: 11.2 g/dL — ABNORMAL LOW (ref 12.0–15.0)
Immature Granulocytes: 0 %
Lymphocytes Relative: 28 %
Lymphs Abs: 2.6 10*3/uL (ref 0.7–4.0)
MCH: 25.9 pg — ABNORMAL LOW (ref 26.0–34.0)
MCHC: 30.3 g/dL (ref 30.0–36.0)
MCV: 85.6 fL (ref 80.0–100.0)
Monocytes Absolute: 0.5 10*3/uL (ref 0.1–1.0)
Monocytes Relative: 6 %
Neutro Abs: 6 10*3/uL (ref 1.7–7.7)
Neutrophils Relative %: 65 %
Platelets: 533 10*3/uL — ABNORMAL HIGH (ref 150–400)
RBC: 4.32 MIL/uL (ref 3.87–5.11)
RDW: 13.9 % (ref 11.5–15.5)
WBC: 9.3 10*3/uL (ref 4.0–10.5)
nRBC: 0 % (ref 0.0–0.2)

## 2020-06-12 LAB — BASIC METABOLIC PANEL
Anion gap: 9 (ref 5–15)
BUN: 11 mg/dL (ref 6–20)
CO2: 27 mmol/L (ref 22–32)
Calcium: 9.7 mg/dL (ref 8.9–10.3)
Chloride: 99 mmol/L (ref 98–111)
Creatinine, Ser: 0.83 mg/dL (ref 0.44–1.00)
GFR, Estimated: >60 mL/min (ref >60)
Glucose, Bld: 110 mg/dL — ABNORMAL HIGH (ref 70–99)
Potassium: 3.5 mmol/L (ref 3.5–5.1)
Sodium: 135 mmol/L (ref 135–145)

## 2020-06-12 LAB — HCG, SERUM, QUALITATIVE: Preg, Serum: NEGATIVE

## 2020-06-13 LAB — SARS CORONAVIRUS 2 (TAT 6-24 HRS): SARS Coronavirus 2: NEGATIVE

## 2020-06-14 ENCOUNTER — Encounter (HOSPITAL_COMMUNITY)
Admission: RE | Disposition: A | Discharge status: Home / Self Care | Payer: Self-pay | Source: Home / Self Care | Attending: Gastroenterology

## 2020-06-14 ENCOUNTER — Encounter (HOSPITAL_COMMUNITY): Payer: Self-pay | Admitting: Gastroenterology

## 2020-06-14 ENCOUNTER — Ambulatory Visit (HOSPITAL_COMMUNITY): Payer: BC Managed Care – PPO | Admitting: Anesthesiology

## 2020-06-14 ENCOUNTER — Ambulatory Visit (HOSPITAL_COMMUNITY)
Admission: RE | Admit: 2020-06-14 | Discharge: 2020-06-14 | Disposition: A | Discharge status: Home / Self Care | Payer: BC Managed Care – PPO | Attending: Gastroenterology | Admitting: Gastroenterology

## 2020-06-14 DIAGNOSIS — Z6841 Body Mass Index (BMI) 40.0 and over, adult: Secondary | ICD-10-CM | POA: Diagnosis not present

## 2020-06-14 DIAGNOSIS — K5730 Diverticulosis of large intestine without perforation or abscess without bleeding: Secondary | ICD-10-CM

## 2020-06-14 DIAGNOSIS — K625 Hemorrhage of anus and rectum: Secondary | ICD-10-CM | POA: Diagnosis not present

## 2020-06-14 DIAGNOSIS — I10 Essential (primary) hypertension: Secondary | ICD-10-CM | POA: Insufficient documentation

## 2020-06-14 DIAGNOSIS — Z79899 Other long term (current) drug therapy: Secondary | ICD-10-CM | POA: Diagnosis not present

## 2020-06-14 DIAGNOSIS — K648 Other hemorrhoids: Secondary | ICD-10-CM | POA: Insufficient documentation

## 2020-06-14 DIAGNOSIS — K317 Polyp of stomach and duodenum: Secondary | ICD-10-CM | POA: Insufficient documentation

## 2020-06-14 DIAGNOSIS — Z20822 Contact with and (suspected) exposure to covid-19: Secondary | ICD-10-CM | POA: Diagnosis not present

## 2020-06-14 DIAGNOSIS — D509 Iron deficiency anemia, unspecified: Secondary | ICD-10-CM | POA: Diagnosis not present

## 2020-06-14 HISTORY — PX: COLONOSCOPY WITH PROPOFOL: SHX5780

## 2020-06-14 HISTORY — PX: BIOPSY: SHX5522

## 2020-06-14 HISTORY — PX: ESOPHAGOGASTRODUODENOSCOPY (EGD) WITH PROPOFOL: SHX5813

## 2020-06-14 SURGERY — COLONOSCOPY WITH PROPOFOL
Anesthesia: General

## 2020-06-14 MED ORDER — LACTATED RINGERS IV SOLN
INTRAVENOUS | Status: DC
  Administered 2020-06-14: 1000 mL via INTRAVENOUS

## 2020-06-14 MED ORDER — PROPOFOL 10 MG/ML IV BOLUS
INTRAVENOUS | Status: DC | PRN
  Administered 2020-06-14: 100 mg via INTRAVENOUS
  Administered 2020-06-14: 50 mg via INTRAVENOUS
  Administered 2020-06-14: 100 mg via INTRAVENOUS

## 2020-06-14 MED ORDER — STERILE WATER FOR IRRIGATION IR SOLN
Status: DC | PRN
  Administered 2020-06-14: 100 mL

## 2020-06-14 MED ORDER — PROPOFOL 500 MG/50ML IV EMUL
INTRAVENOUS | Status: DC | PRN
  Administered 2020-06-14: 150 ug/kg/min via INTRAVENOUS

## 2020-06-14 MED ORDER — LIDOCAINE HCL (CARDIAC) PF 100 MG/5ML IV SOSY
PREFILLED_SYRINGE | INTRAVENOUS | Status: DC | PRN
  Administered 2020-06-14: 50 mg via INTRAVENOUS

## 2020-06-14 NOTE — Anesthesia Postprocedure Evaluation (Signed)
Anesthesia Post Note  Patient: Rose Mcguire  Procedure(s) Performed: COLONOSCOPY WITH PROPOFOL (N/A ) ESOPHAGOGASTRODUODENOSCOPY (EGD) WITH PROPOFOL (N/A ) BIOPSY  Patient location during evaluation: Phase II Anesthesia Type: General Level of consciousness: awake and awake and alert Pain management: pain level controlled Vital Signs Assessment: post-procedure vital signs reviewed and stable Respiratory status: spontaneous breathing, nonlabored ventilation and respiratory function stable Cardiovascular status: blood pressure returned to baseline and stable Postop Assessment: no apparent nausea or vomiting Anesthetic complications: no   No complications documented.   Last Vitals:  Vitals:   06/14/20 0734 06/14/20 0916  BP: 122/60 116/78  Pulse: 95 89  Resp: 15 19  Temp: 37.1 C 36.4 C  SpO2: 100% 97%    Last Pain:  Vitals:   06/14/20 0916  TempSrc: Oral  PainSc: 0-No pain                 Orlie Dakin

## 2020-06-14 NOTE — Anesthesia Procedure Notes (Signed)
Date/Time: 06/14/2020 8:32 AM Performed by: Orlie Dakin, CRNA Pre-anesthesia Checklist: Patient identified, Emergency Drugs available, Suction available and Patient being monitored Patient Re-evaluated:Patient Re-evaluated prior to induction Oxygen Delivery Method: Nasal cannula Induction Type: IV induction Placement Confirmation: positive ETCO2

## 2020-06-14 NOTE — H&P (Signed)
Rose Mcguire is an 45 y.o. female.   Chief Complaint: Iron deficiency anemia HPI: SVARA TWYMAN is a 45 y.o. female with past medical history of hypertension, iron deficiency anemia and obesity, who comes to the hospital for evaluation of iron deficiency anemia.  The patient was found to have anemia since 2014.  She denies having any melena but had some intermittent episodes of rectal bleeding when she wipes usually when she has menstrual..  She denies having any ongoing hematochezia on a daily basis.  Denies any abdominal pain, nausea, vomiting, fever, chills.  Notably, she had previous blood testing on 03/18/2020 that showed an iron saturation of 13%.  Ferritin of 26 and iron of 52.  Most recent Blood work-up showed the hemoglobin of 11.2 with MCV of 85.  Patient has not had any endoscopy or colonoscopy in the past.  Family history is significant for a cousin with colorectal cancer at age 23 and a great aunt with colorectal cancer but no first-degree abdominal members with history of colon cancer.    Patient denies taking any NSAIDs or anticoagulants.  Past Medical History:  Diagnosis Date  . Allergy   . Anemia   . Hypertension   . Hypertensive retinopathy    OU  . Obesity     History reviewed. No pertinent surgical history.  Family History  Problem Relation Age of Onset  . Hypertension Mother   . Diabetes Father   . Hypertension Father   . Colon cancer Maternal Aunt   . Colon cancer Cousin    Social History:  reports that she has never smoked. She has never used smokeless tobacco. She reports current alcohol use. She reports that she does not use drugs.  Allergies: No Known Allergies  Medications Prior to Admission  Medication Sig Dispense Refill  . docusate sodium (COLACE) 100 MG capsule Take 100 mg by mouth daily as needed for mild constipation.    . ergocalciferol (VITAMIN D2) 1.25 MG (50000 UT) capsule Take 1 capsule (50,000 Units total) by mouth once a week. One  capsule once weekly (Patient taking differently: Take 50,000 Units by mouth every Monday.) 12 capsule 1  . ferrous sulfate 325 (65 FE) MG tablet Take 1 tablet (325 mg total) by mouth daily with breakfast. 90 tablet 1  . norethindrone (MICRONOR) 0.35 MG tablet Take 1 tablet (0.35 mg total) by mouth daily. Take 1 a day (Patient taking differently: Take 1 tablet by mouth daily.) 28 tablet 11  . phentermine (ADIPEX-P) 37.5 MG tablet Take 37.5 mg by mouth daily before breakfast.    . polyethylene glycol-electrolytes (TRILYTE) 420 g solution Take 4,000 mLs by mouth as directed. 4000 mL 0  . triamterene-hydrochlorothiazide (MAXZIDE) 75-50 MG tablet Take 1 tablet by mouth daily. 30 tablet 5  . benzonatate (TESSALON PERLES) 100 MG capsule Take 1 capsule (100 mg total) by mouth 3 (three) times daily as needed for cough. (Patient not taking: No sig reported) 30 capsule 0    Results for orders placed or performed during the hospital encounter of 06/12/20 (from the past 48 hour(s))  SARS CORONAVIRUS 2 (TAT 6-24 HRS) Nasopharyngeal Nasopharyngeal Swab     Status: None   Collection Time: 06/12/20  2:26 PM   Specimen: Nasopharyngeal Swab  Result Value Ref Range   SARS Coronavirus 2 NEGATIVE NEGATIVE    Comment: (NOTE) SARS-CoV-2 target nucleic acids are NOT DETECTED.  The SARS-CoV-2 RNA is generally detectable in upper and lower respiratory specimens during the acute phase  of infection. Negative results do not preclude SARS-CoV-2 infection, do not rule out co-infections with other pathogens, and should not be used as the sole basis for treatment or other patient management decisions. Negative results must be combined with clinical observations, patient history, and epidemiological information. The expected result is Negative.  Fact Sheet for Patients: SugarRoll.be  Fact Sheet for Healthcare Providers: https://www.woods-mathews.com/  This test is not yet  approved or cleared by the [place] FDA and  has been authorized for detection and/or diagnosis of SARS-CoV-2 by FDA under an Emergency Use Authorization (EUA). This EUA will remain  in effect (meaning this test can be used) for the duration of the COVID-19 declaration under Se ction 564(b)(1) of the Act, 21 U.S.C. section 360bbb-3(b)(1), unless the authorization is terminated or revoked sooner.  Performed at [facility], [place] [address], [place], [place] [address]   CBC with Differential/Platelet     Status: Abnormal   Collection Time: 06/12/20  2:35 PM  Result Value Ref Range   WBC 9.3 4.0 - 10.5 K/uL   RBC 4.32 3.87 - 5.11 MIL/uL   Hemoglobin 11.2 (L) 12.0 - 15.0 g/dL   HCT 37.0 36.0 - 46.0 %   MCV 85.6 80.0 - 100.0 fL   MCH 25.9 (L) 26.0 - 34.0 pg   MCHC 30.3 30.0 - 36.0 g/dL   RDW 13.9 11.5 - 15.5 %   Platelets 533 (H) 150 - 400 K/uL   nRBC 0.0 0.0 - 0.2 %   Neutrophils Relative % 65 %   Neutro Abs 6.0 1.7 - 7.7 K/uL   Lymphocytes Relative 28 %   Lymphs Abs 2.6 0.7 - 4.0 K/uL   Monocytes Relative 6 %   Monocytes Absolute 0.5 0.1 - 1.0 K/uL   Eosinophils Relative 1 %   Eosinophils Absolute 0.1 0.0 - 0.5 K/uL   Basophils Relative 0 %   Basophils Absolute 0.0 0.0 - 0.1 K/uL   Immature Granulocytes 0 %   Abs Immature Granulocytes 0.03 0.00 - 0.07 K/uL    Comment: Performed at [facility], [address] [place], [place] [address]  Basic metabolic panel     Status: Abnormal   Collection Time: 06/12/20  2:35 PM  Result Value Ref Range   Sodium 135 135 - 145 mmol/L   Potassium 3.5 3.5 - 5.1 mmol/L   Chloride 99 98 - 111 mmol/L   CO2 27 22 - 32 mmol/L   Glucose, Bld 110 (H) 70 - 99 mg/dL    Comment: Glucose reference range applies only to samples taken after fasting for at least 8 hours.   BUN 11 6 - 20 mg/dL   Creatinine, Ser 0.83 0.44 - 1.00 mg/dL   Calcium 9.7 8.9 - 10.3 mg/dL   GFR, Estimated >60 >60 mL/min    Comment: (NOTE) Calculated using the  CKD-EPI Creatinine Equation (2021)    Anion gap 9 5 - 15    Comment: Performed at [facility], [address], [place], [place] [address]  hCG, serum, qualitative (Not at [facility])     Status: None   Collection Time: 06/12/20  2:35 PM  Result Value Ref Range   Preg, Serum NEGATIVE NEGATIVE    Comment:        THE SENSITIVITY OF THIS METHODOLOGY IS >10 mIU/mL. Performed at [facility], [address] [place], [place] [address]    No results found.  Review of Systems  Constitutional: Negative.   HENT: Negative.   Eyes:  Negative.   Respiratory: Negative.   Cardiovascular: Negative.   Gastrointestinal: Negative.   Endocrine: Negative.   Genitourinary: Negative.   Musculoskeletal: Negative.   Skin: Negative.   Allergic/Immunologic: Negative.   Neurological: Negative.   Hematological: Negative.   Psychiatric/Behavioral: Negative.     Blood pressure 122/60, pulse 95, temperature 98.8 F (37.1 C), temperature source Oral, resp. rate 15, height 5\' 4"  (1.626 m), weight 120.7 kg, last menstrual period 06/12/2020, SpO2 100 %. Physical Exam  GENERAL: The patient is AO x3, in no acute distress. HEENT: Head is normocephalic and atraumatic. EOMI are intact. Mouth is well hydrated and without lesions. NECK: Supple. No masses LUNGS: Clear to auscultation. No presence of rhonchi/wheezing/rales. Adequate chest expansion HEART: RRR, normal s1 and s2. ABDOMEN: Soft, nontender, no guarding, no peritoneal signs, and nondistended. BS +. No masses. EXTREMITIES: Without any cyanosis, clubbing, rash, lesions or edema. NEUROLOGIC: AOx3, no focal motor deficit. SKIN: no jaundice, no rashes  Assessment/Plan LEIANNA BARGA is a 45 y.o. female with past medical history of hypertension, iron deficiency anemia and obesity, who comes to the hospital for evaluation of iron deficiency anemia.  We will proceed with an EGD and colonoscopy.  Harvel Quale, MD 06/14/2020, 8:16 AM

## 2020-06-14 NOTE — Op Note (Signed)
[facility] Patient Name: Rose Mcguire Procedure Date: 06/14/2020 8:41 AM MRN: [id number] Date of Birth: 14-Aug-1975 Attending MD: Maylon Peppers ,  CSN: [id number] Age: 45 Admit Type: Outpatient Procedure:                Colonoscopy Indications:              Iron deficiency anemia Providers:                Maylon Peppers, Crystal Page, Randa Spike,                            Technician Referring MD:              Medicines:                Monitored Anesthesia Care Complications:            No immediate complications. Estimated Blood Loss:     Estimated blood loss: none. Procedure:                Pre-Anesthesia Assessment:                           - Prior to the procedure, a History and Physical                            was performed, and patient medications, allergies                            and sensitivities were reviewed. The patient's                            tolerance of previous anesthesia was reviewed.                           - The risks and benefits of the procedure and the                            sedation options and risks were discussed with the                            patient. All questions were answered and informed                            consent was obtained.                           - ASA Grade Assessment: II - A patient with mild                            systemic disease.                           After obtaining informed consent, the colonoscope                            was passed under direct vision. Throughout the  procedure, the patient's blood pressure, pulse, and                            oxygen saturations were monitored continuously. The                            [id number]-[id number] ([id number]) scope was introduced through                            the anus and advanced to the 10 cm into the ileum.                            The colonoscopy was performed without difficulty.                            The  patient tolerated the procedure well. The                            quality of the bowel preparation was adequate to                            identify polyps. Scope withdrawal time was 17                            minutes. Scope In: 8:45:44 AM Scope Out: 9:10:41 AM Scope Withdrawal Time: 0 hours 17 minutes 46 seconds  Total Procedure Duration: 0 hours 24 minutes 57 seconds  Findings:      The perianal and digital rectal examinations were normal.      The terminal ileum appeared normal.      A few medium-mouthed diverticula were found in the sigmoid colon.      Non-bleeding internal hemorrhoids were found during retroflexion. The       hemorrhoids were medium-sized. Impression:               - The examined portion of the ileum was normal.                           - Diverticulosis in the sigmoid colon.                           - Non-bleeding internal hemorrhoids.                           - No specimens collected. Moderate Sedation:      Per Anesthesia Care Recommendation:           - Discharge patient to home (ambulatory).                           - Resume previous diet.                           - Repeat colonoscopy in 10 years for screening                            purposes.                           -  Consider capsule endoscopy to evaluate iron                            deficiency anemia. Procedure Code(s):        --- Professional ---                           [phone], Colonoscopy, flexible; diagnostic, including                            collection of specimen(s) by brushing or washing,                            when performed (separate procedure) Diagnosis Code(s):        --- Professional ---                           K64.8, Other hemorrhoids                           D50.9, Iron deficiency anemia, unspecified                           K57.30, Diverticulosis of large intestine without                            perforation or abscess without bleeding CPT copyright  2019 American Medical Association. All rights reserved. The codes documented in this report are preliminary and upon coder review may  be revised to meet current compliance requirements. Maylon Peppers, MD Maylon Peppers,  06/14/2020 9:16:38 AM This report has been signed electronically. Number of Addenda: 0

## 2020-06-14 NOTE — Transfer of Care (Signed)
Immediate Anesthesia Transfer of Care Note  Patient: Rose Mcguire  Procedure(s) Performed: COLONOSCOPY WITH PROPOFOL (N/A ) ESOPHAGOGASTRODUODENOSCOPY (EGD) WITH PROPOFOL (N/A ) BIOPSY  Patient Location: Short Stay  Anesthesia Type:General  Level of Consciousness: awake, alert  and oriented  Airway & Oxygen Therapy: Patient Spontanous Breathing  Post-op Assessment: Report given to RN, Post -op Vital signs reviewed and stable and Patient moving all extremities X 4  Post vital signs: Reviewed and stable  Last Vitals:  Vitals Value Taken Time  BP    Temp    Pulse    Resp    SpO2      Last Pain:  Vitals:   06/14/20 0824  TempSrc:   PainSc: 0-No pain         Complications: No complications documented.

## 2020-06-14 NOTE — Discharge Instructions (Signed)
You are being discharged to home.  Resume your previous diet.  We are waiting for your pathology results.  Continue oral iron. Your physician has recommended a repeat colonoscopy in 10 years for screening purposes.  Consider capsule endoscopy to evaluate iron deficiency anemia.     Upper Endoscopy, Adult, Care After This sheet gives you information about how to care for yourself after your procedure. Your health care provider may also give you more specific instructions. If you have problems or questions, contact your health care provider. What can I expect after the procedure? After the procedure, it is common to have:  A sore throat.  Mild stomach pain or discomfort.  Bloating.  Nausea. Follow these instructions at home:  Follow instructions from your health care provider about what to eat or drink after your procedure.  Return to your normal activities as told by your health care provider. Ask your health care provider what activities are safe for you.  Take over-the-counter and prescription medicines only as told by your health care provider.  If you were given a sedative during the procedure, it can affect you for several hours. Do not drive or operate machinery until your health care provider says that it is safe.  Keep all follow-up visits as told by your health care provider. This is important.   Contact a health care provider if you have:  A sore throat that lasts longer than one day.  Trouble swallowing. Get help right away if:  You vomit blood or your vomit looks like coffee grounds.  You have: ? A fever. ? Bloody, black, or tarry stools. ? A severe sore throat or you cannot swallow. ? Difficulty breathing. ? Severe pain in your chest or abdomen. Summary  After the procedure, it is common to have a sore throat, mild stomach discomfort, bloating, and nausea.  If you were given a sedative during the procedure, it can affect you for several hours. Do not  drive or operate machinery until your health care provider says that it is safe.  Follow instructions from your health care provider about what to eat or drink after your procedure.  Return to your normal activities as told by your health care provider. This information is not intended to replace advice given to you by your health care provider. Make sure you discuss any questions you have with your health care provider. Document Revised: 04/18/2019 Document Reviewed: 09/20/2017 Elsevier Patient Education  2021 [place].     Colonoscopy, Adult, Care After This sheet gives you information about how to care for yourself after your procedure. Your doctor may also give you more specific instructions. If you have problems or questions, call your doctor. What can I expect after the procedure? After the procedure, it is common to have:  A small amount of blood in your poop (stool) for 24 hours.  Some gas.  Mild cramping or bloating in your belly (abdomen). Follow these instructions at home: Eating and drinking  Drink enough fluid to keep your pee (urine) pale yellow.  Follow instructions from your doctor about what you cannot eat or drink.  Return to your normal diet as told by your doctor. Avoid heavy or fried foods that are hard to digest.   Activity  Rest as told by your doctor.  Do not sit for a long time without moving. Get up to take short walks every 1-2 hours. This is important. Ask for help if you feel weak or unsteady.  Return to  your normal activities as told by your doctor. Ask your doctor what activities are safe for you. To help cramping and bloating:  Try walking around.  Put heat on your belly as told by your doctor. Use the heat source that your doctor recommends, such as a moist heat pack or a heating pad. ? Put a towel between your skin and the heat source. ? Leave the heat on for 20-30 minutes. ? Remove the heat if your skin turns bright red. This is very  important if you are unable to feel pain, heat, or cold. You may have a greater risk of getting burned.   General instructions  If you were given a medicine to help you relax (sedative) during your procedure, it can affect you for many hours. Do not drive or use machinery until your doctor says that it is safe.  For the first 24 hours after the procedure: ? Do not sign important documents. ? Do not drink alcohol. ? Do your daily activities more slowly than normal. ? Eat foods that are soft and easy to digest.  Take over-the-counter or prescription medicines only as told by your doctor.  Keep all follow-up visits as told by your doctor. This is important. Contact a doctor if:  You have blood in your poop 2-3 days after the procedure. Get help right away if:  You have more than a small amount of blood in your poop.  You see large clumps of tissue (blood clots) in your poop.  Your belly is swollen.  You feel like you may vomit (nauseous).  You vomit.  You have a fever.  You have belly pain that gets worse, and medicine does not help your pain. Summary  After the procedure, it is common to have a small amount of blood in your poop. You may also have mild cramping and bloating in your belly.  If you were given a medicine to help you relax (sedative) during your procedure, it can affect you for many hours. Do not drive or use machinery until your doctor says that it is safe.  Get help right away if you have a lot of blood in your poop, feel like you may vomit, have a fever, or have more belly pain. This information is not intended to replace advice given to you by your health care provider. Make sure you discuss any questions you have with your health care provider. Document Revised: 02/24/2019 Document Reviewed: 11/14/2018 Elsevier Patient Education  2021 [place] After This sheet gives you information about how to care for  yourself after your procedure. Your health care provider may also give you more specific instructions. If you have problems or questions, contact your health care provider. What can I expect after the procedure? After the procedure, it is common to have:  Tiredness.  Forgetfulness about what happened after the procedure.  Impaired judgment for important decisions.  Nausea or vomiting.  Some difficulty with balance. Follow these instructions at home: For the time period you were told by your health care provider:  Rest as needed.  Do not participate in activities where you could fall or become injured.  Do not drive or use machinery.  Do not drink alcohol.  Do not take sleeping pills or medicines that cause drowsiness.  Do not make important decisions or sign legal documents.  Do not take care of children on your own.      Eating and  drinking  Follow the diet that is recommended by your health care provider.  Drink enough fluid to keep your urine pale yellow.  If you vomit: ? Drink water, juice, or soup when you can drink without vomiting. ? Make sure you have little or no nausea before eating solid foods. General instructions  Have a responsible adult stay with you for the time you are told. It is important to have someone help care for you until you are awake and alert.  Take over-the-counter and prescription medicines only as told by your health care provider.  If you have sleep apnea, surgery and certain medicines can increase your risk for breathing problems. Follow instructions from your health care provider about wearing your sleep device: ? Anytime you are sleeping, including during daytime naps. ? While taking prescription pain medicines, sleeping medicines, or medicines that make you drowsy.  Avoid smoking.  Keep all follow-up visits as told by your health care provider. This is important. Contact a health care provider if:  You keep feeling nauseous or  you keep vomiting.  You feel light-headed.  You are still sleepy or having trouble with balance after 24 hours.  You develop a rash.  You have a fever.  You have redness or swelling around the IV site. Get help right away if:  You have trouble breathing.  You have new-onset confusion at home. Summary  For several hours after your procedure, you may feel tired. You may also be forgetful and have poor judgment.  Have a responsible adult stay with you for the time you are told. It is important to have someone help care for you until you are awake and alert.  Rest as told. Do not drive or operate machinery. Do not drink alcohol or take sleeping pills.  Get help right away if you have trouble breathing, or if you suddenly become confused. This information is not intended to replace advice given to you by your health care provider. Make sure you discuss any questions you have with your health care provider. Document Revised: 01/04/2020 Document Reviewed: 03/23/2019 Elsevier Patient Education  2021 [place].     Hemorrhoids Hemorrhoids are swollen veins that may develop:  In the butt (rectum). These are called internal hemorrhoids.  Around the opening of the butt (anus). These are called external hemorrhoids. Hemorrhoids can cause pain, itching, or bleeding. Most of the time, they do not cause serious problems. They usually get better with diet changes, lifestyle changes, and other home treatments. What are the causes? This condition may be caused by:  Having trouble pooping (constipation).  Pushing hard (straining) to poop.  Watery poop (diarrhea).  Pregnancy.  Being very overweight (obese).  Sitting for long periods of time.  Heavy lifting or other activity that causes you to strain.  Anal sex.  Riding a bike for a long period of time. What are the signs or symptoms? Symptoms of this condition include:  Pain.  Itching or soreness in the  butt.  Bleeding from the butt.  Leaking poop.  Swelling in the area.  One or more lumps around the opening of your butt. How is this diagnosed? A doctor can often diagnose this condition by looking at the affected area. The doctor may also:  Do an exam that involves feeling the area with a gloved hand (digital rectal exam).  Examine the area inside your butt using a small tube (anoscope).  Order blood tests. This may be done if you have lost a  lot of blood.  Have you get a test that involves looking inside the colon using a flexible tube with a camera on the end (sigmoidoscopy or colonoscopy). How is this treated? This condition can usually be treated at home. Your doctor may tell you to change what you eat, make lifestyle changes, or try home treatments. If these do not help, procedures can be done to remove the hemorrhoids or make them smaller. These may involve:  Placing rubber bands at the base of the hemorrhoids to cut off their blood supply.  Injecting medicine into the hemorrhoids to shrink them.  Shining a type of light energy onto the hemorrhoids to cause them to fall off.  Doing surgery to remove the hemorrhoids or cut off their blood supply. Follow these instructions at home: Eating and drinking  Eat foods that have a lot of fiber in them. These include whole grains, beans, nuts, fruits, and vegetables.  Ask your doctor about taking products that have added fiber (fibersupplements).  Reduce the amount of fat in your diet. You can do this by: ? Eating low-fat dairy products. ? Eating less red meat. ? Avoiding processed foods.  Drink enough fluid to keep your pee (urine) pale yellow.   Managing pain and swelling  Take a warm-water bath (sitz bath) for 20 minutes to ease pain. Do this 3-4 times a day. You may do this in a bathtub or using a portable sitz bath that fits over the toilet.  If told, put ice on the painful area. It may be helpful to use ice between  your warm baths. ? Put ice in a plastic bag. ? Place a towel between your skin and the bag. ? Leave the ice on for 20 minutes, 2-3 times a day.   General instructions  Take over-the-counter and prescription medicines only as told by your doctor. ? Medicated creams and medicines may be used as told.  Exercise often. Ask your doctor how much and what kind of exercise is best for you.  Go to the bathroom when you have the urge to poop. Do not wait.  Avoid pushing too hard when you poop.  Keep your butt dry and clean. Use wet toilet paper or moist towelettes after pooping.  Do not sit on the toilet for a long time.  Keep all follow-up visits as told by your doctor. This is important. Contact a doctor if you:  Have pain and swelling that do not get better with treatment or medicine.  Have trouble pooping.  Cannot poop.  Have pain or swelling outside the area of the hemorrhoids. Get help right away if you have:  Bleeding that will not stop. Summary  Hemorrhoids are swollen veins in the butt or around the opening of the butt.  They can cause pain, itching, or bleeding.  Eat foods that have a lot of fiber in them. These include whole grains, beans, nuts, fruits, and vegetables.  Take a warm-water bath (sitz bath) for 20 minutes to ease pain. Do this 3-4 times a day. This information is not intended to replace advice given to you by your health care provider. Make sure you discuss any questions you have with your health care provider. Document Revised: 04/28/2018 Document Reviewed: 09/09/2017 Elsevier Patient Education  [place].

## 2020-06-14 NOTE — Anesthesia Preprocedure Evaluation (Addendum)
Anesthesia Evaluation  Patient identified by MRN, date of birth, ID band Patient awake    Reviewed: Allergy & Precautions, H&P , NPO status , Patient's Chart, lab work & pertinent test results, reviewed documented beta blocker date and time   Airway Mallampati: II  TM Distance: >3 FB Neck ROM: full    Dental no notable dental hx. (+) Teeth Intact, Dental Advisory Given   Pulmonary neg pulmonary ROS,    Pulmonary exam normal breath sounds clear to auscultation       Cardiovascular Exercise Tolerance: Good hypertension, negative cardio ROS   Rhythm:regular Rate:Normal     Neuro/Psych negative neurological ROS  negative psych ROS   GI/Hepatic negative GI ROS, Neg liver ROS,   Endo/Other  Morbid obesity  Renal/GU negative Renal ROS  negative genitourinary   Musculoskeletal   Abdominal   Peds  Hematology  (+) Blood dyscrasia, anemia ,   Anesthesia Other Findings   Reproductive/Obstetrics negative OB ROS                            Anesthesia Physical Anesthesia Plan  ASA: III  Anesthesia Plan: General   Post-op Pain Management:    Induction:   PONV Risk Score and Plan: Propofol infusion  Airway Management Planned:   Additional Equipment:   Intra-op Plan:   Post-operative Plan:   Informed Consent: I have reviewed the patients History and Physical, chart, labs and discussed the procedure including the risks, benefits and alternatives for the proposed anesthesia with the patient or authorized representative who has indicated his/her understanding and acceptance.     Dental Advisory Given  Plan Discussed with: CRNA  Anesthesia Plan Comments:         Anesthesia Quick Evaluation

## 2020-06-14 NOTE — Op Note (Signed)
[facility] Patient Name: Rose Mcguire Procedure Date: 06/14/2020 8:13 AM MRN: [id number] Date of Birth: 1976-05-01 Attending MD: Maylon Peppers ,  CSN: [id number] Age: 45 Admit Type: Outpatient Procedure:                Upper GI endoscopy Indications:              Iron deficiency anemia Providers:                Maylon Peppers, Crystal Page, Randa Spike,                            Technician Referring MD:              Medicines:                Monitored Anesthesia Care Complications:            No immediate complications. Estimated Blood Loss:     Estimated blood loss: none. Procedure:                Pre-Anesthesia Assessment:                           - Prior to the procedure, a History and Physical                            was performed, and patient medications, allergies                            and sensitivities were reviewed. The patient's                            tolerance of previous anesthesia was reviewed.                           - The risks and benefits of the procedure and the                            sedation options and risks were discussed with the                            patient. All questions were answered and informed                            consent was obtained.                           - ASA Grade Assessment: II - A patient with mild                            systemic disease.                           After obtaining informed consent, the endoscope was                            passed under direct vision. Throughout the  procedure, the patient's blood pressure, pulse, and                            oxygen saturations were monitored continuously. The                            Endoscope was introduced through the mouth, and                            advanced to the second part of duodenum. The upper                            GI endoscopy was accomplished without difficulty.                             The patient tolerated the procedure well. Scope In: 8:30:53 AM Scope Out: 8:38:02 AM Total Procedure Duration: 0 hours 7 minutes 9 seconds  Findings:      The examined esophagus was normal.      A few small sessile polyps with no bleeding were found in the gastric       fundus and in the gastric body.      The examined duodenum was normal. Biopsies for histology were taken with       a cold forceps for evaluation of celiac disease. Impression:               - Normal esophagus.                           - A few gastric polyps.                           - Normal examined duodenum. Biopsied. Moderate Sedation:      Per Anesthesia Care Recommendation:           - Discharge patient to home (ambulatory).                           - Resume previous diet.                           - Await pathology results. Procedure Code(s):        --- Professional ---                           [phone], Esophagogastroduodenoscopy, flexible,                            transoral; with biopsy, single or multiple Diagnosis Code(s):        --- Professional ---                           K31.7, Polyp of stomach and duodenum                           D50.9, Iron deficiency anemia, unspecified CPT copyright 2019 American Medical Association. All rights reserved. The codes documented in this report are preliminary and  upon coder review may  be revised to meet current compliance requirements. Maylon Peppers, MD Maylon Peppers,  06/14/2020 8:44:43 AM This report has been signed electronically. Number of Addenda: 0

## 2020-06-17 LAB — SURGICAL PATHOLOGY

## 2020-06-19 ENCOUNTER — Encounter (HOSPITAL_COMMUNITY): Payer: Self-pay | Admitting: Gastroenterology

## 2020-06-24 ENCOUNTER — Other Ambulatory Visit: Payer: Self-pay

## 2020-06-24 DIAGNOSIS — E559 Vitamin D deficiency, unspecified: Secondary | ICD-10-CM

## 2020-06-24 DIAGNOSIS — R7303 Prediabetes: Secondary | ICD-10-CM

## 2020-06-24 DIAGNOSIS — I10 Essential (primary) hypertension: Secondary | ICD-10-CM

## 2020-06-24 DIAGNOSIS — Z1159 Encounter for screening for other viral diseases: Secondary | ICD-10-CM

## 2020-06-26 ENCOUNTER — Encounter: Payer: BC Managed Care – PPO | Admitting: Family Medicine

## 2020-07-01 ENCOUNTER — Ambulatory Visit (HOSPITAL_COMMUNITY): Payer: BC Managed Care – PPO

## 2020-07-11 ENCOUNTER — Telehealth: Payer: BC Managed Care – PPO | Admitting: Obstetrics & Gynecology

## 2020-07-15 ENCOUNTER — Other Ambulatory Visit (HOSPITAL_COMMUNITY): Payer: Self-pay

## 2020-07-15 DIAGNOSIS — D75839 Thrombocytosis, unspecified: Secondary | ICD-10-CM

## 2020-07-16 ENCOUNTER — Inpatient Hospital Stay (HOSPITAL_COMMUNITY): Payer: BC Managed Care – PPO | Attending: Hematology

## 2020-07-16 ENCOUNTER — Other Ambulatory Visit: Payer: Self-pay

## 2020-07-16 DIAGNOSIS — R634 Abnormal weight loss: Secondary | ICD-10-CM | POA: Insufficient documentation

## 2020-07-16 DIAGNOSIS — D75839 Thrombocytosis, unspecified: Secondary | ICD-10-CM

## 2020-07-16 DIAGNOSIS — Z808 Family history of malignant neoplasm of other organs or systems: Secondary | ICD-10-CM | POA: Diagnosis not present

## 2020-07-16 DIAGNOSIS — N920 Excessive and frequent menstruation with regular cycle: Secondary | ICD-10-CM | POA: Diagnosis not present

## 2020-07-16 DIAGNOSIS — I10 Essential (primary) hypertension: Secondary | ICD-10-CM | POA: Diagnosis not present

## 2020-07-16 DIAGNOSIS — K5900 Constipation, unspecified: Secondary | ICD-10-CM | POA: Diagnosis not present

## 2020-07-16 DIAGNOSIS — K625 Hemorrhage of anus and rectum: Secondary | ICD-10-CM | POA: Insufficient documentation

## 2020-07-16 DIAGNOSIS — E669 Obesity, unspecified: Secondary | ICD-10-CM | POA: Insufficient documentation

## 2020-07-16 DIAGNOSIS — M7989 Other specified soft tissue disorders: Secondary | ICD-10-CM | POA: Diagnosis not present

## 2020-07-16 DIAGNOSIS — K317 Polyp of stomach and duodenum: Secondary | ICD-10-CM | POA: Diagnosis not present

## 2020-07-16 DIAGNOSIS — H538 Other visual disturbances: Secondary | ICD-10-CM | POA: Insufficient documentation

## 2020-07-16 DIAGNOSIS — Z800 Family history of malignant neoplasm of digestive organs: Secondary | ICD-10-CM | POA: Insufficient documentation

## 2020-07-16 DIAGNOSIS — Z79899 Other long term (current) drug therapy: Secondary | ICD-10-CM | POA: Insufficient documentation

## 2020-07-16 DIAGNOSIS — D500 Iron deficiency anemia secondary to blood loss (chronic): Secondary | ICD-10-CM | POA: Insufficient documentation

## 2020-07-16 DIAGNOSIS — Z833 Family history of diabetes mellitus: Secondary | ICD-10-CM | POA: Diagnosis not present

## 2020-07-16 DIAGNOSIS — Z8249 Family history of ischemic heart disease and other diseases of the circulatory system: Secondary | ICD-10-CM | POA: Diagnosis not present

## 2020-07-16 DIAGNOSIS — R519 Headache, unspecified: Secondary | ICD-10-CM | POA: Insufficient documentation

## 2020-07-16 LAB — COMPREHENSIVE METABOLIC PANEL
ALT: 11 U/L (ref 0–44)
AST: 12 U/L — ABNORMAL LOW (ref 15–41)
Albumin: 3.9 g/dL (ref 3.5–5.0)
Alkaline Phosphatase: 74 U/L (ref 38–126)
Anion gap: 13 (ref 5–15)
BUN: 11 mg/dL (ref 6–20)
CO2: 26 mmol/L (ref 22–32)
Calcium: 9.6 mg/dL (ref 8.9–10.3)
Chloride: 97 mmol/L — ABNORMAL LOW (ref 98–111)
Creatinine, Ser: 0.83 mg/dL (ref 0.44–1.00)
GFR, Estimated: >60 mL/min (ref >60)
Glucose, Bld: 106 mg/dL — ABNORMAL HIGH (ref 70–99)
Potassium: 3.4 mmol/L — ABNORMAL LOW (ref 3.5–5.1)
Sodium: 136 mmol/L (ref 135–145)
Total Bilirubin: 0.5 mg/dL (ref 0.3–1.2)
Total Protein: 8.7 g/dL — ABNORMAL HIGH (ref 6.5–8.1)

## 2020-07-16 LAB — CBC WITH DIFFERENTIAL/PLATELET
Abs Immature Granulocytes: 0.05 10*3/uL (ref 0.00–0.07)
Basophils Absolute: 0 10*3/uL (ref 0.0–0.1)
Basophils Relative: 1 %
Eosinophils Absolute: 0 10*3/uL (ref 0.0–0.5)
Eosinophils Relative: 0 %
HCT: 39.4 % (ref 36.0–46.0)
Hemoglobin: 12 g/dL (ref 12.0–15.0)
Immature Granulocytes: 1 %
Lymphocytes Relative: 32 %
Lymphs Abs: 2.8 10*3/uL (ref 0.7–4.0)
MCH: 26.1 pg (ref 26.0–34.0)
MCHC: 30.5 g/dL (ref 30.0–36.0)
MCV: 85.7 fL (ref 80.0–100.0)
Monocytes Absolute: 0.5 10*3/uL (ref 0.1–1.0)
Monocytes Relative: 6 %
Neutro Abs: 5.4 10*3/uL (ref 1.7–7.7)
Neutrophils Relative %: 60 %
Platelets: 500 10*3/uL — ABNORMAL HIGH (ref 150–400)
RBC: 4.6 MIL/uL (ref 3.87–5.11)
RDW: 13.7 % (ref 11.5–15.5)
WBC: 8.8 10*3/uL (ref 4.0–10.5)
nRBC: 0 % (ref 0.0–0.2)

## 2020-07-16 LAB — FERRITIN: Ferritin: 29 ng/mL (ref 11–307)

## 2020-07-16 LAB — IRON AND TIBC
Iron: 41 ug/dL (ref 28–170)
Saturation Ratios: 11 % (ref 10.4–31.8)
TIBC: 379 ug/dL (ref 250–450)
UIBC: 338 ug/dL

## 2020-07-22 ENCOUNTER — Other Ambulatory Visit: Payer: Self-pay

## 2020-07-22 ENCOUNTER — Ambulatory Visit (HOSPITAL_COMMUNITY)
Admission: RE | Admit: 2020-07-22 | Discharge: 2020-07-22 | Disposition: A | Discharge status: Home / Self Care | Payer: BC Managed Care – PPO | Source: Ambulatory Visit | Attending: Family Medicine | Admitting: Family Medicine

## 2020-07-22 DIAGNOSIS — Z1231 Encounter for screening mammogram for malignant neoplasm of breast: Secondary | ICD-10-CM

## 2020-07-23 ENCOUNTER — Inpatient Hospital Stay (HOSPITAL_COMMUNITY): Payer: BC Managed Care – PPO | Admitting: Oncology

## 2020-07-23 VITALS — BP 131/88 | HR 107 | Temp 97.3°F | Resp 18

## 2020-07-23 DIAGNOSIS — Z800 Family history of malignant neoplasm of digestive organs: Secondary | ICD-10-CM | POA: Diagnosis not present

## 2020-07-23 DIAGNOSIS — E669 Obesity, unspecified: Secondary | ICD-10-CM | POA: Diagnosis not present

## 2020-07-23 DIAGNOSIS — K317 Polyp of stomach and duodenum: Secondary | ICD-10-CM | POA: Diagnosis not present

## 2020-07-23 DIAGNOSIS — Z833 Family history of diabetes mellitus: Secondary | ICD-10-CM | POA: Diagnosis not present

## 2020-07-23 DIAGNOSIS — R519 Headache, unspecified: Secondary | ICD-10-CM | POA: Diagnosis not present

## 2020-07-23 DIAGNOSIS — H538 Other visual disturbances: Secondary | ICD-10-CM | POA: Diagnosis not present

## 2020-07-23 DIAGNOSIS — I10 Essential (primary) hypertension: Secondary | ICD-10-CM | POA: Diagnosis not present

## 2020-07-23 DIAGNOSIS — D509 Iron deficiency anemia, unspecified: Secondary | ICD-10-CM

## 2020-07-23 DIAGNOSIS — Z79899 Other long term (current) drug therapy: Secondary | ICD-10-CM | POA: Diagnosis not present

## 2020-07-23 DIAGNOSIS — D75839 Thrombocytosis, unspecified: Secondary | ICD-10-CM | POA: Diagnosis not present

## 2020-07-23 DIAGNOSIS — R634 Abnormal weight loss: Secondary | ICD-10-CM | POA: Diagnosis not present

## 2020-07-23 DIAGNOSIS — D500 Iron deficiency anemia secondary to blood loss (chronic): Secondary | ICD-10-CM | POA: Diagnosis not present

## 2020-07-23 DIAGNOSIS — K5900 Constipation, unspecified: Secondary | ICD-10-CM | POA: Diagnosis not present

## 2020-07-23 DIAGNOSIS — Z8249 Family history of ischemic heart disease and other diseases of the circulatory system: Secondary | ICD-10-CM | POA: Diagnosis not present

## 2020-07-23 DIAGNOSIS — K625 Hemorrhage of anus and rectum: Secondary | ICD-10-CM | POA: Diagnosis not present

## 2020-07-23 DIAGNOSIS — N920 Excessive and frequent menstruation with regular cycle: Secondary | ICD-10-CM | POA: Diagnosis not present

## 2020-07-23 DIAGNOSIS — M7989 Other specified soft tissue disorders: Secondary | ICD-10-CM | POA: Diagnosis not present

## 2020-07-23 NOTE — Progress Notes (Signed)
Rose Mcguire, [place] [address]   CLINIC:  Medical Oncology/Hematology  Patient Care Team: Fayrene Helper, MD as PCP - General  CHIEF COMPLAINTS/PURPOSE OF CONSULTATION:  Evaluation of thrombocytosis  HISTORY OF PRESENTING ILLNESS:  Rose Mcguire 45 y.o. female is here because of thrombocytosis, at the request of Dr. Tula Nakayama. She has had elevated platelet count since at least 2008.  She was last seen in clinic on 04/09/2020.  In the interim she has done well.  She had a colonoscopy and EGD on 06/14/2020 which showed normal esophagus and few gastric polyps.  Colonoscopy showed diverticulosis and nonbleeding internal hemorrhoids.  No specimens were collected.  She continues to take iron supplements and does have occasional constipation.  She is taking OTC stool softeners with relief.  She is followed by Dr. Elonda Husky for heavy menstrual cycles and is currently on Micronor.  She continues to take phentermine for weight loss.  Weight is stable. She denies changes in skin color of her fingers, itching after hot shower, headaches, leg swelling, rashes, vision changes or blurry vision. She denies ever being diagnosed with lupus or RA. She has never had a platelet or blood transfusion. She is not currently taking ASA 81, antibiotics or steroids and denies having recurrent infections.   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 She is a non-smoker. Her maternal aunt had pancreatic cancer; maternal uncle had throat cancer; maternal cousin had colon cancer. She denies presence of hemochromatosis or thrombocytosis in her family.  Today she reports feeling well.  She is taking her iron supplements as prescribed.  MEDICAL HISTORY:  Past Medical History:  Diagnosis Date  . Allergy   . Anemia   . Hypertension   . Hypertensive retinopathy    OU  . Obesity     SURGICAL HISTORY: Past Surgical History:  Procedure Laterality Date  .  BIOPSY  06/14/2020   Procedure: BIOPSY;  Surgeon: Harvel Quale, MD;  [facility];  Service: Gastroenterology;;  . COLONOSCOPY WITH PROPOFOL N/A 06/14/2020   Procedure: COLONOSCOPY WITH PROPOFOL;  Surgeon: Harvel Quale, MD;  [facility];  Service: Gastroenterology;  Laterality: N/A;  8:15  . ESOPHAGOGASTRODUODENOSCOPY (EGD) WITH PROPOFOL N/A 06/14/2020   Procedure: ESOPHAGOGASTRODUODENOSCOPY (EGD) WITH PROPOFOL;  Surgeon: Harvel Quale, MD;  [facility];  Service: Gastroenterology;  Laterality: N/A;    SOCIAL HISTORY: Social History   Socioeconomic History  . Marital status: Soil scientist    Spouse name: Not on file  . Number of children: 2  . Years of education: Not on file  . Highest education level: Not on file  Occupational History  . Occupation: CNA     Employer: [employer]  Tobacco Use  . Smoking status: Never Smoker  . Smokeless tobacco: Never Used  Vaping Use  . Vaping Use: Never used  Substance and Sexual Activity  . Alcohol use: Yes    Comment: occasionally  . Drug use: No  . Sexual activity: Yes    Birth control/protection: None  Other Topics Concern  . Not on file  Social History Narrative  . Not on file   Social Determinants of Health   Financial Resource Strain: Low Risk   . Difficulty of Paying Living Expenses: Not hard at all  Food Insecurity: No Food Insecurity  . Worried About Charity fundraiser in the Last Year: Never true  . Ran Out of  Food in the Last Year: Never true  Transportation Needs: No Transportation Needs  . Lack of Transportation (Medical): No  . Lack of Transportation (Non-Medical): No  Physical Activity: Sufficiently Active  . Days of Exercise per Week: 5 days  . Minutes of Exercise per Session: 30 min  Stress: No Stress Concern Present  . Feeling of Stress : Not at all  Social Connections: Moderately Integrated  . Frequency of Communication with Friends and  Family: More than three times a week  . Frequency of Social Gatherings with Friends and Family: More than three times a week  . Attends Religious Services: More than 4 times per year  . Active Member of Clubs or Organizations: No  . Attends Archivist Meetings: Never  . Marital Status: Living with partner  Intimate Partner Violence: Not At Risk  . Fear of Current or Ex-Partner: No  . Emotionally Abused: No  . Physically Abused: No  . Sexually Abused: No    FAMILY HISTORY: Family History  Problem Relation Age of Onset  . Hypertension Mother   . Diabetes Father   . Hypertension Father   . Colon cancer Maternal Aunt   . Colon cancer Cousin     ALLERGIES:  has No Known Allergies.  MEDICATIONS:  Current Outpatient Medications  Medication Sig Dispense Refill  . benzonatate (TESSALON PERLES) 100 MG capsule Take 1 capsule (100 mg total) by mouth 3 (three) times daily as needed for cough. 30 capsule 0  . docusate sodium (COLACE) 100 MG capsule Take 100 mg by mouth daily as needed for mild constipation.    . ergocalciferol (VITAMIN D2) 1.25 MG (50000 UT) capsule Take 1 capsule (50,000 Units total) by mouth once a week. One capsule once weekly (Patient taking differently: Take 50,000 Units by mouth every Monday.) 12 capsule 1  . ferrous sulfate 325 (65 FE) MG tablet Take 1 tablet (325 mg total) by mouth daily with breakfast. 90 tablet 1  . norethindrone (MICRONOR) 0.35 MG tablet Take 1 tablet (0.35 mg total) by mouth daily. Take 1 a day (Patient taking differently: Take 1 tablet by mouth daily.) 28 tablet 11  . phentermine (ADIPEX-P) 37.5 MG tablet Take 37.5 mg by mouth daily before breakfast.    . polyethylene glycol-electrolytes (TRILYTE) 420 g solution Take 4,000 mLs by mouth as directed. 4000 mL 0  . triamterene-hydrochlorothiazide (MAXZIDE) 75-50 MG tablet Take 1 tablet by mouth daily. 30 tablet 5   No current facility-administered medications for this visit.    REVIEW OF  SYSTEMS:   Review of Systems  Constitutional: Negative for appetite change, fatigue and unexpected weight change.  Eyes: Negative for eye problems.  Cardiovascular: Negative for leg swelling.  Gastrointestinal: Positive for constipation.  Skin: Negative for itching and rash.  Neurological: Negative for headaches.  All other systems reviewed and are negative.    PHYSICAL EXAMINATION: ECOG PERFORMANCE STATUS: 0 - Asymptomatic  Vitals:   07/23/20 1128  BP: 131/88  Pulse: (!) 107  Resp: 18  Temp: (!) 97.3 F (36.3 C)  SpO2: 99%   There were no vitals filed for this visit. Physical Exam Vitals reviewed.  Constitutional:      Appearance: Normal appearance. She is obese.  Cardiovascular:     Rate and Rhythm: Normal rate and regular rhythm.     Pulses: Normal pulses.     Heart sounds: Normal heart sounds.  Pulmonary:     Effort: Pulmonary effort is normal.     Breath sounds:  Normal breath sounds.  Chest:  Breasts:     Right: No axillary adenopathy or supraclavicular adenopathy.     Left: No axillary adenopathy or supraclavicular adenopathy.    Abdominal:     Palpations: Abdomen is soft. There is no hepatomegaly, splenomegaly or mass.     Tenderness: There is no abdominal tenderness.     Hernia: No hernia is present.  Musculoskeletal:     Right lower leg: No edema.     Left lower leg: No edema.  Lymphadenopathy:     Cervical: No cervical adenopathy.     Upper Body:     Right upper body: No supraclavicular, axillary or pectoral adenopathy.     Left upper body: No supraclavicular, axillary or pectoral adenopathy.  Neurological:     General: No focal deficit present.     Mental Status: She is alert and oriented to person, place, and time.  Psychiatric:        Mood and Affect: Mood normal.        Behavior: Behavior normal.      LABORATORY DATA:  I have reviewed the data as listed Recent Results (from the past 2160 hour(s))  SARS CORONAVIRUS 2 (TAT 6-24 HRS)  Nasopharyngeal Nasopharyngeal Swab     Status: None   Collection Time: 06/12/20  2:26 PM   Specimen: Nasopharyngeal Swab  Result Value Ref Range   SARS Coronavirus 2 NEGATIVE NEGATIVE    Comment: (NOTE) SARS-CoV-2 target nucleic acids are NOT DETECTED.  The SARS-CoV-2 RNA is generally detectable in upper and lower respiratory specimens during the acute phase of infection. Negative results do not preclude SARS-CoV-2 infection, do not rule out co-infections with other pathogens, and should not be used as the sole basis for treatment or other patient management decisions. Negative results must be combined with clinical observations, patient history, and epidemiological information. The expected result is Negative.  Fact Sheet for Patients: SugarRoll.be  Fact Sheet for Healthcare Providers: https://www.woods-mathews.com/  This test is not yet approved or cleared by the [place] FDA and  has been authorized for detection and/or diagnosis of SARS-CoV-2 by FDA under an Emergency Use Authorization (EUA). This EUA will remain  in effect (meaning this test can be used) for the duration of the COVID-19 declaration under Se ction 564(b)(1) of the Act, 21 U.S.C. section 360bbb-3(b)(1), unless the authorization is terminated or revoked sooner.  Performed at [facility], [place] [address], [place], [place] [address]   CBC with Differential/Platelet     Status: Abnormal   Collection Time: 06/12/20  2:35 PM  Result Value Ref Range   WBC 9.3 4.0 - 10.5 K/uL   RBC 4.32 3.87 - 5.11 MIL/uL   Hemoglobin 11.2 (L) 12.0 - 15.0 g/dL   HCT 37.0 36.0 - 46.0 %   MCV 85.6 80.0 - 100.0 fL   MCH 25.9 (L) 26.0 - 34.0 pg   MCHC 30.3 30.0 - 36.0 g/dL   RDW 13.9 11.5 - 15.5 %   Platelets 533 (H) 150 - 400 K/uL   nRBC 0.0 0.0 - 0.2 %   Neutrophils Relative % 65 %   Neutro Abs 6.0 1.7 - 7.7 K/uL   Lymphocytes Relative 28 %   Lymphs Abs 2.6 0.7 - 4.0 K/uL    Monocytes Relative 6 %   Monocytes Absolute 0.5 0.1 - 1.0 K/uL   Eosinophils Relative 1 %   Eosinophils Absolute 0.1 0.0 - 0.5 K/uL   Basophils Relative 0 %  Basophils Absolute 0.0 0.0 - 0.1 K/uL   Immature Granulocytes 0 %   Abs Immature Granulocytes 0.03 0.00 - 0.07 K/uL    Comment: Performed at [facility], [address], [place], [place] [address]  Basic metabolic panel     Status: Abnormal   Collection Time: 06/12/20  2:35 PM  Result Value Ref Range   Sodium 135 135 - 145 mmol/L   Potassium 3.5 3.5 - 5.1 mmol/L   Chloride 99 98 - 111 mmol/L   CO2 27 22 - 32 mmol/L   Glucose, Bld 110 (H) 70 - 99 mg/dL    Comment: Glucose reference range applies only to samples taken after fasting for at least 8 hours.   BUN 11 6 - 20 mg/dL   Creatinine, Ser 0.83 0.44 - 1.00 mg/dL   Calcium 9.7 8.9 - 10.3 mg/dL   GFR, Estimated >60 >60 mL/min    Comment: (NOTE) Calculated using the CKD-EPI Creatinine Equation (2021)    Anion gap 9 5 - 15    Comment: Performed at [facility], [address] [place], [place] [address]  hCG, serum, qualitative (Not at [facility])     Status: None   Collection Time: 06/12/20  2:35 PM  Result Value Ref Range   Preg, Serum NEGATIVE NEGATIVE    Comment:        THE SENSITIVITY OF THIS METHODOLOGY IS >10 mIU/mL. Performed at [facility], [address] [place], [place] [address]   Surgical pathology     Status: None   Collection Time: 06/14/20  8:35 AM  Result Value Ref Range   SURGICAL PATHOLOGY      SURGICAL PATHOLOGY CASE: [id number] PATIENT: Carmelia Roller Surgical Pathology Report     Clinical History: Iron deficiency anemia, rectal bleeding     FINAL MICROSCOPIC DIAGNOSIS:  A. SMALL BOWEL, BIOPSY: - Duodenal mucosa with no significant pathologic findings. - Negative for increased intraepithelial lymphocytes and villous architectural changes.  GROSS DESCRIPTION:  Received in formalin are tan, soft tissue fragments that are  submitted in toto. Number: 4 Size: 0.15 to 0.3 cm Blocks: 1  SW 06/14/2020   Final Diagnosis performed by Gillie Manners, MD.   Electronically signed 06/17/2020 Technical component performed at [facility], [place] [address], [place], [place] [address].  Professional component performed at [employer] [facility]. [facility], [place] [address], [place], [place] [address].  Immunohistochemistry Technical component (if applicable) was performed at [facility]. [address], [place], [place] [address].   IMMUNOHISTOCHEMISTRY DISCLAIMER (if applicable): Some of these immunohistochemical stains may have been developed and the performance characteristics determine by [facility]. Some may not have been cleared or approved by the U.S. Food and Drug Administration. The FDA has determined that such clearance or approval is not necessary. This test is used for clinical purposes. It should not be regarded as investigational or for research. This laboratory is certified under the [place] (CLIA-88) as qualified to perform high complexity clinical laboratory testing.  The controls stained appropriately.   CBC with Differential     Status: Abnormal   Collection Time: 07/16/20 12:56 PM  Result Value Ref Range   WBC 8.8 4.0 - 10.5 K/uL   RBC 4.60 3.87 - 5.11 MIL/uL   Hemoglobin 12.0 12.0 - 15.0 g/dL   HCT 39.4 36.0 - 46.0 %   MCV 85.7 80.0 - 100.0 fL   MCH 26.1 26.0 - 34.0 pg  MCHC 30.5 30.0 - 36.0 g/dL   RDW 13.7 11.5 - 15.5 %   Platelets 500 (H) 150 - 400 K/uL   nRBC 0.0 0.0 - 0.2 %   Neutrophils Relative % 60 %   Neutro Abs 5.4 1.7 - 7.7 K/uL   Lymphocytes Relative 32 %   Lymphs Abs 2.8 0.7 - 4.0 K/uL   Monocytes Relative 6 %   Monocytes Absolute 0.5 0.1 - 1.0 K/uL   Eosinophils Relative 0 %   Eosinophils Absolute 0.0 0.0 - 0.5 K/uL   Basophils Relative 1 %   Basophils Absolute 0.0 0.0 -  0.1 K/uL   Immature Granulocytes 1 %   Abs Immature Granulocytes 0.05 0.00 - 0.07 K/uL    Comment: Performed at [facility], [address], [place], [place] [address]  Comprehensive metabolic panel     Status: Abnormal   Collection Time: 07/16/20 12:56 PM  Result Value Ref Range   Sodium 136 135 - 145 mmol/L   Potassium 3.4 (L) 3.5 - 5.1 mmol/L   Chloride 97 (L) 98 - 111 mmol/L   CO2 26 22 - 32 mmol/L   Glucose, Bld 106 (H) 70 - 99 mg/dL    Comment: Glucose reference range applies only to samples taken after fasting for at least 8 hours.   BUN 11 6 - 20 mg/dL   Creatinine, Ser 0.83 0.44 - 1.00 mg/dL   Calcium 9.6 8.9 - 10.3 mg/dL   Total Protein 8.7 (H) 6.5 - 8.1 g/dL   Albumin 3.9 3.5 - 5.0 g/dL   AST 12 (L) 15 - 41 U/L   ALT 11 0 - 44 U/L   Alkaline Phosphatase 74 38 - 126 U/L   Total Bilirubin 0.5 0.3 - 1.2 mg/dL   GFR, Estimated >60 >60 mL/min    Comment: (NOTE) Calculated using the CKD-EPI Creatinine Equation (2021)    Anion gap 13 5 - 15    Comment: Performed at [facility], [address], [place], [place] [address]  Iron and TIBC     Status: None   Collection Time: 07/16/20 12:56 PM  Result Value Ref Range   Iron 41 28 - 170 ug/dL   TIBC 379 250 - 450 ug/dL   Saturation Ratios 11 10.4 - 31.8 %   UIBC 338 ug/dL    Comment: Performed at [facility], [address] [place], [place] [address]  Ferritin     Status: None   Collection Time: 07/16/20 12:56 PM  Result Value Ref Range   Ferritin 29 11 - 307 ng/mL    Comment: Performed at [facility], [address], [place], [place] [address]    RADIOGRAPHIC STUDIES: I have personally reviewed the radiological images as listed and agreed with the findings in the report. No results found.  ASSESSMENT:  1.  Thrombocytosis: -Patient seen at the request of Dr. Moshe Cipro for isolated thrombocytosis since 2008. -Her ferritin on 02/21/2020 was 47.  Patient was told to start taking iron tablet since then. -She does not have  any history of thrombosis.  She is on progesterone only oral contraceptive pill for menorrhagia which was prescribed recently and patient is yet to start.  Never had blood transfusion. -She is on phentermine for few months and has lost 24 pounds.  Denies any fevers or night sweats. -No history of connective tissue disorders. -No vasomotor symptoms, erythromelalgia's, or aquagenic pruritus.  2.  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  She is a non-smoker. -Maternal aunt had pancreatic cancer.  Maternal uncle had throat cancer and a maternal cousin has colon cancer.  PLAN:  1.  Thrombocytosis: -Repeat lab work from 07/16/2020 showed a platelet count of 500,000. -Ferritin was 29.  She continues iron supplements as recommended.  Appears to be tolerating well with only mild constipation. -Her ANA, lupus anticoagulant and rheumatoid factor was negative but her ESR and CRP were slightly elevated. -JAK2 with reflex mutation was negative. -Thrombocytosis likely secondary to iron deficiency anemia. -Recommend repeat labs  in 3 months (CBC, CMP, Ferritin, Iron panel).  2.  Iron deficiency anemia: -Secondary to heavy menstrual cycles and internal hemorrhoids. -Labs from 07/16/2020 show a ferritin of 29 and iron saturations of 11%.  Hemoglobin 12. -She was started on iron supplements and has been tolerating well. -Recently had an EGD/colonoscopy on 06/14/2020 which showed normal esophagus and few gastric polyps.  Colonoscopy showed diverticulosis and nonbleeding internal hemorrhoids.  No specimens were collected. -Denies any active bleeding.  Denies heavy menstrual cycles.  3.  Constipation: -Secondary to iron tablets. -Continue MiraLAX and Colace as needed.  Disposition: -Continue oral iron supplements. -RTC in 3 months for repeat labs (CBC, CMP, ferritin, iron panel) and assessment.  Faythe Casa, NP 07/23/2020 12:52 PM   07/23/20  [place] [phone]

## 2020-08-01 ENCOUNTER — Encounter: Payer: BC Managed Care – PPO | Admitting: Family Medicine

## 2020-09-04 ENCOUNTER — Telehealth: Payer: Self-pay

## 2020-09-04 NOTE — Telephone Encounter (Signed)
Medical Clearance  Copied  Noted Sleeved

## 2020-09-09 ENCOUNTER — Encounter: Payer: BC Managed Care – PPO | Admitting: Family Medicine

## 2020-09-20 DIAGNOSIS — Z1159 Encounter for screening for other viral diseases: Secondary | ICD-10-CM | POA: Diagnosis not present

## 2020-09-20 DIAGNOSIS — E559 Vitamin D deficiency, unspecified: Secondary | ICD-10-CM | POA: Diagnosis not present

## 2020-09-20 DIAGNOSIS — I10 Essential (primary) hypertension: Secondary | ICD-10-CM | POA: Diagnosis not present

## 2020-09-20 DIAGNOSIS — R7303 Prediabetes: Secondary | ICD-10-CM | POA: Diagnosis not present

## 2020-09-21 LAB — HEMOGLOBIN A1C
Est. average glucose Bld gHb Est-mCnc: 131 mg/dL
Hgb A1c MFr Bld: 6.2 % — ABNORMAL HIGH (ref 4.8–5.6)

## 2020-09-21 LAB — BMP8+EGFR
BUN/Creatinine Ratio: 12 (ref 9–23)
BUN: 10 mg/dL (ref 6–24)
CO2: 22 mmol/L (ref 20–29)
Calcium: 10.1 mg/dL (ref 8.7–10.2)
Chloride: 94 mmol/L — ABNORMAL LOW (ref 96–106)
Creatinine, Ser: 0.84 mg/dL (ref 0.57–1.00)
Glucose: 102 mg/dL — ABNORMAL HIGH (ref 65–99)
Potassium: 4.1 mmol/L (ref 3.5–5.2)
Sodium: 136 mmol/L (ref 134–144)
eGFR: 89 mL/min/{1.73_m2} (ref >59)

## 2020-09-21 LAB — HEPATITIS C ANTIBODY: Hep C Virus Ab: 0.1 s/co ratio (ref 0.0–0.9)

## 2020-09-21 LAB — VITAMIN D 25 HYDROXY (VIT D DEFICIENCY, FRACTURES): Vit D, 25-Hydroxy: 42.4 ng/mL (ref 30.0–100.0)

## 2020-09-25 ENCOUNTER — Encounter: Payer: Self-pay | Admitting: Family Medicine

## 2020-09-25 ENCOUNTER — Other Ambulatory Visit (HOSPITAL_COMMUNITY)
Admission: RE | Admit: 2020-09-25 | Discharge: 2020-09-25 | Disposition: A | Discharge status: Home / Self Care | Payer: BC Managed Care – PPO | Source: Ambulatory Visit | Attending: Family Medicine | Admitting: Family Medicine

## 2020-09-25 ENCOUNTER — Other Ambulatory Visit: Payer: Self-pay

## 2020-09-25 ENCOUNTER — Ambulatory Visit (INDEPENDENT_AMBULATORY_CARE_PROVIDER_SITE_OTHER): Payer: BC Managed Care – PPO | Admitting: Family Medicine

## 2020-09-25 VITALS — BP 138/92 | HR 96 | Temp 98.4°F | Ht 64.0 in | Wt 264.0 lb

## 2020-09-25 DIAGNOSIS — Z23 Encounter for immunization: Secondary | ICD-10-CM

## 2020-09-25 DIAGNOSIS — Z0000 Encounter for general adult medical examination without abnormal findings: Secondary | ICD-10-CM

## 2020-09-25 DIAGNOSIS — Z124 Encounter for screening for malignant neoplasm of cervix: Secondary | ICD-10-CM | POA: Insufficient documentation

## 2020-09-25 MED ORDER — PHENTERMINE HCL 37.5 MG PO TABS: 37.5000 mg | ORAL_TABLET | Freq: Every day | ORAL | 2 refills | Status: DC

## 2020-09-25 NOTE — Assessment & Plan Note (Signed)
After obtaining informed consent, the vaccine is  administered , with no adverse effect noted at the time of administration.  

## 2020-09-25 NOTE — Patient Instructions (Addendum)
F/U in 3 months, call if you need me sooner  TdaP today.  Form, completion today  Weight loss goal of 4 pounds per month  Need to increase vegetables, and reducs sugary and starchy / white foods like bread, rice, potata, pasta, as you are prediabetic.   You need the hepatitis B series of vaccines if you have not had this   Diabetes Care, 44(Suppl 1), I71-I45. https://doi.org/https://doi.org/10.2337/dc21-S003">  Prediabetes Prediabetes is when your blood sugar (blood glucose) level is higher than normal but not high enough for you to be diagnosed with type 2 diabetes. Having prediabetes puts you at risk for developing type 2 diabetes (type 2 diabetes mellitus). With certain lifestyle changes, you may be able to prevent or delay the onset of type 2 diabetes. This is important because type 2 diabetes can lead to serious complications, such as:  Heart disease.  Stroke.  Blindness.  Kidney disease.  Depression.  Poor circulation in the feet and legs. In severe cases, this could lead to surgical removal of a leg (amputation). What are the causes? The exact cause of prediabetes is not known. It may result from insulin resistance. Insulin resistance develops when cells in the body do not respond properly to insulin that the body makes. This can cause excess glucose to build up in the blood. High blood glucose (hyperglycemia) can develop. What increases the risk? The following factors may make you more likely to develop this condition:  You have a family member with type 2 diabetes.  You are older than 45 years.  You had a temporary form of diabetes during a pregnancy (gestational diabetes).  You had polycystic ovary syndrome (PCOS).  You are overweight or obese.  You are inactive (sedentary).  You have a history of heart disease, including problems with cholesterol levels, high levels of blood fats, or high blood pressure. What are the signs or symptoms? You may have no  symptoms. If you do have symptoms, they may include:  Increased hunger.  Increased thirst.  Increased urination.  Vision changes, such as blurry vision.  Tiredness (fatigue). How is this diagnosed? This condition can be diagnosed with blood tests. Your blood glucose may be checked with one or more of the following tests:  A fasting blood glucose (FBG) test. You will not be allowed to eat (you will fast) for at least 8 hours before a blood sample is taken.  An A1C blood test (hemoglobin A1C). This test provides information about blood glucose levels over the previous 2?3 months.  An oral glucose tolerance test (OGTT). This test measures your blood glucose at two points in time: ? After fasting. This is your baseline level. ? Two hours after you drink a beverage that contains glucose. You may be diagnosed with prediabetes if:  Your FBG is 100?125 mg/dL (5.6-6.9 mmol/L).  Your A1C level is 5.7?6.4% (39-46 mmol/mol).  Your OGTT result is 140?199 mg/dL (7.8-11 mmol/L). These blood tests may be repeated to confirm your diagnosis.   How is this treated? Treatment may include dietary and lifestyle changes to help lower your blood glucose and prevent type 2 diabetes from developing. In some cases, medicine may be prescribed to help lower the risk of type 2 diabetes. Follow these instructions at home: Nutrition  Follow a healthy meal plan. This includes eating lean proteins, whole grains, legumes, fresh fruits and vegetables, low-fat dairy products, and healthy fats.  Follow instructions from your health care provider about eating or drinking restrictions.  Meet with a  dietitian to create a healthy eating plan that is right for you.   Lifestyle  Do moderate-intensity exercise for at least 30 minutes a day on 5 or more days each week, or as told by your health care provider. A mix of activities may be best, such as: ? Brisk walking, swimming, biking, and weight lifting.  Lose weight  as told by your health care provider. Losing 5-7% of your body weight can reverse insulin resistance.  Do not drink alcohol if: ? Your health care provider tells you not to drink. ? You are pregnant, may be pregnant, or are planning to become pregnant.  If you drink alcohol: ? Limit how much you use to:  0-1 drink a day for women.  0-2 drinks a day for men. ? Be aware of how much alcohol is in your drink. In the [place]., one drink equals one 12 oz bottle of beer (355 mL), one 5 oz glass of wine (148 mL), or one 1 oz glass of hard liquor (44 mL). General instructions  Take over-the-counter and prescription medicines only as told by your health care provider. You may be prescribed medicines that help lower the risk of type 2 diabetes.  Do not use any products that contain nicotine or tobacco, such as cigarettes, e-cigarettes, and chewing tobacco. If you need help quitting, ask your health care provider.  Keep all follow-up visits. This is important. Where to find more information  American Diabetes Association: www.diabetes.org  Academy of Nutrition and Dietetics: www.eatright.org  American Heart Association: www.heart.org Contact a health care provider if:  You have any of these symptoms: ? Increased hunger. ? Increased urination. ? Increased thirst. ? Fatigue. ? Vision changes, such as blurry vision. Get help right away if you:  Have shortness of breath.  Feel confused.  Vomit or feel like you may vomit. Summary  Prediabetes is when your blood sugar (blood glucose)level is higher than normal but not high enough for you to be diagnosed with type 2 diabetes.  Having prediabetes puts you at risk for developing type 2 diabetes (type 2 diabetes mellitus).  Make lifestyle changes such as eating a healthy diet and exercising regularly to help prevent diabetes. Lose weight as told by your health care provider. This information is not intended to replace advice given to you by  your health care provider. Make sure you discuss any questions you have with your health care provider. Document Revised: 07/20/2019 Document Reviewed: 07/20/2019 Elsevier Patient Education  [place].

## 2020-09-25 NOTE — Assessment & Plan Note (Signed)
  Patient re-educated about  the importance of commitment to a  minimum of 150 minutes of exercise per week as able.  The importance of healthy food choices with portion control discussed, as well as eating regularly and within a 12 hour window most days. The need to choose "clean , green" food 50 to 75% of the time is discussed, as well as to make water the primary drink and set a goal of 64 ounces water daily.    Weight /BMI 09/25/2020 06/14/2020 06/12/2020  WEIGHT 264 lb 266 lb 266 lb  HEIGHT 5\' 4"  5\' 4"  5\' 4"   BMI 45.32 kg/m2 45.66 kg/m2 45.66 kg/m2

## 2020-09-25 NOTE — Progress Notes (Signed)
Rose Mcguire     MRN: [id number]      DOB: 1975-09-03  HPI: Patient is in for annual physical exam. No other health concerns are expressed or addressed at the visit. Recent labs,  are reviewed. Immunization is reviewed , and  updated if needed.   PE: BP (!) 138/92 (BP Location: Left Arm, Patient Position: Sitting, Cuff Size: Large)   Pulse 96   Temp 98.4 F (36.9 C) (Temporal)   Ht 5\' 4"  (1.626 m)   Wt 264 lb (119.7 kg)   LMP 09/16/2020   SpO2 98%   BMI 45.32 kg/m   Pleasant  female, alert and oriented x 3, in no cardio-pulmonary distress. Afebrile. HEENT No facial trauma or asymetry. Sinuses non tender.  Extra occullar muscles intact.. External ears normal, . Neck: supple, no adenopathy,JVD or thyromegaly.No bruits.  Chest: Clear to ascultation bilaterally.No crackles or wheezes. Non tender to palpation  Breast: No asymetry,no masses or lumps. No tenderness. No nipple discharge or inversion. No axillary or supraclavicular adenopathy  Cardiovascular system; Heart sounds normal,  S1 and  S2 ,no S3.  No murmur, or thrill. Apical beat not displaced Peripheral pulses normal.  Abdomen: Soft, non tender, no organomegaly or masses. No bruits. Bowel sounds normal. No guarding, tenderness or rebound.   GU: External genitalia normal female genitalia , normal female distribution of hair. No lesions. Urethral meatus normal in size, no  Prolapse, no lesions visibly  Present. Bladder non tender. Vagina pink and moist , with no visible lesions , discharge present . Adequate pelvic support no  cystocele or rectocele noted Cervix pink and appears healthy, no lesions or ulcerations noted, no discharge noted from os Uterus normal size, no adnexal masses, no cervical motion or adnexal tenderness.   Musculoskeletal exam: Full ROM of spine, hips , shoulders and knees. No deformity ,swelling or crepitus noted. No muscle wasting or atrophy.   Neurologic: Cranial  nerves 2 to 12 intact. Power, tone ,sensation and reflexes normal throughout. No disturbance in gait. No tremor.  Skin: Intact, no ulceration, erythema , scaling or rash noted. Pigmentation normal throughout  Psych; Normal mood and affect. Judgement and concentration normal   Assessment & Plan:  Annual physical exam Annual exam as documented. Counseling done  re healthy lifestyle involving commitment to 150 minutes exercise per week, heart healthy diet, and attaining healthy weight.The importance of adequate sleep also discussed. Regular seat belt use and home safety, is also discussed. Changes in health habits are decided on by the patient with goals and time frames  set for achieving them. Immunization and cancer screening needs are specifically addressed at this visit.   Need for Tdap vaccination After obtaining informed consent, the vaccine is  administered , with no adverse effect noted at the time of administration.   Morbid obesity  Patient re-educated about  the importance of commitment to a  minimum of 150 minutes of exercise per week as able.  The importance of healthy food choices with portion control discussed, as well as eating regularly and within a 12 hour window most days. The need to choose "clean , green" food 50 to 75% of the time is discussed, as well as to make water the primary drink and set a goal of 64 ounces water daily.    Weight /BMI 09/25/2020 06/14/2020 06/12/2020  WEIGHT 264 lb 266 lb 266 lb  HEIGHT 5\' 4"  5\' 4"  5\' 4"   BMI 45.32 kg/m2 45.66 kg/m2 45.66 kg/m2

## 2020-09-25 NOTE — Assessment & Plan Note (Signed)

## 2020-09-27 ENCOUNTER — Encounter: Payer: Self-pay | Admitting: Family Medicine

## 2020-10-03 LAB — CYTOLOGY - PAP
Comment: NEGATIVE
Comment: NEGATIVE
Diagnosis: NEGATIVE
HPV 16: NEGATIVE
HPV 18 / 45: NEGATIVE
High risk HPV: POSITIVE — AB

## 2020-10-23 ENCOUNTER — Encounter: Payer: BC Managed Care – PPO | Admitting: Family Medicine

## 2020-10-31 ENCOUNTER — Other Ambulatory Visit: Payer: Self-pay | Admitting: Family Medicine

## 2020-12-26 ENCOUNTER — Ambulatory Visit: Payer: BC Managed Care – PPO | Admitting: Family Medicine

## 2021-01-27 ENCOUNTER — Other Ambulatory Visit (HOSPITAL_COMMUNITY): Payer: Self-pay | Admitting: *Deleted

## 2021-01-27 DIAGNOSIS — D509 Iron deficiency anemia, unspecified: Secondary | ICD-10-CM

## 2021-01-28 ENCOUNTER — Inpatient Hospital Stay (HOSPITAL_COMMUNITY): Payer: BC Managed Care – PPO | Attending: Hematology

## 2021-01-30 ENCOUNTER — Ambulatory Visit: Payer: BC Managed Care – PPO | Admitting: Family Medicine

## 2021-01-31 ENCOUNTER — Inpatient Hospital Stay (HOSPITAL_COMMUNITY): Payer: BC Managed Care – PPO

## 2021-02-04 ENCOUNTER — Ambulatory Visit (HOSPITAL_COMMUNITY): Payer: BC Managed Care – PPO | Admitting: Hematology

## 2021-02-14 ENCOUNTER — Ambulatory Visit (HOSPITAL_COMMUNITY): Payer: BC Managed Care – PPO | Admitting: Physician Assistant

## 2021-03-10 IMAGING — MG DIGITAL SCREENING BILAT W/ TOMO W/ CAD
8 series · 8 of 24 positions shown · non-contrast
Comparison: Previous exam(s).

CLINICAL DATA: Screening.

EXAM:
DIGITAL SCREENING BILATERAL MAMMOGRAM WITH TOMO AND CAD

[L MLO synth-2D]
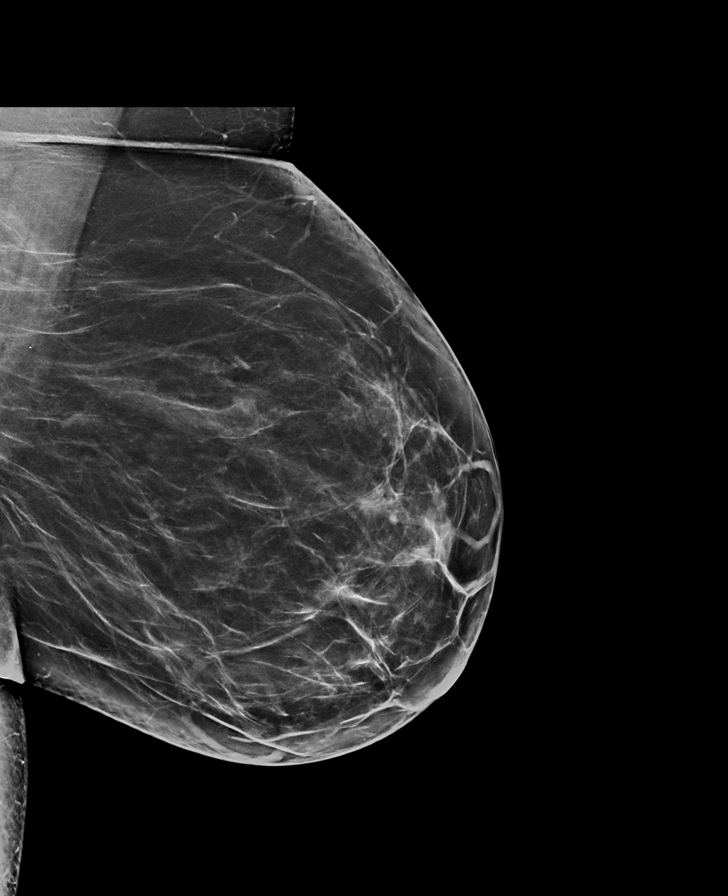

[R MLO synth-2D]
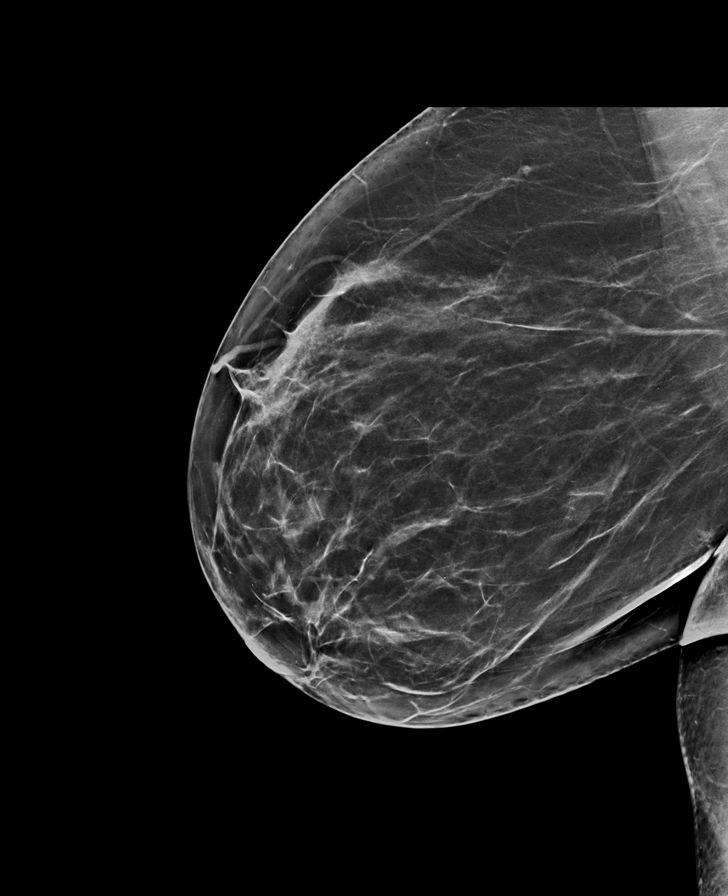

[L CC synth-2D]
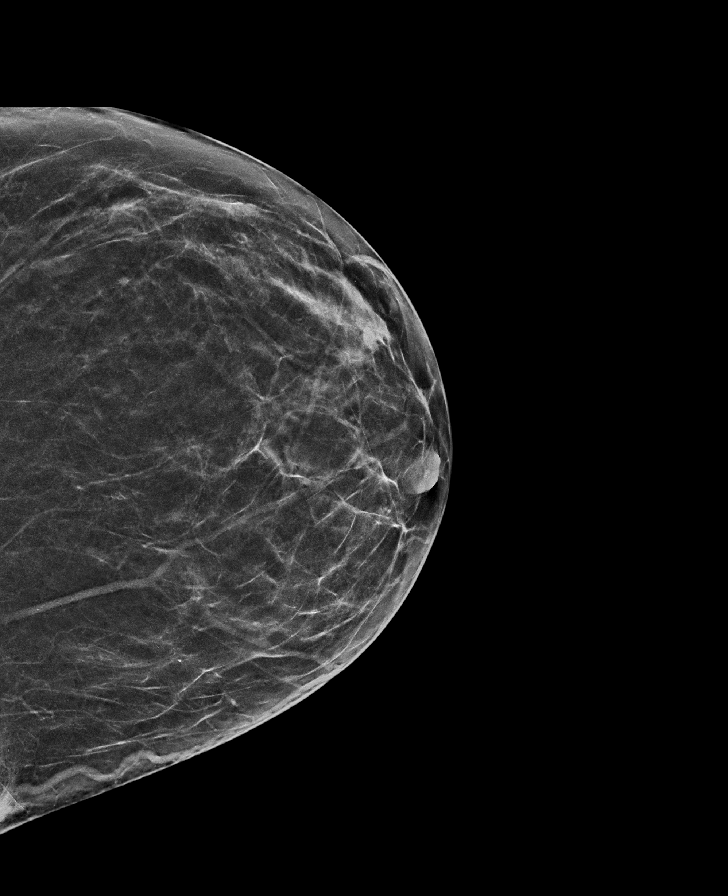

[R CC synth-2D]
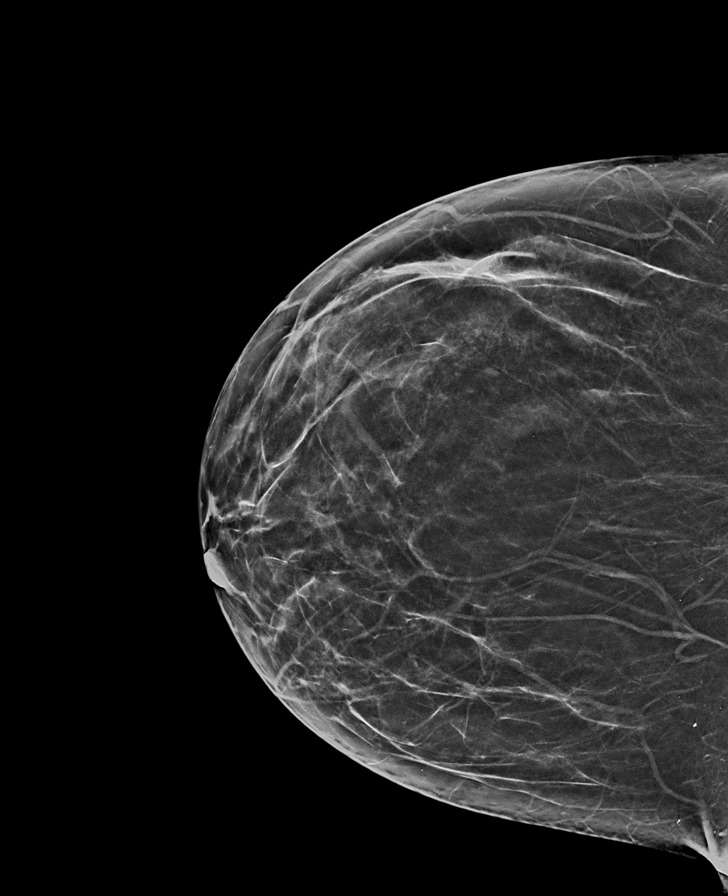

[L MLO tomo · tomo slice 39/78.0]
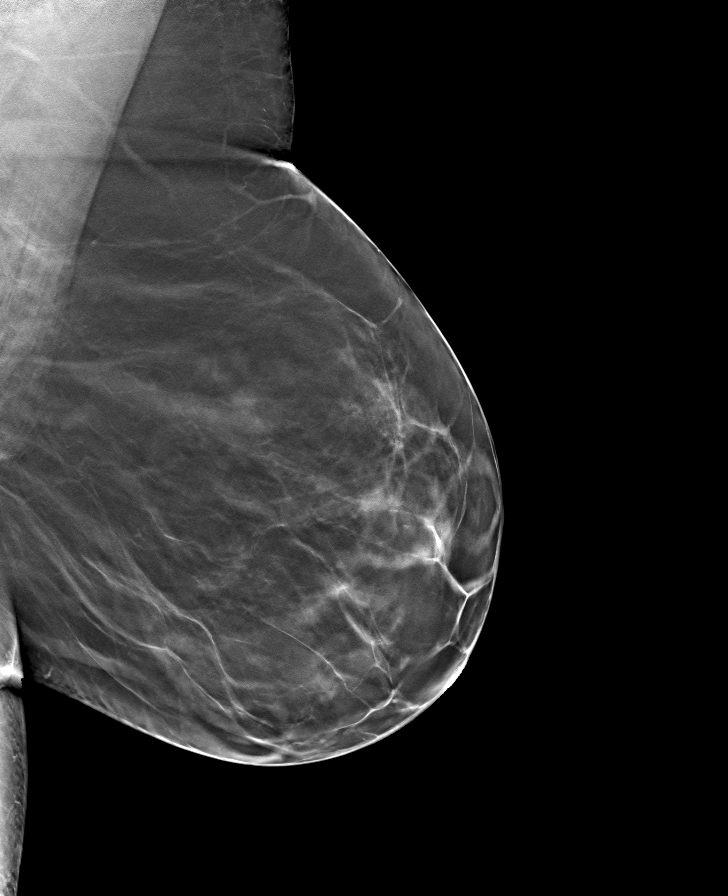

[L CC tomo · tomo slice 31/61.0]
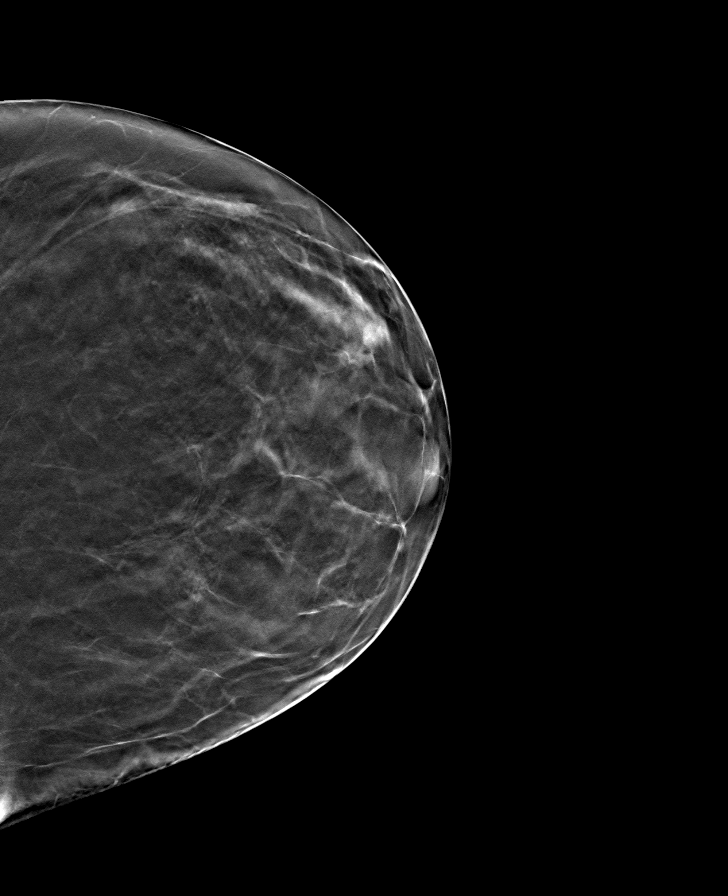

[R MLO tomo · tomo slice 37/74.0]
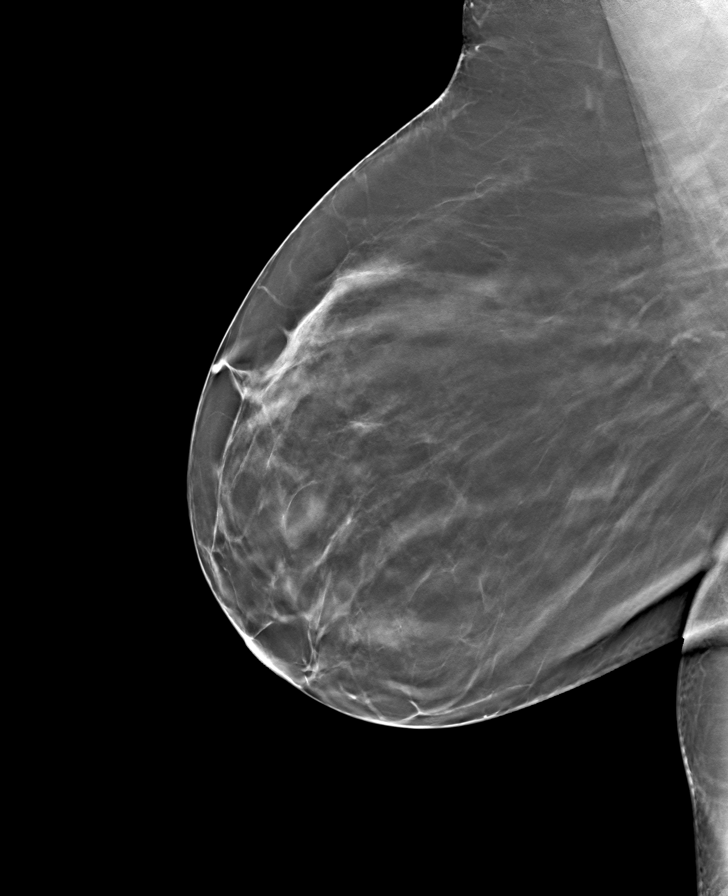

[R CC tomo · tomo slice 32/63.0]
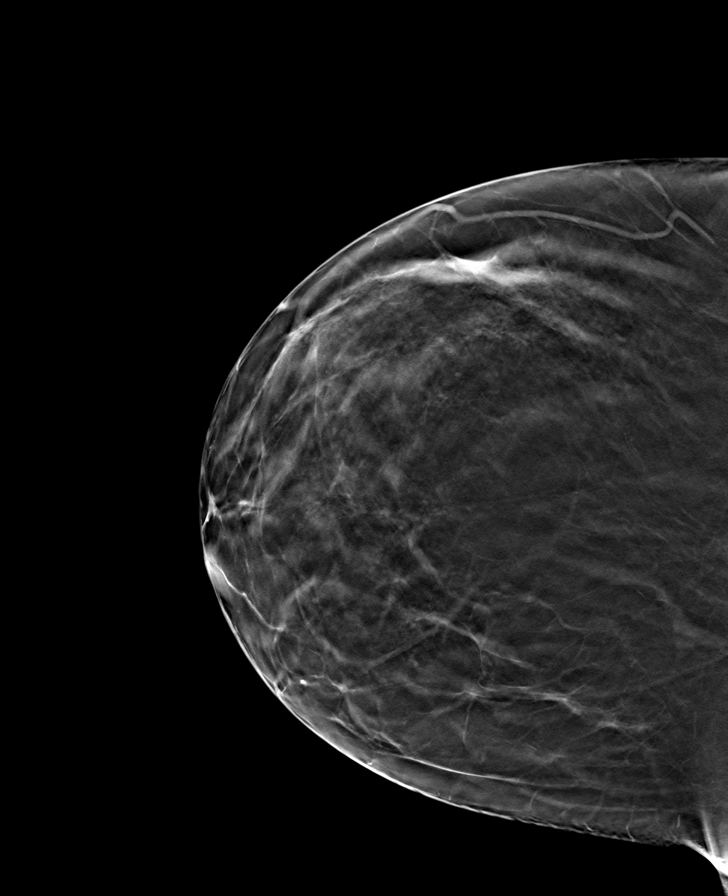

[8 of 24 positions shown; findings below may reference images not displayed]

ACR Breast Density Category b: There are scattered areas of
fibroglandular density.
FINDINGS: There are no findings suspicious for malignancy. Images were
processed with CAD.
IMPRESSION: No mammographic evidence of malignancy. A result letter of this
screening mammogram will be mailed directly to the patient.

RECOMMENDATION:
Screening mammogram in one year. (Code:[id number])

BI-RADS CATEGORY  1: Negative.

## 2021-03-12 ENCOUNTER — Other Ambulatory Visit: Payer: Self-pay

## 2021-03-12 ENCOUNTER — Encounter: Payer: Self-pay | Admitting: Family Medicine

## 2021-03-12 ENCOUNTER — Ambulatory Visit: Payer: BC Managed Care – PPO | Admitting: Family Medicine

## 2021-03-12 ENCOUNTER — Other Ambulatory Visit: Payer: Self-pay | Admitting: Family Medicine

## 2021-03-12 VITALS — BP 136/78 | HR 121 | Resp 18 | Ht 64.0 in | Wt 277.1 lb

## 2021-03-12 DIAGNOSIS — R7303 Prediabetes: Secondary | ICD-10-CM | POA: Diagnosis not present

## 2021-03-12 DIAGNOSIS — Z23 Encounter for immunization: Secondary | ICD-10-CM

## 2021-03-12 DIAGNOSIS — D539 Nutritional anemia, unspecified: Secondary | ICD-10-CM

## 2021-03-12 DIAGNOSIS — I10 Essential (primary) hypertension: Secondary | ICD-10-CM | POA: Diagnosis not present

## 2021-03-12 LAB — POCT GLYCOSYLATED HEMOGLOBIN (HGB A1C): HbA1c, POC (prediabetic range): 6 % (ref 5.7–6.4)

## 2021-03-12 MED ORDER — TIRZEPATIDE 5 MG/0.5ML ~~LOC~~ SOAJ: 5.0000 mg | SUBCUTANEOUS | 1 refills | Status: DC

## 2021-03-12 MED ORDER — TIRZEPATIDE 2.5 MG/0.5ML ~~LOC~~ SOAJ: 2.5000 mg | SUBCUTANEOUS | 0 refills | Status: DC

## 2021-03-12 NOTE — Patient Instructions (Addendum)
FR/u in 6 to 9 weeks, call if you need me before  glycoHB in office has improved  Flu  vaccine today  New is mounjaro once weekly for blood sugar and weright management, nurse pls give coupon   TSH, chem 7 and EGFR , cbc and iron today Thanks for choosing [facility], we consider it a privelige to serve you.

## 2021-03-13 LAB — CBC
Hematocrit: 37.9 % (ref 34.0–46.6)
Hemoglobin: 12.3 g/dL (ref 11.1–15.9)
MCH: 26.9 pg (ref 26.6–33.0)
MCHC: 32.5 g/dL (ref 31.5–35.7)
MCV: 83 fL (ref 79–97)
Platelets: 557 10*3/uL — ABNORMAL HIGH (ref 150–450)
RBC: 4.58 x10E6/uL (ref 3.77–5.28)
RDW: 12.9 % (ref 11.7–15.4)
WBC: 9.6 10*3/uL (ref 3.4–10.8)

## 2021-03-13 LAB — BMP8+EGFR
BUN/Creatinine Ratio: 8 — ABNORMAL LOW (ref 9–23)
BUN: 8 mg/dL (ref 6–24)
CO2: 25 mmol/L (ref 20–29)
Calcium: 9.9 mg/dL (ref 8.7–10.2)
Chloride: 98 mmol/L (ref 96–106)
Creatinine, Ser: 1 mg/dL (ref 0.57–1.00)
Glucose: 120 mg/dL — ABNORMAL HIGH (ref 70–99)
Potassium: 4 mmol/L (ref 3.5–5.2)
Sodium: 138 mmol/L (ref 134–144)
eGFR: 72 mL/min/{1.73_m2} (ref >59)

## 2021-03-13 LAB — TSH: TSH: 2.8 u[IU]/mL (ref 0.450–4.500)

## 2021-03-13 LAB — IRON: Iron: 36 ug/dL (ref 27–159)

## 2021-03-16 ENCOUNTER — Encounter: Payer: Self-pay | Admitting: Family Medicine

## 2021-03-16 NOTE — Assessment & Plan Note (Signed)
Patient educated about the importance of limiting  Carbohydrate intake , the need to commit to daily physical activity for a minimum of 30 minutes , and to commit weight loss. The fact that changes in all these areas will reduce or eliminate all together the development of diabetes is stressed.  Improved which is good , she is applauded on this  Diabetic Labs Latest Ref Rng & Units 03/12/2021 09/20/2020 07/16/2020 06/12/2020 03/18/2020  HbA1c 5.7 - 6.4 % 6.0 6.2(H) - - -  Chol <200 mg/dL - - - - -  HDL > OR = 50 mg/dL - - - - -  Calc LDL mg/dL (calc) - - - - -  Triglycerides <150 mg/dL - - - - -  Creatinine 0.57 - 1.00 mg/dL 1.00 0.84 0.83 0.83 0.93   BP/Weight 03/12/2021 09/25/2020 07/23/2020 06/14/2020 06/12/2020 0/60/0459 01/08/7413  Systolic BP 239 532 023 343 568 616 -  Diastolic BP 78 92 88 78 88 85 -  Wt. (Lbs) 277.12 264 - 266 266 268 262  BMI 47.57 45.32 - 45.66 45.66 46 44.97   No flowsheet data found.

## 2021-03-16 NOTE — Progress Notes (Signed)
DANNETTA LEKAS     MRN: [id number]      DOB: 04/17/1976   HPI [name] is here for follow up and re-evaluation of chronic medical conditions, medication management and review of any available recent lab and radiology data.  Preventive health is updated, specifically  Cancer screening and Immunization.   Questions or concerns regarding consultations or procedures which the PT has had in the interim are  addressed. The PT denies any adverse reactions to current medications since the last visit.  There are no new concerns.  There are no specific complaints   ROS Denies recent fever or chills. Denies sinus pressure, nasal congestion, ear pain or sore throat. Denies chest congestion, productive cough or wheezing. Denies chest pains, palpitations and leg swelling Denies abdominal pain, nausea, vomiting,diarrhea or constipation.   Denies dysuria, frequency, hesitancy or incontinence. Denies joint pain, swelling and limitation in mobility. Denies headaches, seizures, numbness, or tingling. Denies depression, anxiety or insomnia. Denies skin break down or rash.   PE  BP 136/78   Pulse (!) 121   Resp 18   Ht 5\' 4"  (1.626 m)   Wt 277 lb 1.9 oz (125.7 kg)   SpO2 98%   BMI 47.57 kg/m   Patient alert and oriented and in no cardiopulmonary distress.  HEENT: No facial asymmetry, EOMI,     Neck supple .  Chest: Clear to auscultation bilaterally.  CVS: S1, S2 no murmurs, no S3.Regular rate.  ABD: Soft non tender.   Ext: No edema  MS: Adequate ROM spine, shoulders, hips and knees.  Skin: Intact, no ulcerations or rash noted.  Psych: Good eye contact, normal affect. Memory intact not anxious or depressed appearing.  CNS: CN 2-12 intact, power,  normal throughout.no focal deficits noted.   Assessment & Plan Essential hypertension Controlled, no change in medication DASH diet and commitment to daily physical activity for a minimum of 30 minutes discussed and encouraged,  as a part of hypertension management. The importance of attaining a healthy weight is also discussed.  BP/Weight 03/12/2021 09/25/2020 07/23/2020 06/14/2020 06/12/2020 4/31/5400 12/07/7617  Systolic BP 509 326 712 458 099 833 -  Diastolic BP 78 92 88 78 88 85 -  Wt. (Lbs) 277.12 264 - 266 266 268 262  BMI 47.57 45.32 - 45.66 45.66 46 44.97       Morbid obesity  Patient re-educated about  the importance of commitment to a  minimum of 150 minutes of exercise per week as able.  The importance of healthy food choices with portion control discussed, as well as eating regularly and within a 12 hour window most days. The need to choose "clean , green" food 50 to 75% of the time is discussed, as well as to make water the primary drink and set a goal of 64 ounces water daily.    Weight /BMI 03/12/2021 09/25/2020 06/14/2020  WEIGHT 277 lb 1.9 oz 264 lb 266 lb  HEIGHT 5\' 4"  5\' 4"  5\' 4"   BMI 47.57 kg/m2 45.32 kg/m2 45.66 kg/m2   Deteriorated, start mounjaro weekly and commit to lifestyle changes   Prediabetes Patient educated about the importance of limiting  Carbohydrate intake , the need to commit to daily physical activity for a minimum of 30 minutes , and to commit weight loss. The fact that changes in all these areas will reduce or eliminate all together the development of diabetes is stressed.  Improved which is good , she is applauded on this  Diabetic Labs  Latest Ref Rng & Units 03/12/2021 09/20/2020 07/16/2020 06/12/2020 03/18/2020  HbA1c 5.7 - 6.4 % 6.0 6.2(H) - - -  Chol <200 mg/dL - - - - -  HDL > OR = 50 mg/dL - - - - -  Calc LDL mg/dL (calc) - - - - -  Triglycerides <150 mg/dL - - - - -  Creatinine 0.57 - 1.00 mg/dL 1.00 0.84 0.83 0.83 0.93   BP/Weight 03/12/2021 09/25/2020 07/23/2020 06/14/2020 06/12/2020 10/25/7626 07/03/5174  Systolic BP 160 737 106 269 485 462 -  Diastolic BP 78 92 88 78 88 85 -  Wt. (Lbs) 277.12 264 - 266 266 268 262  BMI 47.57 45.32 - 45.66 45.66 46 44.97   No  flowsheet data found.

## 2021-03-16 NOTE — Assessment & Plan Note (Signed)
Controlled, no change in medication DASH diet and commitment to daily physical activity for a minimum of 30 minutes discussed and encouraged, as a part of hypertension management. The importance of attaining a healthy weight is also discussed.  BP/Weight 03/12/2021 09/25/2020 07/23/2020 06/14/2020 06/12/2020 0/02/4044 01/03/3684  Systolic BP 992 341 443 601 658 006 -  Diastolic BP 78 92 88 78 88 85 -  Wt. (Lbs) 277.12 264 - 266 266 268 262  BMI 47.57 45.32 - 45.66 45.66 46 44.97

## 2021-03-16 NOTE — Assessment & Plan Note (Signed)
  Patient re-educated about  the importance of commitment to a  minimum of 150 minutes of exercise per week as able.  The importance of healthy food choices with portion control discussed, as well as eating regularly and within a 12 hour window most days. The need to choose "clean , green" food 50 to 75% of the time is discussed, as well as to make water the primary drink and set a goal of 64 ounces water daily.    Weight /BMI 03/12/2021 09/25/2020 06/14/2020  WEIGHT 277 lb 1.9 oz 264 lb 266 lb  HEIGHT 5\' 4"  5\' 4"  5\' 4"   BMI 47.57 kg/m2 45.32 kg/m2 45.66 kg/m2   Deteriorated, start mounjaro weekly and commit to lifestyle changes

## 2021-04-01 ENCOUNTER — Other Ambulatory Visit: Payer: Self-pay

## 2021-04-01 ENCOUNTER — Telehealth: Payer: Self-pay

## 2021-04-01 MED ORDER — TIRZEPATIDE 2.5 MG/0.5ML ~~LOC~~ SOAJ: 2.5000 mg | SUBCUTANEOUS | 0 refills | Status: DC

## 2021-04-01 NOTE — Telephone Encounter (Signed)
Patient called need refill tirzepatide Rose Mcguire) 2.5 MG/0.5ML Pen  Pharmacy: [facility]

## 2021-04-02 ENCOUNTER — Inpatient Hospital Stay (HOSPITAL_COMMUNITY): Payer: BC Managed Care – PPO | Attending: Hematology

## 2021-04-02 ENCOUNTER — Other Ambulatory Visit: Payer: Self-pay

## 2021-04-02 DIAGNOSIS — D509 Iron deficiency anemia, unspecified: Secondary | ICD-10-CM | POA: Diagnosis not present

## 2021-04-02 LAB — COMPREHENSIVE METABOLIC PANEL
ALT: 13 U/L (ref 0–44)
AST: 14 U/L — ABNORMAL LOW (ref 15–41)
Albumin: 4 g/dL (ref 3.5–5.0)
Alkaline Phosphatase: 84 U/L (ref 38–126)
Anion gap: 10 (ref 5–15)
BUN: 11 mg/dL (ref 6–20)
CO2: 27 mmol/L (ref 22–32)
Calcium: 9.5 mg/dL (ref 8.9–10.3)
Chloride: 96 mmol/L — ABNORMAL LOW (ref 98–111)
Creatinine, Ser: 0.94 mg/dL (ref 0.44–1.00)
GFR, Estimated: >60 mL/min (ref >60)
Glucose, Bld: 112 mg/dL — ABNORMAL HIGH (ref 70–99)
Potassium: 3.4 mmol/L — ABNORMAL LOW (ref 3.5–5.1)
Sodium: 133 mmol/L — ABNORMAL LOW (ref 135–145)
Total Bilirubin: 0.4 mg/dL (ref 0.3–1.2)
Total Protein: 8.5 g/dL — ABNORMAL HIGH (ref 6.5–8.1)

## 2021-04-02 LAB — CBC WITH DIFFERENTIAL/PLATELET
Abs Immature Granulocytes: 0.05 10*3/uL (ref 0.00–0.07)
Basophils Absolute: 0.1 10*3/uL (ref 0.0–0.1)
Basophils Relative: 1 %
Eosinophils Absolute: 0.1 10*3/uL (ref 0.0–0.5)
Eosinophils Relative: 1 %
HCT: 40.9 % (ref 36.0–46.0)
Hemoglobin: 13 g/dL (ref 12.0–15.0)
Immature Granulocytes: 1 %
Lymphocytes Relative: 31 %
Lymphs Abs: 2.8 10*3/uL (ref 0.7–4.0)
MCH: 28 pg (ref 26.0–34.0)
MCHC: 31.8 g/dL (ref 30.0–36.0)
MCV: 88 fL (ref 80.0–100.0)
Monocytes Absolute: 0.5 10*3/uL (ref 0.1–1.0)
Monocytes Relative: 5 %
Neutro Abs: 5.8 10*3/uL (ref 1.7–7.7)
Neutrophils Relative %: 61 %
Platelets: 542 10*3/uL — ABNORMAL HIGH (ref 150–400)
RBC: 4.65 MIL/uL (ref 3.87–5.11)
RDW: 13.4 % (ref 11.5–15.5)
WBC: 9.3 10*3/uL (ref 4.0–10.5)
nRBC: 0 % (ref 0.0–0.2)

## 2021-04-02 LAB — IRON AND TIBC
Iron: 39 ug/dL (ref 28–170)
Saturation Ratios: 11 % (ref 10.4–31.8)
TIBC: 368 ug/dL (ref 250–450)
UIBC: 329 ug/dL

## 2021-04-02 LAB — FERRITIN: Ferritin: 39 ng/mL (ref 11–307)

## 2021-04-04 ENCOUNTER — Encounter (HOSPITAL_COMMUNITY): Payer: Self-pay | Admitting: *Deleted

## 2021-04-04 ENCOUNTER — Emergency Department (HOSPITAL_COMMUNITY)
Admission: EM | Admit: 2021-04-04 | Discharge: 2021-04-04 | Disposition: A | Discharge status: Home / Self Care | Payer: BC Managed Care – PPO | Attending: Emergency Medicine | Admitting: Emergency Medicine

## 2021-04-04 DIAGNOSIS — Z794 Long term (current) use of insulin: Secondary | ICD-10-CM | POA: Insufficient documentation

## 2021-04-04 DIAGNOSIS — I10 Essential (primary) hypertension: Secondary | ICD-10-CM | POA: Diagnosis not present

## 2021-04-04 DIAGNOSIS — M545 Low back pain, unspecified: Secondary | ICD-10-CM | POA: Insufficient documentation

## 2021-04-04 DIAGNOSIS — Z79899 Other long term (current) drug therapy: Secondary | ICD-10-CM | POA: Diagnosis not present

## 2021-04-04 DIAGNOSIS — R7303 Prediabetes: Secondary | ICD-10-CM | POA: Insufficient documentation

## 2021-04-04 LAB — URINALYSIS, ROUTINE W REFLEX MICROSCOPIC
Bilirubin Urine: NEGATIVE
Glucose, UA: NEGATIVE mg/dL
Hgb urine dipstick: NEGATIVE
Ketones, ur: NEGATIVE mg/dL
Leukocytes,Ua: NEGATIVE
Nitrite: NEGATIVE
Protein, ur: NEGATIVE mg/dL
Specific Gravity, Urine: 1.02 (ref 1.005–1.030)
pH: 7 (ref 5.0–8.0)

## 2021-04-04 MED ORDER — LIDOCAINE 5 % EX PTCH: 1.0000 | MEDICATED_PATCH | CUTANEOUS | 0 refills | Status: DC

## 2021-04-04 NOTE — ED Triage Notes (Signed)
Back pain intermittent for over a week.

## 2021-04-04 NOTE — ED Provider Notes (Signed)
[facility] EMERGENCY DEPARTMENT Provider Note   CSN: [id number] Arrival date & time: 04/04/21  1420     History Chief Complaint  Patient presents with   Back Pain    Rose Mcguire is a 45 y.o. female who presents the emergency department with a 1 week history of intermittent lower back pain.  Patient states she sits a lot at her job.  She denies any injury, heavy lifting, strenuous activity, or trauma to the lower back.  Pain started out of the blue and radiates into the abdomen at times.  She feels it is a sharp sensation.  Pain does not radiate into the legs.  She has had no urinary complaints including hematuria, dysuria, frequency, or urgency.  She does not have any history of kidney stones.  No bowel or bladder incontinence.  She denies any nausea, vomiting, diarrhea, fever, chills.  No IV drug use history or cancer history.  Pain is worse with twisting and bending forward. She currently rates her back pain mild in severity.    Back Pain     Past Medical History:  Diagnosis Date   Allergy    Anemia    Hypertension    Hypertensive retinopathy    OU   Obesity     Patient Active Problem List   Diagnosis Date Noted   Iron deficiency anemia 05/13/2020   Constipation 05/13/2020   Menorrhagia with irregular cycle 03/05/2020   FH: colon cancer in relative <3 years old 10/18/2019   Prediabetes 10/09/2014   Vitamin D deficiency 10/09/2014   Need for Tdap vaccination 10/09/2014   Thrombocytosis 08/19/2011   Essential hypertension 12/04/2009   Morbid obesity ([place]) 10/17/2007    Past Surgical History:  Procedure Laterality Date   BIOPSY  06/14/2020   Procedure: BIOPSY;  Surgeon: Harvel Quale, MD;  Location: [facility];  Service: Gastroenterology;;   COLONOSCOPY WITH PROPOFOL N/A 06/14/2020   Procedure: COLONOSCOPY WITH PROPOFOL;  Surgeon: Harvel Quale, MD;  Location: [facility];  Service: Gastroenterology;  Laterality: N/A;  8:15    ESOPHAGOGASTRODUODENOSCOPY (EGD) WITH PROPOFOL N/A 06/14/2020   Procedure: ESOPHAGOGASTRODUODENOSCOPY (EGD) WITH PROPOFOL;  Surgeon: Harvel Quale, MD;  Location: [facility];  Service: Gastroenterology;  Laterality: N/A;     OB History     Gravida  2   Para  2   Term  2   Preterm  0   AB  0   Living  2      SAB  0   IAB  0   Ectopic  0   Multiple  0   Live Births  2           Family History  Problem Relation Age of Onset   Hypertension Mother    Diabetes Father    Hypertension Father    Colon cancer Maternal Aunt    Colon cancer Cousin     Social History   Tobacco Use   Smoking status: Never   Smokeless tobacco: Never  Vaping Use   Vaping Use: Never used  Substance Use Topics   Alcohol use: Yes    Comment: occasionally   Drug use: No    Home Medications Prior to Admission medications   Medication Sig Start Date End Date Taking? Authorizing Provider  lidocaine (LIDODERM) 5 % Place 1 patch onto the skin daily. Remove & Discard patch within 12 hours or as directed by MD 04/04/21  Yes Hendricks Limes, PA-C  ferrous  sulfate 325 (65 FE) MG tablet Take 1 tablet (325 mg total) by mouth daily with breakfast. 07/03/19   Fayrene Helper, MD  tirzepatide [facility]) 2.5 MG/0.5ML Pen Inject 2.5 mg into the skin once a week. 04/01/21   Fayrene Helper, MD  tirzepatide [facility]) 5 MG/0.5ML Pen Inject 5 mg into the skin once a week. 04/09/21   Fayrene Helper, MD  triamterene-hydrochlorothiazide (MAXZIDE) 75-50 MG tablet Take 1 tablet by mouth daily. 02/19/20   Fayrene Helper, MD  Vitamin D, Ergocalciferol, (DRISDOL) 1.25 MG (50000 UNIT) CAPS capsule TAKE 1 CAPSULE (50,000 UNITS TOTAL) BY MOUTH ONCE A WEEK. 10/31/20   Fayrene Helper, MD    Allergies    Patient has no known allergies.  Review of Systems   Review of Systems  Musculoskeletal:  Positive for back pain.  All other systems reviewed and are negative.  Physical  Exam Updated Vital Signs BP 120/74   Pulse 90   Temp 98.9 F (37.2 C) (Oral)   Resp 18   SpO2 100%   Physical Exam Vitals and nursing note reviewed.  Constitutional:      Appearance: Normal appearance.  HENT:     Head: Normocephalic and atraumatic.  Eyes:     General:        Right eye: No discharge.        Left eye: No discharge.     Conjunctiva/sclera: Conjunctivae normal.  Pulmonary:     Effort: Pulmonary effort is normal.  Abdominal:     Comments: No abdominal tenderness.  Normal bowel sounds heard throughout all 4 quadrants.  Musculoskeletal:     Comments: No tenderness over the cervical, thoracic, or lumbar spine.  There is paralumbar muscular tenderness.  Negative straight leg raise bilaterally.  Skin:    General: Skin is warm and dry.     Findings: No rash.  Neurological:     General: No focal deficit present.     Mental Status: She is alert.  Psychiatric:        Mood and Affect: Mood normal.        Behavior: Behavior normal.    ED Results / Procedures / Treatments   Labs (all labs ordered are listed, but only abnormal results are displayed) Labs Reviewed  URINALYSIS, ROUTINE W REFLEX MICROSCOPIC    EKG None  Radiology No results found.  Procedures Procedures   Medications Ordered in ED Medications - No data to display  ED Course  I have reviewed the triage vital signs and the nursing notes.  Pertinent labs & imaging results that were available during my care of the patient were reviewed by me and considered in my medical decision making (see chart for details).    MDM Rules/Calculators/A&P  Rose Mcguire is a 45 y.o. female who presents to the emergency department for for evaluation of lower back pain.  Low suspicion for cauda equina at this time.  Clinically, this does not appear to be a kidney stone as it is bilateral in nature and worse with movement.  Suspect this is likely simple musculoskeletal lumbar pain and spasm.  We will obtain a  urinalysis to evaluate for possible UTI or hematuria.  Do not feel that imaging is warranted at this time.  Urinalysis was negative. I have a low suspicion for kidney stone, pyelonephritis, and cystitis.   Given the clinical scenario, I will have the patient start an ibuprofen regiment 600 mg every 6 hours for the next 3 to  5 days.  I will also write her prescription for lidocaine patches.  Strict return precautions were given.  She is safe for discharge.   Final Clinical Impression(s) / ED Diagnoses Final diagnoses:  Acute bilateral low back pain without sciatica    Rx / DC Orders ED Discharge Orders          Ordered    lidocaine (LIDODERM) 5 %  Every 24 hours        04/04/21 1704             Myna Bright [place], Hershal Coria 04/04/21 1819    Daleen Bo, MD 04/05/21 [phone]

## 2021-04-04 NOTE — Discharge Instructions (Addendum)
I believe this is likely musculoskeletal pain/spasm.  Please take 600 mg of ibuprofen every 6 hours for the next 3 to 5 days.  Please take this with food as it can irritate the stomach.  I have also given you lidocaine patches for local pain control.  I would like for you to follow-up with your primary care provider within next week to ensure we are going the right direction.  Please return to the emergency department if you experience worsening pain, numbness/weakness to your lower extremities, loss of bowel/bladder, fevers, or any other concerns you might have.

## 2021-04-09 NOTE — Progress Notes (Signed)
[place], [place] [address]   CLINIC:  Medical Oncology/Hematology  PCP:  Fayrene Helper, MD [address] [place] [address] [phone]   REASON FOR VISIT:  Follow-up for thrombocytosis and iron deficiency anemia  PRIOR THERAPY: None  CURRENT THERAPY: Oral iron supplementation  INTERVAL HISTORY:  Rose Mcguire 45 y.o. female returns for routine follow-up of her iron deficiency anemia and thrombocytosis.  She was last seen by NP Faythe Casa on 07/23/2020.  At today's visit, she reports feeling well.  No recent hospitalizations, surgeries, or changes in baseline health status.  She was prescribed oral iron supplementation with ferrous sulfate daily, but reports that she is only taking it about twice per week.  She denies any abnormal bleeding such as epistaxis, hematemesis, hematochezia, or melena.  She reports that her menstrual bleeding is normal.  She denies any fatigue, restless legs, chest pain, lightheadedness, dyspnea on exertion, or syncope.  Her previous symptoms of ice cravings have resolved.  Regarding her thrombocytopenia, she has no previous history of DVT/PE, no current signs or symptoms of VTE.  She denies any aquagenic pruritus, erythromelalgia, or vasomotor symptoms.  No B symptoms such as fever, chills, night sweats, unintentional weight loss.  She has 100% energy and 100% appetite. She endorses that she is maintaining a stable weight, but she is trying to lose weight with the assistance of Mounjaro (previously on phentermine).   REVIEW OF SYSTEMS:  Review of Systems  Constitutional:  Negative for appetite change, chills, diaphoresis, fever and unexpected weight change.  HENT:   Negative for lump/mass and nosebleeds.   Eyes:  Negative for eye problems.  Respiratory:  Negative for cough, hemoptysis and shortness of breath.   Cardiovascular:  Negative for chest pain, leg swelling and palpitations.   Gastrointestinal:  Positive for constipation. Negative for abdominal pain, blood in stool, diarrhea, nausea and vomiting.  Genitourinary:  Negative for hematuria.   Skin: Negative.   Neurological:  Negative for dizziness, headaches and light-headedness.  Hematological:  Does not bruise/bleed easily.     PAST MEDICAL/SURGICAL HISTORY:  Past Medical History:  Diagnosis Date   Allergy    Anemia    Hypertension    Hypertensive retinopathy    OU   Obesity    Past Surgical History:  Procedure Laterality Date   BIOPSY  06/14/2020   Procedure: BIOPSY;  Surgeon: Harvel Quale, MD;  [facility];  Service: Gastroenterology;;   COLONOSCOPY WITH PROPOFOL N/A 06/14/2020   Procedure: COLONOSCOPY WITH PROPOFOL;  Surgeon: Harvel Quale, MD;  [facility];  Service: Gastroenterology;  Laterality: N/A;  8:15   ESOPHAGOGASTRODUODENOSCOPY (EGD) WITH PROPOFOL N/A 06/14/2020   Procedure: ESOPHAGOGASTRODUODENOSCOPY (EGD) WITH PROPOFOL;  Surgeon: Harvel Quale, MD;  [facility];  Service: Gastroenterology;  Laterality: N/A;     SOCIAL HISTORY:  Social History   Socioeconomic History   Marital status: Soil scientist    Spouse name: Not on file   Number of children: 2   Years of education: Not on file   Highest education level: Not on file  Occupational History   Occupation: CNA     Employer: [employer]  Tobacco Use   Smoking status: Never   Smokeless tobacco: Never  Vaping Use   Vaping Use: Never used  Substance and Sexual Activity   Alcohol use: Yes    Comment: occasionally   Drug use: No   Sexual activity:  Yes    Birth control/protection: None  Other Topics Concern   Not on file  Social History Narrative   Not on file   Social Determinants of Health   Financial Resource Strain: Not on file  Food Insecurity: Not on file  Transportation Needs: Not on file  Physical Activity: Not on file  Stress: Not on file   Social Connections: Not on file  Intimate Partner Violence: Not on file    FAMILY HISTORY:  Family History  Problem Relation Age of Onset   Hypertension Mother    Diabetes Father    Hypertension Father    Colon cancer Maternal Aunt    Colon cancer Cousin     CURRENT MEDICATIONS:  Outpatient Encounter Medications as of 04/10/2021  Medication Sig   ferrous sulfate 325 (65 FE) MG tablet Take 1 tablet (325 mg total) by mouth daily with breakfast.   lidocaine (LIDODERM) 5 % Place 1 patch onto the skin daily. Remove & Discard patch within 12 hours or as directed by MD   tirzepatide [facility]) 2.5 MG/0.5ML Pen Inject 2.5 mg into the skin once a week.   tirzepatide [facility]) 5 MG/0.5ML Pen Inject 5 mg into the skin once a week.   triamterene-hydrochlorothiazide (MAXZIDE) 75-50 MG tablet Take 1 tablet by mouth daily.   Vitamin D, Ergocalciferol, (DRISDOL) 1.25 MG (50000 UNIT) CAPS capsule TAKE 1 CAPSULE (50,000 UNITS TOTAL) BY MOUTH ONCE A WEEK.   No facility-administered encounter medications on file as of 04/10/2021.    ALLERGIES:  No Known Allergies   PHYSICAL EXAM:  ECOG PERFORMANCE STATUS: 0 - Asymptomatic  There were no vitals filed for this visit. There were no vitals filed for this visit. Physical Exam Constitutional:      Appearance: Normal appearance. She is morbidly obese.  HENT:     Head: Normocephalic and atraumatic.     Mouth/Throat:     Mouth: Mucous membranes are moist.  Eyes:     Extraocular Movements: Extraocular movements intact.     Pupils: Pupils are equal, round, and reactive to light.  Cardiovascular:     Rate and Rhythm: Normal rate and regular rhythm.     Pulses: Normal pulses.     Heart sounds: Normal heart sounds.  Pulmonary:     Effort: Pulmonary effort is normal.     Breath sounds: Normal breath sounds.  Abdominal:     General: Bowel sounds are normal.     Palpations: Abdomen is soft.     Tenderness: There is no abdominal tenderness.   Musculoskeletal:        General: No swelling.     Right lower leg: No edema.     Left lower leg: No edema.  Lymphadenopathy:     Cervical: No cervical adenopathy.  Skin:    General: Skin is warm and dry.  Neurological:     General: No focal deficit present.     Mental Status: She is alert and oriented to person, place, and time.  Psychiatric:        Mood and Affect: Mood normal.        Behavior: Behavior normal.     LABORATORY DATA:  I have reviewed the labs as listed.  CBC    Component Value Date/Time   WBC 9.3 04/02/2021 1441   RBC 4.65 04/02/2021 1441   HGB 13.0 04/02/2021 1441   HGB 12.3 03/12/2021 1652   HCT 40.9 04/02/2021 1441   HCT 37.9 03/12/2021 1652   PLT 542 (  H) 04/02/2021 1441   PLT 557 (H) 03/12/2021 1652   MCV 88.0 04/02/2021 1441   MCV 83 03/12/2021 1652   MCH 28.0 04/02/2021 1441   MCHC 31.8 04/02/2021 1441   RDW 13.4 04/02/2021 1441   RDW 12.9 03/12/2021 1652   LYMPHSABS 2.8 04/02/2021 1441   MONOABS 0.5 04/02/2021 1441   EOSABS 0.1 04/02/2021 1441   BASOSABS 0.1 04/02/2021 1441   CMP Latest Ref Rng & Units 04/02/2021 03/12/2021 09/20/2020  Glucose 70 - 99 mg/dL 112(H) 120(H) 102(H)  BUN 6 - 20 mg/dL _0 Creatinine 0.44 - 1.00 mg/dL 0.94 1.00 0.84  Sodium 135 - 145 mmol/L 133(L) 138 136  Potassium 3.5 - 5.1 mmol/L 3.4(L) 4.0 4.1  Chloride 98 - 111 mmol/L 96(L) 98 94(L)  CO2 22 - 32 mmol/L _1 Calcium 8.9 - 10.3 mg/dL 9.5 9.9 10.1  Total Protein 6.5 - 8.1 g/dL 8.5(H) - -  Total Bilirubin 0.3 - 1.2 mg/dL 0.4 - -  Alkaline Phos 38 - 126 U/L 84 - -  AST 15 - 41 U/L 14(L) - -  ALT 0 - 44 U/L 13 - -    DIAGNOSTIC IMAGING:  I have independently reviewed the relevant imaging and discussed with the patient.  ASSESSMENT & PLAN: 1.  Thrombocytosis - Patient seen at the request of Dr. Moshe Cipro for isolated thrombocytosis since 2008. - Her ferritin on 02/21/2020 was 47.  Patient was told to start taking iron tablet since then. - She does  not have any history of VTE.  No history of connective tissue disorders or autoimmune conditions. - She is on phentermine for few months and has lost 24 pounds. - Her ANA, lupus anticoagulant and rheumatoid factor was negative but her ESR and CRP were slightly elevated. - JAK2 with reflex mutation was negative. - No vasomotor symptoms, erythromelalgia's, or aquagenic pruritus.  No B symptoms. - Most recent labs (04/02/2021): Platelets 542, ferritin 39, iron saturation 11% - Thrombocytosis suspected to be reactive secondary to her iron deficiency and obesity.   - PLAN: Iron repletion as below.  We will recheck CBC in 6 months.    2.  Iron deficiency anemia: - She was started on iron supplements since October 2021 and has been tolerating well. - EGD/colonoscopy on 06/14/2020 which showed normal esophagus and few gastric polyps.  Colonoscopy showed diverticulosis and nonbleeding internal hemorrhoids.  No specimens were collected. - Was prescribed ferrous sulfate daily, but has only been taking it about twice per week. - Denies any active bleeding.  Denies heavy menstrual cycles.  - Most recent labs (04/02/2021): Hgb 13.0/MCV 88.0, ferritin 39, iron saturation 11%. - PLAN: Discussed with patient the importance of compliance with daily ferrous sulfate, advised to take in the morning along with a glass of orange juice.  We discussed that if her iron levels do not improve, she may require IV iron supplementation. - Repeat CBC and iron panel in 6 months.    3.  Constipation: - Secondary to iron tablets. - PLAN: Recommended daily Colace with as needed MiraLAX  4.  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  She is a non-smoker. - Maternal aunt had pancreatic cancer.  Maternal uncle had throat cancer and a maternal cousin has colon cancer   PLAN SUMMARY & DISPOSITION: Labs and RTC in 6 months  All questions were answered. The patient  knows to call the clinic with any problems,  questions or concerns.  Medical decision making: Low  Time spent on visit: I spent 20 minutes counseling the patient face to face. The total time spent in the appointment was 30 minutes and more than 50% was on counseling.   Harriett Rush, PA-C  04/10/2021 8:46 AM

## 2021-04-10 ENCOUNTER — Inpatient Hospital Stay (HOSPITAL_COMMUNITY): Payer: BC Managed Care – PPO | Attending: Hematology | Admitting: Physician Assistant

## 2021-04-10 ENCOUNTER — Other Ambulatory Visit: Payer: Self-pay

## 2021-04-10 ENCOUNTER — Encounter (HOSPITAL_COMMUNITY): Payer: Self-pay | Admitting: Physician Assistant

## 2021-04-10 VITALS — BP 147/96 | HR 103 | Temp 99.2°F | Resp 18 | Ht 64.96 in | Wt 268.1 lb

## 2021-04-10 DIAGNOSIS — Z833 Family history of diabetes mellitus: Secondary | ICD-10-CM | POA: Insufficient documentation

## 2021-04-10 DIAGNOSIS — D75838 Other thrombocytosis: Secondary | ICD-10-CM | POA: Diagnosis not present

## 2021-04-10 DIAGNOSIS — D75839 Thrombocytosis, unspecified: Secondary | ICD-10-CM | POA: Diagnosis not present

## 2021-04-10 DIAGNOSIS — Z8249 Family history of ischemic heart disease and other diseases of the circulatory system: Secondary | ICD-10-CM | POA: Insufficient documentation

## 2021-04-10 DIAGNOSIS — Z79899 Other long term (current) drug therapy: Secondary | ICD-10-CM | POA: Diagnosis not present

## 2021-04-10 DIAGNOSIS — Z800 Family history of malignant neoplasm of digestive organs: Secondary | ICD-10-CM | POA: Insufficient documentation

## 2021-04-10 DIAGNOSIS — D509 Iron deficiency anemia, unspecified: Secondary | ICD-10-CM | POA: Insufficient documentation

## 2021-04-10 DIAGNOSIS — K5900 Constipation, unspecified: Secondary | ICD-10-CM | POA: Diagnosis not present

## 2021-04-10 DIAGNOSIS — I10 Essential (primary) hypertension: Secondary | ICD-10-CM | POA: Insufficient documentation

## 2021-04-10 DIAGNOSIS — K648 Other hemorrhoids: Secondary | ICD-10-CM | POA: Insufficient documentation

## 2021-04-10 DIAGNOSIS — E669 Obesity, unspecified: Secondary | ICD-10-CM | POA: Diagnosis not present

## 2021-04-10 DIAGNOSIS — K317 Polyp of stomach and duodenum: Secondary | ICD-10-CM | POA: Diagnosis not present

## 2021-04-10 NOTE — Patient Instructions (Signed)
[facility] Home at [facility] Discharge Instructions  You were seen today by Tarri Abernethy PA-C for your elevated platelets and your iron deficiency.  IRON DEFICIENCY: Make sure that you take your iron pill once daily.  It is best absorbed when taken in the morning with a glass of orange juice.  We will check your levels again in 6 months, and if they are still low, we will consider using IV iron.  ELEVATED PLATELETS: This may be related to your low iron, and also may be related to inflammation from obesity.  We will continue to monitor these at follow-up appointments.  LABS: Return in 6 months for repeat labs  OTHER TESTS: No other tests at this time  MEDICATIONS: Iron tablet once daily  FOLLOW-UP APPOINTMENT: Office visit in 6 months   Thank you for choosing [place] at [facility] to provide your oncology and hematology care.  To afford each patient quality time with our provider, please arrive at least 15 minutes before your scheduled appointment time.   If you have a lab appointment with the [facility] please come in thru the Main Entrance and check in at the main information desk.  You need to re-schedule your appointment should you arrive 10 or more minutes late.  We strive to give you quality time with our providers, and arriving late affects you and other patients whose appointments are after yours.  Also, if you no show three or more times for appointments you may be dismissed from the clinic at the providers discretion.     Again, thank you for choosing [facility].  Our hope is that these requests will decrease the amount of time that you wait before being seen by our physicians.       _____________________________________________________________  Should you have questions after your visit to [facility], please contact our office at [phone] and follow the prompts.  Our office hours are 8:00  a.m. and 4:30 p.m. Monday - Friday.  Please note that voicemails left after 4:00 p.m. may not be returned until the following business day.  We are closed weekends and major holidays.  You do have access to a nurse 24-7, just call the main number to the clinic [phone] and do not press any options, hold on the line and a nurse will answer the phone.    For prescription refill requests, have your pharmacy contact our office and allow 72 hours.    Due to Covid, you will need to wear a mask upon entering the hospital. If you do not have a mask, a mask will be given to you at the Main Entrance upon arrival. For doctor visits, patients may have 1 support person age 7 or older with them. For treatment visits, patients can not have anyone with them due to social distancing guidelines and our immunocompromised population.

## 2021-04-13 ENCOUNTER — Other Ambulatory Visit: Payer: Self-pay | Admitting: Family Medicine

## 2021-05-09 ENCOUNTER — Telehealth: Payer: Self-pay

## 2021-05-09 ENCOUNTER — Other Ambulatory Visit: Payer: Self-pay | Admitting: *Deleted

## 2021-05-09 MED ORDER — TIRZEPATIDE 5 MG/0.5ML ~~LOC~~ SOAJ: 5.0000 mg | SUBCUTANEOUS | 1 refills | Status: DC

## 2021-05-09 NOTE — Telephone Encounter (Signed)
Patient need med refill on [facility] not sure which mg she needs.   Pharmacy: [facility]

## 2021-05-09 NOTE — Telephone Encounter (Signed)
Medication sent to pharmacy  

## 2021-05-22 ENCOUNTER — Encounter: Payer: Self-pay | Admitting: Family Medicine

## 2021-05-22 ENCOUNTER — Other Ambulatory Visit: Payer: Self-pay

## 2021-05-22 ENCOUNTER — Ambulatory Visit: Payer: BC Managed Care – PPO | Admitting: Family Medicine

## 2021-05-22 VITALS — BP 134/81 | HR 100 | Resp 16 | Ht 65.0 in | Wt 264.0 lb

## 2021-05-22 DIAGNOSIS — Z13220 Encounter for screening for lipoid disorders: Secondary | ICD-10-CM | POA: Diagnosis not present

## 2021-05-22 DIAGNOSIS — E559 Vitamin D deficiency, unspecified: Secondary | ICD-10-CM

## 2021-05-22 DIAGNOSIS — I10 Essential (primary) hypertension: Secondary | ICD-10-CM | POA: Diagnosis not present

## 2021-05-22 DIAGNOSIS — R7303 Prediabetes: Secondary | ICD-10-CM | POA: Diagnosis not present

## 2021-05-22 DIAGNOSIS — K5903 Drug induced constipation: Secondary | ICD-10-CM

## 2021-05-22 DIAGNOSIS — D509 Iron deficiency anemia, unspecified: Secondary | ICD-10-CM | POA: Diagnosis not present

## 2021-05-22 MED ORDER — TIRZEPATIDE 5 MG/0.5ML ~~LOC~~ SOAJ: 5.0000 mg | SUBCUTANEOUS | 0 refills | Status: DC

## 2021-05-22 NOTE — Patient Instructions (Addendum)
F/U in 10 weeks, call if ylou need me sooner  Congrats on EXCELLENT weight loss , keep it up!!!   It is important that you exercise regularly at least 30 minutes 5 times a week. If you develop chest pain, have severe difficulty breathing, or feel very tired, stop exercising immediately and seek medical attention   No med changes  cBC, iron, lipid, cmp and EGFr, and vit D today  Thanks for choosing [facility], we consider it a privelige to serve you.

## 2021-05-22 NOTE — Assessment & Plan Note (Signed)
Updated lab needed at/ before next visit.   

## 2021-05-22 NOTE — Assessment & Plan Note (Signed)
Resolved on mounjaro

## 2021-05-22 NOTE — Assessment & Plan Note (Signed)
Excellent response to mounjaro, continue same  Patient re-educated about  the importance of commitment to a  minimum of 150 minutes of exercise per week as able.  The importance of healthy food choices with portion control discussed, as well as eating regularly and within a 12 hour window most days. The need to choose "clean , green" food 50 to 75% of the time is discussed, as well as to make water the primary drink and set a goal of 64 ounces water daily.    Weight /BMI 05/22/2021 04/10/2021 03/12/2021  WEIGHT 264 lb 268 lb 1.3 oz 277 lb 1.9 oz  HEIGHT 5\' 5"  5' 4.961" 5\' 4"   BMI 43.93 kg/m2 44.66 kg/m2 47.57 kg/m2   '

## 2021-05-22 NOTE — Assessment & Plan Note (Signed)
Controlled, no change in medication DASH diet and commitment to daily physical activity for a minimum of 30 minutes discussed and encouraged, as a part of hypertension management. The importance of attaining a healthy weight is also discussed.  BP/Weight 05/22/2021 04/10/2021 04/04/2021 03/12/2021 09/25/2020 07/23/2020 06/24/2667  Systolic BP 167 561 254 832 346 887 373  Diastolic BP 81 96 74 78 92 88 78  Wt. (Lbs) 264 268.08 - 277.12 264 - 266  BMI 43.93 44.66 - 47.57 45.32 - 45.66

## 2021-05-22 NOTE — Progress Notes (Signed)
° °  Rose Mcguire     MRN: [id number]      DOB: October 08, 1975   HPI Ms. Overbay is here for follow up and re-evaluation of chronic medical conditions, medication management and review of any available recent lab and radiology data.  Preventive health is updated, specifically  Cancer screening and Immunization.   Questions or concerns regarding consultations or procedures which the PT has had in the interim are  addressed. The PT denies any adverse reactions to current medications since the last visit.  There are no new concerns.  There are no specific complaints   ROS Denies recent fever or chills. Denies sinus pressure, nasal congestion, ear pain or sore throat. Denies chest congestion, productive cough or wheezing. Denies chest pains, palpitations and leg swelling Denies abdominal pain, nausea, vomiting,diarrhea or constipation.   Denies dysuria, frequency, hesitancy or incontinence. Denies joint pain, swelling and limitation in mobility. Denies headaches, seizures, numbness, or tingling. Denies depression, anxiety or insomnia. Denies skin break down or rash.   PE  BP 134/81    Pulse 100    Resp 16    Ht 5\' 5"  (1.651 m)    Wt 264 lb (119.7 kg)    SpO2 98%    BMI 43.93 kg/m   Patient alert and oriented and in no cardiopulmonary distress.  HEENT: No facial asymmetry, EOMI,     Neck supple .  Chest: Clear to auscultation bilaterally.  CVS: S1, S2 no murmurs, no S3.Regular rate.  ABD: Soft non tender.   Ext: No edema  MS: Adequate ROM spine, shoulders, hips and knees.  Skin: Intact, no ulcerations or rash noted.  Psych: Good eye contact, normal affect. Memory intact not anxious or depressed appearing.  CNS: CN 2-12 intact, power,  normal throughout.no focal deficits noted.   Assessment & Plan  Essential hypertension Controlled, no change in medication DASH diet and commitment to daily physical activity for a minimum of 30 minutes discussed and encouraged, as a part  of hypertension management. The importance of attaining a healthy weight is also discussed.  BP/Weight 05/22/2021 04/10/2021 04/04/2021 03/12/2021 09/25/2020 07/23/2020 07/17/1759  Systolic BP 607 371 062 694 854 627 035  Diastolic BP 81 96 74 78 92 88 78  Wt. (Lbs) 264 268.08 - 277.12 264 - 266  BMI 43.93 44.66 - 47.57 45.32 - 45.66       Constipation Resolved on mounjaro  Morbid obesity Excellent response to mounjaro, continue same  Patient re-educated about  the importance of commitment to a  minimum of 150 minutes of exercise per week as able.  The importance of healthy food choices with portion control discussed, as well as eating regularly and within a 12 hour window most days. The need to choose "clean , green" food 50 to 75% of the time is discussed, as well as to make water the primary drink and set a goal of 64 ounces water daily.    Weight /BMI 05/22/2021 04/10/2021 03/12/2021  WEIGHT 264 lb 268 lb 1.3 oz 277 lb 1.9 oz  HEIGHT 5\' 5"  5' 4.961" 5\' 4"   BMI 43.93 kg/m2 44.66 kg/m2 47.57 kg/m2   '  Iron deficiency anemia Updated lab needed at/ before next visit.

## 2021-05-23 ENCOUNTER — Encounter: Payer: Self-pay | Admitting: Family Medicine

## 2021-05-23 LAB — CBC
Hematocrit: 38.8 % (ref 34.0–46.6)
Hemoglobin: 12.2 g/dL (ref 11.1–15.9)
MCH: 26.3 pg — ABNORMAL LOW (ref 26.6–33.0)
MCHC: 31.4 g/dL — ABNORMAL LOW (ref 31.5–35.7)
MCV: 84 fL (ref 79–97)
Platelets: 562 10*3/uL — ABNORMAL HIGH (ref 150–450)
RBC: 4.63 x10E6/uL (ref 3.77–5.28)
RDW: 12.7 % (ref 11.7–15.4)
WBC: 9 10*3/uL (ref 3.4–10.8)

## 2021-05-23 LAB — CMP14+EGFR
ALT: 11 IU/L (ref 0–32)
AST: 9 IU/L (ref 0–40)
Albumin/Globulin Ratio: 1.3 (ref 1.2–2.2)
Albumin: 4.5 g/dL (ref 3.8–4.8)
Alkaline Phosphatase: 90 IU/L (ref 44–121)
BUN/Creatinine Ratio: 12 (ref 9–23)
BUN: 12 mg/dL (ref 6–24)
Bilirubin Total: 0.3 mg/dL (ref 0.0–1.2)
CO2: 27 mmol/L (ref 20–29)
Calcium: 10.2 mg/dL (ref 8.7–10.2)
Chloride: 97 mmol/L (ref 96–106)
Creatinine, Ser: 1.03 mg/dL — ABNORMAL HIGH (ref 0.57–1.00)
Globulin, Total: 3.6 g/dL (ref 1.5–4.5)
Glucose: 89 mg/dL (ref 70–99)
Potassium: 4.3 mmol/L (ref 3.5–5.2)
Sodium: 140 mmol/L (ref 134–144)
Total Protein: 8.1 g/dL (ref 6.0–8.5)
eGFR: 69 mL/min/{1.73_m2} (ref >59)

## 2021-05-23 LAB — LIPID PANEL
Chol/HDL Ratio: 2.6 ratio (ref 0.0–4.4)
Cholesterol, Total: 158 mg/dL (ref 100–199)
HDL: 61 mg/dL (ref >39)
LDL Chol Calc (NIH): 87 mg/dL (ref 0–99)
Triglycerides: 45 mg/dL (ref 0–149)
VLDL Cholesterol Cal: 10 mg/dL (ref 5–40)

## 2021-05-23 LAB — VITAMIN D 25 HYDROXY (VIT D DEFICIENCY, FRACTURES): Vit D, 25-Hydroxy: 57.4 ng/mL (ref 30.0–100.0)

## 2021-05-23 LAB — IRON: Iron: 39 ug/dL (ref 27–159)

## 2021-05-27 ENCOUNTER — Other Ambulatory Visit: Payer: Self-pay

## 2021-05-27 ENCOUNTER — Encounter: Payer: Self-pay | Admitting: Internal Medicine

## 2021-05-27 ENCOUNTER — Ambulatory Visit: Payer: BC Managed Care – PPO | Admitting: Internal Medicine

## 2021-05-27 DIAGNOSIS — R195 Other fecal abnormalities: Secondary | ICD-10-CM | POA: Diagnosis not present

## 2021-05-27 DIAGNOSIS — R110 Nausea: Secondary | ICD-10-CM

## 2021-05-27 MED ORDER — METOCLOPRAMIDE HCL 5 MG PO TABS: 5.0000 mg | ORAL_TABLET | Freq: Three times a day (TID) | ORAL | 0 refills | Status: DC | PRN

## 2021-05-27 NOTE — Progress Notes (Signed)
Virtual Visit via Telephone Note   This visit type was conducted due to national recommendations for restrictions regarding the COVID-19 Pandemic (e.g. social distancing) in an effort to limit this patient's exposure and mitigate transmission in our community.  Due to her co-morbid illnesses, this patient is at least at moderate risk for complications without adequate follow up.  This format is felt to be most appropriate for this patient at this time.  The patient did not have access to video technology/had technical difficulties with video requiring transitioning to audio format only (telephone).  All issues noted in this document were discussed and addressed.  No physical exam could be performed with this format.  Evaluation Performed:  Follow-up visit  Date:  05/27/2021   ID:  Rose Mcguire, DOB Aug 04, 1975, MRN [id number]  Patient Location: Home Provider Location: Office/Clinic  Participants: Patient Location of Patient: Home Location of Provider: Telehealth Consent was obtain for visit to be over via telehealth. I verified that I am speaking with the correct person using two identifiers.  PCP:  Fayrene Helper, MD   Chief Complaint: Nausea and loose stools  History of Present Illness:    Rose Mcguire is a 46 y.o. female who has a televisit for complaint of nausea and loose stools for the last 2 days.  Of note, her dose of Mounjaro was increased to 5 mg in the last week.  She also reports gassy sensation.  She denies any vomiting.  She denies any fever, chills, melena or hematochezia currently.  The patient does not have symptoms concerning for COVID-19 infection (fever, chills, cough, or new shortness of breath).   Past Medical, Surgical, Social History, Allergies, and Medications have been Reviewed.  Past Medical History:  Diagnosis Date   Allergy    Anemia    Hypertension    Hypertensive retinopathy    OU   Obesity    Past Surgical History:  Procedure  Laterality Date   BIOPSY  06/14/2020   Procedure: BIOPSY;  Surgeon: Harvel Quale, MD;  [facility];  Service: Gastroenterology;;   COLONOSCOPY WITH PROPOFOL N/A 06/14/2020   Procedure: COLONOSCOPY WITH PROPOFOL;  Surgeon: Harvel Quale, MD;  [facility];  Service: Gastroenterology;  Laterality: N/A;  8:15   ESOPHAGOGASTRODUODENOSCOPY (EGD) WITH PROPOFOL N/A 06/14/2020   Procedure: ESOPHAGOGASTRODUODENOSCOPY (EGD) WITH PROPOFOL;  Surgeon: Harvel Quale, MD;  [facility];  Service: Gastroenterology;  Laterality: N/A;     Current Meds  Medication Sig   docusate sodium (COLACE) 100 MG capsule Take 100 mg by mouth 2 (two) times daily as needed for mild constipation.   ferrous sulfate 325 (65 FE) MG tablet Take 1 tablet (325 mg total) by mouth daily with breakfast.   metoCLOPramide (REGLAN) 5 MG tablet Take 1 tablet (5 mg total) by mouth every 8 (eight) hours as needed for nausea.   tirzepatide [facility]) 5 MG/0.5ML Pen Inject 5 mg into the skin once a week.   [START ON 06/06/2021] tirzepatide (MOUNJARO) 5 MG/0.5ML Pen Inject 5 mg into the skin once a week.   triamterene-hydrochlorothiazide (MAXZIDE) 75-50 MG tablet TAKE 1 TABLET BY MOUTH EVERY DAY   Vitamin D, Ergocalciferol, (DRISDOL) 1.25 MG (50000 UNIT) CAPS capsule TAKE 1 CAPSULE BY MOUTH ONE TIME PER WEEK     Allergies:   Patient has no known allergies.   ROS:   Please see the history of present illness.     All other systems  reviewed and are negative.   Labs/Other Tests and Data Reviewed:    Recent Labs: 03/12/2021: TSH 2.800 05/22/2021: ALT 11; BUN 12; Creatinine, Ser 1.03; Hemoglobin 12.2; Platelets 562; Potassium 4.3; Sodium 140   Recent Lipid Panel Lab Results  Component Value Date/Time   CHOL 158 05/22/2021 08:57 AM   TRIG 45 05/22/2021 08:57 AM   HDL 61 05/22/2021 08:57 AM   CHOLHDL 2.6 05/22/2021 08:57 AM   CHOLHDL 2.8 06/26/2019 09:46 AM   LDLCALC 87  05/22/2021 08:57 AM   LDLCALC 96 06/26/2019 09:46 AM    Wt Readings from Last 3 Encounters:  05/22/21 264 lb (119.7 kg)  04/10/21 268 lb 1.3 oz (121.6 kg)  03/12/21 277 lb 1.9 oz (125.7 kg)     ASSESSMENT & PLAN:    Nausea Loose BM Nausea and belching could be related to [employer] Advised to follow small, frequent meals Reglan as needed for nausea, could be related to gastroparesis from [facility] Unclear etiology of loose BM, advised to maintain adequate hydration for now -if persistent, will have to hold Mounjaro  Time:   Today, I have spent 13 minutes reviewing the chart, including problem list, medications, and with the patient with telehealth technology discussing the above problems.   Medication Adjustments/Labs and Tests Ordered: Current medicines are reviewed at length with the patient today.  Concerns regarding medicines are outlined above.   Tests Ordered: No orders of the defined types were placed in this encounter.   Medication Changes: Meds ordered this encounter  Medications   metoCLOPramide (REGLAN) 5 MG tablet    Sig: Take 1 tablet (5 mg total) by mouth every 8 (eight) hours as needed for nausea.    Dispense:  20 tablet    Refill:  0     Note: This dictation was prepared with Dragon dictation along with smaller phrase technology. Similar sounding words can be transcribed inadequately or may not be corrected upon review. Any transcriptional errors that result from this process are unintentional.      Disposition:  Follow up  Signed, Lindell Spar, MD  05/27/2021 3:53 PM     [facility]

## 2021-05-27 NOTE — Patient Instructions (Signed)
Please take Reglan as needed for nausea.  Please follow small, frequent meals.  Please maintain adequate hydration by taking at least 64 ounces of fluid in a day.  Please contact us if you continue to have loose BM or watery diarrhea.

## 2021-07-25 ENCOUNTER — Ambulatory Visit: Payer: BC Managed Care – PPO | Admitting: Family Medicine

## 2021-07-25 ENCOUNTER — Encounter: Payer: Self-pay | Admitting: Family Medicine

## 2021-07-25 ENCOUNTER — Other Ambulatory Visit: Payer: Self-pay

## 2021-07-25 VITALS — BP 125/86 | HR 110 | Ht 64.0 in | Wt 272.0 lb

## 2021-07-25 DIAGNOSIS — I10 Essential (primary) hypertension: Secondary | ICD-10-CM | POA: Diagnosis not present

## 2021-07-25 DIAGNOSIS — R7303 Prediabetes: Secondary | ICD-10-CM

## 2021-07-25 LAB — POCT GLYCOSYLATED HEMOGLOBIN (HGB A1C): HbA1c, POC (prediabetic range): 5.9 % (ref 5.7–6.4)

## 2021-07-25 NOTE — Patient Instructions (Addendum)
F/U in 10 weeks, call if you need me sooner ? ?Weight loss goal of 10 to 16 pounds, "the 2 piece" ? ?It is important that you exercise regularly at least 30 minutes 5 times a week. If you develop chest pain, have severe difficulty breathing, or feel very tired, stop exercising immediately and seek medical attention  ? ?Thanks for choosing [facility], we consider it a privelige to serve you. ? ? ?

## 2021-07-25 NOTE — Progress Notes (Signed)
? ?Rose Mcguire     MRN: [id number]      DOB: 27-Sep-1975 ? ? ?HPI ?Ms. Puccio is here for follow up and re-evaluation of chronic medical conditions, medication management and review of any available recent lab and radiology data.  ?Preventive health is updated, specifically  Cancer screening and Immunization.   ?Questions or concerns regarding consultations or procedures which the PT has had in the interim are  addressed. ?The PT denies any adverse reactions to current medications since the last visit.  ?There are no new concerns.  ?There are no specific complaints  ? ?ROS ?Denies recent fever or chills. ?Denies sinus pressure, nasal congestion, ear pain or sore throat. ?Denies chest congestion, productive cough or wheezing. ?Denies chest pains, palpitations and leg swelling ?Denies abdominal pain, nausea, vomiting,diarrhea or constipation.   ?Denies dysuria, frequency, hesitancy or incontinence. ?Denies joint pain, swelling and limitation in mobility. ?Denies headaches, seizures, numbness, or tingling. ?Denies depression, anxiety or insomnia. ?Denies skin break down or rash. ? ? ?PE ? ?BP 125/86 (BP Location: Left Arm)   Pulse (!) 110   Ht '5\' 4"'$  (1.626 m)   Wt 272 lb (123.4 kg)   LMP 07/20/2021   SpO2 97%   BMI 46.69 kg/m?  ? ?Patient alert and oriented and in no cardiopulmonary distress. ? ?HEENT: No facial asymmetry, EOMI,     Neck supple . ? ?Chest: Clear to auscultation bilaterally. ? ?CVS: S1, S2 no murmurs, no S3.Regular rate. ? ?ABD: Soft non tender.  ? ?Ext: No edema ? ?MS: Adequate ROM spine, shoulders, hips and knees. ? ?Skin: Intact, no ulcerations or rash noted. ? ?Psych: Good eye contact, normal affect. Memory intact not anxious or depressed appearing. ? ?CNS: CN 2-12 intact, power,  normal throughout.no focal deficits noted. ? ? ?Assessment & Plan ? ?Essential hypertension ?Controlled, no change in medication ?DASH diet and commitment to daily physical activity for a minimum of 30 minutes  discussed and encouraged, as a part of hypertension management. ?The importance of attaining a healthy weight is also discussed. ? ? ?  07/25/2021  ?  9:14 AM 05/22/2021  ?  8:24 AM 04/10/2021  ?  8:02 AM 04/04/2021  ?  4:59 PM 04/04/2021  ?  2:40 PM 03/12/2021  ?  4:40 PM 03/12/2021  ?  4:15 PM  ?BP/Weight  ?Systolic BP 212 248 250 037 140 136 141  ?Diastolic BP 86 81 96 74 99 78 79  ?Wt. (Lbs) 272 264 268.08    277.12  ?BMI 46.69 kg/m2 43.93 kg/m2 44.66 kg/m2    47.57 kg/m2  ? ? ? ? ? ?Morbid obesity ? ?Patient re-educated about  the importance of commitment to a  minimum of 150 minutes of exercise per week as able. ? ?The importance of healthy food choices with portion control discussed, as well as eating regularly and within a 12 hour window most days. ?The need to choose "clean , green" food 50 to 75% of the time is discussed, as well as to make water the primary drink and set a goal of 64 ounces water daily. ? ?  ? ?  07/25/2021  ?  9:14 AM 05/22/2021  ?  8:24 AM 04/10/2021  ?  8:02 AM  ?Weight /BMI  ?Weight 272 lb 264 lb 268 lb 1.3 oz  ?Height '5\' 4"'$  (1.626 m) '5\' 5"'$  (1.651 m) 5' 4.96" (1.65 m)  ?BMI 46.69 kg/m2 43.93 kg/m2 44.66 kg/m2  ? ? ?Needs to stop drinking sweet  tea, negating appetite suppression ? ?Prediabetes ?Patient educated about the importance of limiting  Carbohydrate intake , the need to commit to daily physical activity for a minimum of 30 minutes , and to commit weight loss. ?The fact that changes in all these areas will reduce or eliminate all together the development of diabetes is stressed.  ?Improved, which is good ? ?  Latest Ref Rng & Units 07/25/2021  ? 10:13 AM 05/22/2021  ?  8:57 AM 04/02/2021  ?  2:41 PM 03/12/2021  ?  4:52 PM 03/12/2021  ?  4:49 PM  ?Diabetic Labs  ?HbA1c 5.7 - 6.4 % 5.9      6.0    ?Chol 100 - 199 mg/dL  158       ?HDL >39 mg/dL  61       ?Calc LDL 0 - 99 mg/dL  87       ?Triglycerides 0 - 149 mg/dL  45       ?Creatinine 0.57 - 1.00 mg/dL  1.03   0.94   1.00     ? ? ?  07/25/2021   ?  9:14 AM 05/22/2021  ?  8:24 AM 04/10/2021  ?  8:02 AM 04/04/2021  ?  4:59 PM 04/04/2021  ?  2:40 PM 03/12/2021  ?  4:40 PM 03/12/2021  ?  4:15 PM  ?BP/Weight  ?Systolic BP 810 175 102 585 140 136 141  ?Diastolic BP 86 81 96 74 99 78 79  ?Wt. (Lbs) 272 264 268.08    277.12  ?BMI 46.69 kg/m2 43.93 kg/m2 44.66 kg/m2    47.57 kg/m2  ? ?   ? View : No data to display.  ?  ?  ?  ? ? ? ? ?

## 2021-07-25 NOTE — Assessment & Plan Note (Signed)
Controlled, no change in medication ?DASH diet and commitment to daily physical activity for a minimum of 30 minutes discussed and encouraged, as a part of hypertension management. ?The importance of attaining a healthy weight is also discussed. ? ? ?  07/25/2021  ?  9:14 AM 05/22/2021  ?  8:24 AM 04/10/2021  ?  8:02 AM 04/04/2021  ?  4:59 PM 04/04/2021  ?  2:40 PM 03/12/2021  ?  4:40 PM 03/12/2021  ?  4:15 PM  ?BP/Weight  ?Systolic BP 007 121 975 883 140 136 141  ?Diastolic BP 86 81 96 74 99 78 79  ?Wt. (Lbs) 272 264 268.08    277.12  ?BMI 46.69 kg/m2 43.93 kg/m2 44.66 kg/m2    47.57 kg/m2  ? ? ? ? ?

## 2021-07-25 NOTE — Assessment & Plan Note (Signed)
Patient educated about the importance of limiting  Carbohydrate intake , the need to commit to daily physical activity for a minimum of 30 minutes , and to commit weight loss. ?The fact that changes in all these areas will reduce or eliminate all together the development of diabetes is stressed.  ?Improved, which is good ? ?  Latest Ref Rng & Units 07/25/2021  ? 10:13 AM 05/22/2021  ?  8:57 AM 04/02/2021  ?  2:41 PM 03/12/2021  ?  4:52 PM 03/12/2021  ?  4:49 PM  ?Diabetic Labs  ?HbA1c 5.7 - 6.4 % 5.9      6.0    ?Chol 100 - 199 mg/dL  158       ?HDL >39 mg/dL  61       ?Calc LDL 0 - 99 mg/dL  87       ?Triglycerides 0 - 149 mg/dL  45       ?Creatinine 0.57 - 1.00 mg/dL  1.03   0.94   1.00     ? ? ?  07/25/2021  ?  9:14 AM 05/22/2021  ?  8:24 AM 04/10/2021  ?  8:02 AM 04/04/2021  ?  4:59 PM 04/04/2021  ?  2:40 PM 03/12/2021  ?  4:40 PM 03/12/2021  ?  4:15 PM  ?BP/Weight  ?Systolic BP 244 010 272 536 140 136 141  ?Diastolic BP 86 81 96 74 99 78 79  ?Wt. (Lbs) 272 264 268.08    277.12  ?BMI 46.69 kg/m2 43.93 kg/m2 44.66 kg/m2    47.57 kg/m2  ? ?   ? View : No data to display.  ?  ?  ?  ? ? ? ?

## 2021-07-25 NOTE — Assessment & Plan Note (Signed)
?  Patient re-educated about  the importance of commitment to a  minimum of 150 minutes of exercise per week as able. ? ?The importance of healthy food choices with portion control discussed, as well as eating regularly and within a 12 hour window most days. ?The need to choose "clean , green" food 50 to 75% of the time is discussed, as well as to make water the primary drink and set a goal of 64 ounces water daily. ? ?  ? ?  07/25/2021  ?  9:14 AM 05/22/2021  ?  8:24 AM 04/10/2021  ?  8:02 AM  ?Weight /BMI  ?Weight 272 lb 264 lb 268 lb 1.3 oz  ?Height '5\' 4"'$  (1.626 m) '5\' 5"'$  (1.651 m) 5' 4.96" (1.65 m)  ?BMI 46.69 kg/m2 43.93 kg/m2 44.66 kg/m2  ? ? ?Needs to stop drinking sweet tea, negating appetite suppression ?

## 2021-09-08 ENCOUNTER — Telehealth: Payer: Self-pay | Admitting: Family Medicine

## 2021-09-08 ENCOUNTER — Other Ambulatory Visit: Payer: Self-pay | Admitting: Family Medicine

## 2021-09-08 NOTE — Telephone Encounter (Signed)
Patient called in for Pre Auth on [place] at [facility]  ?

## 2021-09-10 ENCOUNTER — Other Ambulatory Visit: Payer: Self-pay | Admitting: Family Medicine

## 2021-09-10 MED ORDER — SEMAGLUTIDE-WEIGHT MANAGEMENT 0.25 MG/0.5ML ~~LOC~~ SOAJ
0.2500 mg | SUBCUTANEOUS | 0 refills | Status: DC
Start: 2021-09-10 — End: 2021-10-03

## 2021-09-10 MED ORDER — SEMAGLUTIDE-WEIGHT MANAGEMENT 0.5 MG/0.5ML ~~LOC~~ SOAJ
0.5000 mg | SUBCUTANEOUS | 1 refills | Status: DC
Start: 2021-10-08 — End: 2021-10-03

## 2021-09-10 NOTE — Telephone Encounter (Signed)
Patient aware.

## 2021-10-03 ENCOUNTER — Encounter: Payer: Self-pay | Admitting: Family Medicine

## 2021-10-03 ENCOUNTER — Ambulatory Visit: Payer: BC Managed Care – PPO | Admitting: Family Medicine

## 2021-10-03 VITALS — BP 130/84 | HR 98 | Temp 99.0°F | Resp 16 | Ht 65.0 in | Wt 262.1 lb

## 2021-10-03 DIAGNOSIS — I10 Essential (primary) hypertension: Secondary | ICD-10-CM | POA: Diagnosis not present

## 2021-10-03 DIAGNOSIS — J020 Streptococcal pharyngitis: Secondary | ICD-10-CM | POA: Diagnosis not present

## 2021-10-03 DIAGNOSIS — J029 Acute pharyngitis, unspecified: Secondary | ICD-10-CM | POA: Diagnosis not present

## 2021-10-03 LAB — POCT RAPID STREP A (OFFICE): Rapid Strep A Screen: POSITIVE — AB

## 2021-10-03 MED ORDER — PROMETHAZINE-DM 6.25-15 MG/5ML PO SYRP: ORAL_SOLUTION | ORAL | 0 refills | Status: DC

## 2021-10-03 MED ORDER — SEMAGLUTIDE-WEIGHT MANAGEMENT 1 MG/0.5ML ~~LOC~~ SOAJ
1.0000 mg | SUBCUTANEOUS | 1 refills | Status: DC
Start: 2021-10-03 — End: 2021-11-20

## 2021-10-03 MED ORDER — PENICILLIN V POTASSIUM 500 MG PO TABS: 500.0000 mg | ORAL_TABLET | Freq: Three times a day (TID) | ORAL | 0 refills | Status: AC

## 2021-10-03 MED ORDER — FLUCONAZOLE 150 MG PO TABS: 150.0000 mg | ORAL_TABLET | Freq: Once | ORAL | 0 refills | Status: AC

## 2021-10-03 NOTE — Assessment & Plan Note (Signed)
Controlled, no change in medication DASH diet and commitment to daily physical activity for a minimum of 30 minutes discussed and encouraged, as a part of hypertension management. The importance of attaining a healthy weight is also discussed.     10/03/2021    9:15 AM 07/25/2021    9:14 AM 05/22/2021    8:24 AM 04/10/2021    8:02 AM 04/04/2021    4:59 PM 04/04/2021    2:40 PM 03/12/2021    4:40 PM  BP/Weight  Systolic BP 161 096 045 409 811 914 782  Diastolic BP 84 86 81 96 74 99 78  Wt. (Lbs) 262.12 272 264 268.08     BMI 43.62 kg/m2 46.69 kg/m2 43.93 kg/m2 44.66 kg/m2

## 2021-10-03 NOTE — Progress Notes (Signed)
   Rose Mcguire     MRN: [id number]      DOB: 09-27-1975   HPI Rose Mcguire is here sort throat 1 week ago , followed by cough, clear , at times green, frontal pressure and clear drainage from nose x 1 day Positive strep exposure at work  ROS C/o chills.  Denies chest pains, palpitations and leg swelling Denies abdominal pain, nausea, vomiting,diarrhea or constipation.   Denies dysuria, frequency, hesitancy or incontinence. Denies joint pain, swelling and limitation in mobility. Denies headaches, seizures, numbness, or tingling. Denies depression, anxiety or insomnia. Denies skin break down or rash.   PE  BP 130/84   Pulse 98   Temp 99 F (37.2 C) (Temporal)   Resp 16   Ht '5\' 5"'$  (1.651 m)   Wt 262 lb 1.9 oz (118.9 kg)   SpO2 97%   BMI 43.62 kg/m   Patient alert and oriented and in no cardiopulmonary distress.  HEENT: No facial asymmetry, EOMI,     Neck supple .bilateral anterior cervical adenitis, oropharynx positive erythema , no exudate  Chest: Clear to auscultation bilaterally.  CVS: S1, S2 no murmurs, no S3.Regular rate.  ABD: Soft non tender.   Ext: No edema  MS: Adequate ROM spine, shoulders, hips and knees.  Skin: Intact, no ulcerations or rash noted.  Psych: Good eye contact, normal affect. Memory intact not anxious or depressed appearing.  CNS: CN 2-12 intact, power,  normal throughout.no focal deficits noted.   Assessment & Plan  Strep pharyngitis Pen V and fluconazole prescribed, work excuse x 24 hours  Essential hypertension Controlled, no change in medication DASH diet and commitment to daily physical activity for a minimum of 30 minutes discussed and encouraged, as a part of hypertension management. The importance of attaining a healthy weight is also discussed.     10/03/2021    9:15 AM 07/25/2021    9:14 AM 05/22/2021    8:24 AM 04/10/2021    8:02 AM 04/04/2021    4:59 PM 04/04/2021    2:40 PM 03/12/2021    4:40 PM  BP/Weight   Systolic BP 809 983 382 505 397 673 419  Diastolic BP 84 86 81 96 74 99 78  Wt. (Lbs) 262.12 272 264 268.08     BMI 43.62 kg/m2 46.69 kg/m2 43.93 kg/m2 44.66 kg/m2          Morbid obesity  Patient re-educated about  the importance of commitment to a  minimum of 150 minutes of exercise per week as able.  The importance of healthy food choices with portion control discussed, as well as eating regularly and within a 12 hour window most days. The need to choose "clean , green" food 50 to 75% of the time is discussed, as well as to make water the primary drink and set a goal of 64 ounces water daily.       10/03/2021    9:15 AM 07/25/2021    9:14 AM 05/22/2021    8:24 AM  Weight /BMI  Weight 262 lb 1.9 oz 272 lb 264 lb  Height '5\' 5"'$  (1.651 m) '5\' 4"'$  (1.626 m) '5\' 5"'$  (1.651 m)  BMI 43.62 kg/m2 46.69 kg/m2 43.93 kg/m2

## 2021-10-03 NOTE — Assessment & Plan Note (Signed)
  Patient re-educated about  the importance of commitment to a  minimum of 150 minutes of exercise per week as able.  The importance of healthy food choices with portion control discussed, as well as eating regularly and within a 12 hour window most days. The need to choose "clean , green" food 50 to 75% of the time is discussed, as well as to make water the primary drink and set a goal of 64 ounces water daily.       10/03/2021    9:15 AM 07/25/2021    9:14 AM 05/22/2021    8:24 AM  Weight /BMI  Weight 262 lb 1.9 oz 272 lb 264 lb  Height '5\' 5"'$  (1.651 m) '5\' 4"'$  (1.626 m) '5\' 5"'$  (1.651 m)  BMI 43.62 kg/m2 46.69 kg/m2 43.93 kg/m2

## 2021-10-03 NOTE — Patient Instructions (Addendum)
Annual with pap in 10 weeks, call if you need me sooner  Work excuse today to return 10/06/2021  Penicillin prescribed  for strep throat, fluconazole if needed , and cough syrup. Please start daily allergy tab like claritin  New for weight management is wegovy, let us knowif you are unable to get this   It is important that you exercise regularly at least 30 minutes 5 times a week. If you develop chest pain, have severe difficulty breathing, or feel very tired, stop exercising immediately and seek medical attention   Think about what you will eat, plan ahead. Choose " clean, green, fresh or frozen" over canned, processed or packaged foods which are more sugary, salty and fatty. 70 to 75% of food eaten should be vegetables and fruit. Three meals at set times with snacks allowed between meals, but they must be fruit or vegetables. Aim to eat over a 12 hour period , example 7 am to 7 pm, and STOP after  your last meal of the day. Drink water,generally about 64 ounces per day, no other drink is as healthy. Fruit juice is best enjoyed in a healthy way, by EATING the fruit.  Thanks for choosing [facility], we consider it a privelige to serve you.

## 2021-10-03 NOTE — Assessment & Plan Note (Signed)
Pen V and fluconazole prescribed, work excuse x 24 hours

## 2021-10-06 ENCOUNTER — Other Ambulatory Visit: Payer: Self-pay | Admitting: Family Medicine

## 2021-10-07 ENCOUNTER — Telehealth: Payer: Self-pay | Admitting: Family Medicine

## 2021-10-07 NOTE — Telephone Encounter (Signed)
Mancel Parsons has been approved until 05/05/2022

## 2021-10-08 NOTE — Progress Notes (Deleted)
NO SHOW

## 2021-10-09 ENCOUNTER — Ambulatory Visit (HOSPITAL_COMMUNITY): Payer: BC Managed Care – PPO | Admitting: Physician Assistant

## 2021-10-09 ENCOUNTER — Inpatient Hospital Stay (HOSPITAL_COMMUNITY): Payer: BC Managed Care – PPO | Attending: Hematology

## 2021-11-17 NOTE — Progress Notes (Unsigned)
[place], Rose Mcguire   CLINIC:  Medical Oncology/Hematology  PCP:  Fayrene Helper, MD [address], [place] [address] [phone]   REASON FOR VISIT:  Follow-up for thrombocytosis and iron deficiency anemia  PRIOR THERAPY: None  CURRENT THERAPY: Oral iron supplementation  INTERVAL HISTORY:  Rose Mcguire 46 y.o. female returns for routine follow-up of her iron deficiency anemia and thrombocytosis.  She was last seen by Tarri Abernethy PA-C on 04/10/2021.  At today's visit, she reports feeling well.  No recent hospitalizations, surgeries, or changes in baseline health status.  She is doing much better with taking her iron tablet daily most days of the week.  She denies any abnormal bleeding such as epistaxis, hematemesis, hematochezia, or melena.  She reports that her menstrual bleeding is normal and last 4 to 5 days with passing of a few clots.  She denies any fatigue, restless legs, chest pain, lightheadedness, dyspnea on exertion, or syncope.  Her previous symptoms of ice cravings have resolved.  Regarding her thrombocytosis, she has no previous history of DVT/PE, no current signs or symptoms of VTE.  She denies any aquagenic pruritus, erythromelalgia, or vasomotor symptoms.  No B symptoms such as fever, chills, night sweats, unintentional weight loss.  She has 75% energy and 100% appetite. She is intentionally losing weight with the assistance of Wegovy.   REVIEW OF SYSTEMS:    Review of Systems  Constitutional:  Negative for appetite change, chills, diaphoresis, fever and unexpected weight change.  HENT:   Negative for lump/mass and nosebleeds.   Eyes:  Negative for eye problems.  Respiratory:  Negative for cough, hemoptysis and shortness of breath.   Cardiovascular:  Negative for chest pain, leg swelling and palpitations.  Gastrointestinal:  Positive for constipation. Negative for abdominal pain, blood in  stool, diarrhea, nausea and vomiting.  Genitourinary:  Negative for hematuria.   Skin: Negative.   Neurological:  Negative for dizziness, headaches and light-headedness.  Hematological:  Does not bruise/bleed easily.      PAST MEDICAL/SURGICAL HISTORY:  Past Medical History:  Diagnosis Date   Allergy    Anemia    Hypertension    Hypertensive retinopathy    OU   Obesity    Past Surgical History:  Procedure Laterality Date   BIOPSY  06/14/2020   Procedure: BIOPSY;  Surgeon: Harvel Quale, MD;  [facility];  Service: Gastroenterology;;   COLONOSCOPY WITH PROPOFOL N/A 06/14/2020   Procedure: COLONOSCOPY WITH PROPOFOL;  Surgeon: Harvel Quale, MD;  Location: [facility];  Service: Gastroenterology;  Laterality: N/A;  8:15   ESOPHAGOGASTRODUODENOSCOPY (EGD) WITH PROPOFOL N/A 06/14/2020   Procedure: ESOPHAGOGASTRODUODENOSCOPY (EGD) WITH PROPOFOL;  Surgeon: Harvel Quale, MD;  Location: [facility];  Service: Gastroenterology;  Laterality: N/A;     SOCIAL HISTORY:  Social History   Socioeconomic History   Marital status: Soil scientist    Spouse name: Not on file   Number of children: 2   Years of education: Not on file   Highest education level: Not on file  Occupational History   Occupation: CNA     Employer: [employer]  Tobacco Use   Smoking status: Never   Smokeless tobacco: Never  Vaping Use   Vaping Use: Never used  Substance and Sexual Activity   Alcohol use: Yes    Comment: occasionally   Drug use: No   Sexual activity: Yes    Birth  control/protection: None  Other Topics Concern   Not on file  Social History Narrative   Not on file   Social Determinants of Health   Financial Resource Strain: Low Risk  (03/12/2020)   Overall Financial Resource Strain (CARDIA)    Difficulty of Paying Living Expenses: Not hard at all  Food Insecurity: No Food Insecurity (03/12/2020)   Hunger Vital Sign    Worried About Running  Out of Food in the Last Year: Never true    Ran Out of Food in the Last Year: Never true  Transportation Needs: No Transportation Needs (03/18/2020)   PRAPARE - Hydrologist (Medical): No    Lack of Transportation (Non-Medical): No  Physical Activity: Sufficiently Active (03/18/2020)   Exercise Vital Sign    Days of Exercise per Week: 5 days    Minutes of Exercise per Session: 30 min  Recent Concern: Physical Activity - Insufficiently Active (03/12/2020)   Exercise Vital Sign    Days of Exercise per Week: 4 days    Minutes of Exercise per Session: 30 min  Stress: No Stress Concern Present (03/12/2020)   [place]    Feeling of Stress : Not at all  Social Connections: Moderately Integrated (03/12/2020)   Social Connection and Isolation Panel [NHANES]    Frequency of Communication with Friends and Family: More than three times a week    Frequency of Social Gatherings with Friends and Family: More than three times a week    Attends Religious Services: More than 4 times per year    Active Member of [employer] or Organizations: No    Attends Archivist Meetings: Never    Marital Status: Living with partner  Intimate Partner Violence: Not At Risk (03/18/2020)   Humiliation, Afraid, Rape, and Kick questionnaire    Fear of Current or Ex-Partner: No    Emotionally Abused: No    Physically Abused: No    Sexually Abused: No    FAMILY HISTORY:  Family History  Problem Relation Age of Onset   Hypertension Mother    Diabetes Father    Hypertension Father    Colon cancer Maternal Aunt    Colon cancer Cousin     CURRENT MEDICATIONS:  Outpatient Encounter Medications as of 11/18/2021  Medication Sig   docusate sodium (COLACE) 100 MG capsule Take 100 mg by mouth 2 (two) times daily as needed for mild constipation.   ferrous sulfate 325 (65 FE) MG tablet Take 1 tablet (325 mg total) by mouth  daily with breakfast.   metoCLOPramide (REGLAN) 5 MG tablet Take 1 tablet (5 mg total) by mouth every 8 (eight) hours as needed for nausea.   promethazine-dextromethorphan (PROMETHAZINE-DM) 6.25-15 MG/5ML syrup Take one teaspoon at bedtime as needed, for excess cough   Semaglutide-Weight Management 1 MG/0.5ML SOAJ Inject 1 mg into the skin once a week.   triamterene-hydrochlorothiazide (MAXZIDE) 75-50 MG tablet TAKE 1 TABLET BY MOUTH EVERY DAY   Vitamin D, Ergocalciferol, (DRISDOL) 1.25 MG (50000 UNIT) CAPS capsule TAKE 1 CAPSULE BY MOUTH ONE TIME PER WEEK   No facility-administered encounter medications on file as of 11/18/2021.    ALLERGIES:  No Known Allergies   PHYSICAL EXAM:    ECOG PERFORMANCE STATUS: 0 - Asymptomatic  There were no vitals filed for this visit. There were no vitals filed for this visit. Physical Exam Constitutional:      Appearance: Normal appearance. She is morbidly obese.  HENT:     Head: Normocephalic and atraumatic.     Mouth/Throat:     Mouth: Mucous membranes are moist.  Eyes:     Extraocular Movements: Extraocular movements intact.     Pupils: Pupils are equal, round, and reactive to light.  Cardiovascular:     Rate and Rhythm: Normal rate and regular rhythm.     Pulses: Normal pulses.     Heart sounds: Normal heart sounds.  Pulmonary:     Effort: Pulmonary effort is normal.     Breath sounds: Normal breath sounds.  Abdominal:     General: Bowel sounds are normal.     Palpations: Abdomen is soft.     Tenderness: There is no abdominal tenderness.  Musculoskeletal:        General: No swelling.     Right lower leg: No edema.     Left lower leg: No edema.  Lymphadenopathy:     Cervical: No cervical adenopathy.  Skin:    General: Skin is warm and dry.  Neurological:     General: No focal deficit present.     Mental Status: She is alert and oriented to person, place, and time.  Psychiatric:        Mood and Affect: Mood normal.         Behavior: Behavior normal.      LABORATORY DATA:  I have reviewed the labs as listed.  CBC    Component Value Date/Time   WBC 9.0 05/22/2021 0857   WBC 9.3 04/02/2021 1441   RBC 4.63 05/22/2021 0857   RBC 4.65 04/02/2021 1441   HGB 12.2 05/22/2021 0857   HCT 38.8 05/22/2021 0857   PLT 562 (H) 05/22/2021 0857   MCV 84 05/22/2021 0857   MCH 26.3 (L) 05/22/2021 0857   MCH 28.0 04/02/2021 1441   MCHC 31.4 (L) 05/22/2021 0857   MCHC 31.8 04/02/2021 1441   RDW 12.7 05/22/2021 0857   LYMPHSABS 2.8 04/02/2021 1441   MONOABS 0.5 04/02/2021 1441   EOSABS 0.1 04/02/2021 1441   BASOSABS 0.1 04/02/2021 1441      Latest Ref Rng & Units 05/22/2021    8:57 AM 04/02/2021    2:41 PM 03/12/2021    4:52 PM  CMP  Glucose 70 - 99 mg/dL 89  112  120   BUN 6 - 24 mg/dL '12  11  8   ' Creatinine 0.57 - 1.00 mg/dL 1.03  0.94  1.00   Sodium 134 - 144 mmol/L 140  133  138   Potassium 3.5 - 5.2 mmol/L 4.3  3.4  4.0   Chloride 96 - 106 mmol/L 97  96  98   CO2 20 - 29 mmol/L '27  27  25   ' Calcium 8.7 - 10.2 mg/dL 10.2  9.5  9.9   Total Protein 6.0 - 8.5 g/dL 8.1  8.5    Total Bilirubin 0.0 - 1.2 mg/dL 0.3  0.4    Alkaline Phos 44 - 121 IU/L 90  84    AST 0 - 40 IU/L 9  14    ALT 0 - 32 IU/L 11  13      DIAGNOSTIC IMAGING:  I have independently reviewed the relevant imaging and discussed with the patient.  ASSESSMENT & PLAN: 1.  Thrombocytosis - Patient seen at the request of Dr. Moshe Cipro for isolated thrombocytosis since 2008. - Her ferritin on 02/21/2020 was 47.  Patient was told to start taking iron tablet since then. - She  does not have any history of VTE.  No history of connective tissue disorders or autoimmune conditions. - She is losing weight with ANVBTY - Her ANA, lupus anticoagulant and rheumatoid factor was negative but her ESR and CRP were slightly elevated. - JAK2 with reflex mutation was negative. - No vasomotor symptoms, erythromelalgia's, or aquagenic pruritus.  No B symptoms.    - Most recent labs (11/18/2021): With persistent platelet elevation 563 - Thrombocytosis suspected to be reactive secondary to her iron deficiency and obesity.   - PLAN: Iron repletion as below.  We will recheck CBC in 6 months.      2.  Iron deficiency anemia: - She was started on iron supplements since October 2021 and has been tolerating well. - EGD/colonoscopy on 06/14/2020 which showed normal esophagus and few gastric polyps.  Colonoscopy showed diverticulosis and nonbleeding internal hemorrhoids.  No specimens were collected. - She is taking ferrous sulfate daily most days of the week - Denies any active bleeding.  Denies heavy menstrual cycles (cycle last 4 to 5 days, few clots) - Most recent labs (11/18/2021): Normal Hgb 12.4.  Iron studies are still slightly low, but are improving with ferritin 51 and iron saturation 16%. - PLAN: Iron studies are improved.  Recommend continuing daily ferrous sulfate, advised to take in the morning along with a glass of orange juice. - We discussed that if her iron levels do not improve, she may require IV iron supplementation.  She would like to avoid IV iron if at all possible. - Repeat CBC and iron panel in 6 months.      3.  Constipation: - Secondary to iron tablets.   - PLAN: Recommended daily Colace with as needed MiraLAX  4.  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  She is a non-smoker. - Maternal aunt had pancreatic cancer.  Maternal uncle had throat cancer and a maternal cousin has colon cancer   PLAN SUMMARY & DISPOSITION: Labs and RTC in 6 months   All questions were answered. The patient knows to call the clinic with any problems, questions or concerns.  Medical decision making: Low   Time spent on visit: I spent 15 minutes counseling the patient face to face. The total time spent in the appointment was 22 minutes and more than 50% was on counseling.   Harriett Rush, PA-C  11/18/2021 1:59 PM

## 2021-11-18 ENCOUNTER — Inpatient Hospital Stay (HOSPITAL_COMMUNITY): Payer: BC Managed Care – PPO | Attending: Hematology

## 2021-11-18 ENCOUNTER — Encounter (HOSPITAL_COMMUNITY): Payer: Self-pay

## 2021-11-18 ENCOUNTER — Inpatient Hospital Stay (HOSPITAL_BASED_OUTPATIENT_CLINIC_OR_DEPARTMENT_OTHER): Payer: BC Managed Care – PPO | Admitting: Physician Assistant

## 2021-11-18 VITALS — BP 130/90 | HR 109 | Temp 98.4°F | Resp 18 | Ht 64.0 in | Wt 249.1 lb

## 2021-11-18 DIAGNOSIS — D509 Iron deficiency anemia, unspecified: Secondary | ICD-10-CM | POA: Diagnosis not present

## 2021-11-18 DIAGNOSIS — K5900 Constipation, unspecified: Secondary | ICD-10-CM | POA: Insufficient documentation

## 2021-11-18 DIAGNOSIS — D75839 Thrombocytosis, unspecified: Secondary | ICD-10-CM

## 2021-11-18 DIAGNOSIS — Z79899 Other long term (current) drug therapy: Secondary | ICD-10-CM | POA: Diagnosis not present

## 2021-11-18 LAB — CBC WITH DIFFERENTIAL/PLATELET
Abs Immature Granulocytes: 0.04 10*3/uL (ref 0.00–0.07)
Basophils Absolute: 0 10*3/uL (ref 0.0–0.1)
Basophils Relative: 1 %
Eosinophils Absolute: 0.1 10*3/uL (ref 0.0–0.5)
Eosinophils Relative: 1 %
HCT: 39.7 % (ref 36.0–46.0)
Hemoglobin: 12.4 g/dL (ref 12.0–15.0)
Immature Granulocytes: 1 %
Lymphocytes Relative: 30 %
Lymphs Abs: 2.4 10*3/uL (ref 0.7–4.0)
MCH: 27 pg (ref 26.0–34.0)
MCHC: 31.2 g/dL (ref 30.0–36.0)
MCV: 86.3 fL (ref 80.0–100.0)
Monocytes Absolute: 0.4 10*3/uL (ref 0.1–1.0)
Monocytes Relative: 5 %
Neutro Abs: 5.1 10*3/uL (ref 1.7–7.7)
Neutrophils Relative %: 62 %
Platelets: 563 10*3/uL — ABNORMAL HIGH (ref 150–400)
RBC: 4.6 MIL/uL (ref 3.87–5.11)
RDW: 13.7 % (ref 11.5–15.5)
WBC: 8.1 10*3/uL (ref 4.0–10.5)
nRBC: 0 % (ref 0.0–0.2)

## 2021-11-18 LAB — IRON AND TIBC
Iron: 55 ug/dL (ref 28–170)
Saturation Ratios: 16 % (ref 10.4–31.8)
TIBC: 337 ug/dL (ref 250–450)
UIBC: 282 ug/dL

## 2021-11-18 LAB — FERRITIN: Ferritin: 51 ng/mL (ref 11–307)

## 2021-11-18 NOTE — Patient Instructions (Signed)
[place] at [facility] Discharge Instructions  You were seen today by Tarri Abernethy PA-C for your elevated platelets and your iron deficiency.  IRON DEFICIENCY: Make sure that you take your iron pill once daily.  It is best absorbed when taken in the morning with a glass of orange juice.  We will check your levels again in 6 months.  ELEVATED PLATELETS: This may be related to your low iron, and also may be related to inflammation from obesity.  We will continue to monitor these at follow-up appointments.  LABS: Return in 6 months for repeat labs  OTHER TESTS: No other tests at this time  MEDICATIONS: Iron tablet once daily  FOLLOW-UP APPOINTMENT: Office visit in 6 months   Thank you for choosing [place] at [facility] to provide your oncology and hematology care.  To afford each patient quality time with our provider, please arrive at least 15 minutes before your scheduled appointment time.   If you have a lab appointment with the [facility] please come in thru the Main Entrance and check in at the main information desk.  You need to re-schedule your appointment should you arrive 10 or more minutes late.  We strive to give you quality time with our providers, and arriving late affects you and other patients whose appointments are after yours.  Also, if you no show three or more times for appointments you may be dismissed from the clinic at the providers discretion.     Again, thank you for choosing [facility].  Our hope is that these requests will decrease the amount of time that you wait before being seen by our physicians.       _____________________________________________________________  Should you have questions after your visit to [facility], please contact our office at [phone] and follow the prompts.  Our office hours are 8:00 a.m. and 4:30 p.m. Monday - Friday.  Please note that  voicemails left after 4:00 p.m. may not be returned until the following business day.  We are closed weekends and major holidays.  You do have access to a nurse 24-7, just call the main number to the clinic [phone] and do not press any options, hold on the line and a nurse will answer the phone.    For prescription refill requests, have your pharmacy contact our office and allow 72 hours.    Due to Covid, you will need to wear a mask upon entering the hospital. If you do not have a mask, a mask will be given to you at the Main Entrance upon arrival. For doctor visits, patients may have 1 support person age 44 or older with them. For treatment visits, patients can not have anyone with them due to social distancing guidelines and our immunocompromised population.

## 2021-11-19 ENCOUNTER — Telehealth: Payer: Self-pay

## 2021-11-19 NOTE — Telephone Encounter (Signed)
Patient called asking if she can get all her prescription switched to [facility].  She is having trouble with [facility] getting her Wevovy shot, only has 1 pen left and go up next week.  Please return patient call [phone].

## 2021-11-20 ENCOUNTER — Other Ambulatory Visit: Payer: Self-pay | Admitting: Family Medicine

## 2021-11-20 ENCOUNTER — Encounter: Payer: Self-pay | Admitting: Family Medicine

## 2021-11-20 ENCOUNTER — Other Ambulatory Visit: Payer: Self-pay

## 2021-11-20 MED ORDER — SEMAGLUTIDE-WEIGHT MANAGEMENT 1.7 MG/0.75ML ~~LOC~~ SOAJ: 1.7000 mg | SUBCUTANEOUS | 1 refills | Status: DC

## 2021-11-20 MED ORDER — FERROUS SULFATE 325 (65 FE) MG PO TABS: 325.0000 mg | ORAL_TABLET | Freq: Every day | ORAL | 1 refills | Status: DC

## 2021-11-20 MED ORDER — TRIAMTERENE-HCTZ 75-50 MG PO TABS: 1.0000 | ORAL_TABLET | Freq: Every day | ORAL | 1 refills | Status: DC

## 2021-11-20 MED ORDER — VITAMIN D (ERGOCALCIFEROL) 1.25 MG (50000 UNIT) PO CAPS: ORAL_CAPSULE | ORAL | 1 refills | Status: DC

## 2021-11-20 NOTE — Telephone Encounter (Signed)
Patient called office saw she miss call.  What is your current dose? 1 mg How long have you been on it?  1 month Any adverse s/e ? no What is your current weight? 259 weighed in 11/20/2021 am.

## 2021-11-20 NOTE — Telephone Encounter (Signed)
Mychart msg sent back to MD

## 2021-11-20 NOTE — Telephone Encounter (Signed)
Needs the higher dose of wegovy sent to [facility]

## 2021-12-12 ENCOUNTER — Other Ambulatory Visit (HOSPITAL_COMMUNITY)
Admission: RE | Admit: 2021-12-12 | Discharge: 2021-12-12 | Disposition: A | Discharge status: Home / Self Care | Payer: BC Managed Care – PPO | Source: Ambulatory Visit | Attending: Family Medicine | Admitting: Family Medicine

## 2021-12-12 ENCOUNTER — Other Ambulatory Visit: Payer: Self-pay

## 2021-12-12 ENCOUNTER — Encounter: Payer: Self-pay | Admitting: Family Medicine

## 2021-12-12 ENCOUNTER — Ambulatory Visit (INDEPENDENT_AMBULATORY_CARE_PROVIDER_SITE_OTHER): Payer: BC Managed Care – PPO | Admitting: Family Medicine

## 2021-12-12 ENCOUNTER — Telehealth: Payer: Self-pay | Admitting: Family Medicine

## 2021-12-12 VITALS — BP 124/88 | HR 112 | Ht 64.0 in | Wt 260.0 lb

## 2021-12-12 DIAGNOSIS — Z0000 Encounter for general adult medical examination without abnormal findings: Secondary | ICD-10-CM

## 2021-12-12 DIAGNOSIS — Z124 Encounter for screening for malignant neoplasm of cervix: Secondary | ICD-10-CM

## 2021-12-12 DIAGNOSIS — Z0001 Encounter for general adult medical examination with abnormal findings: Secondary | ICD-10-CM

## 2021-12-12 DIAGNOSIS — I10 Essential (primary) hypertension: Secondary | ICD-10-CM

## 2021-12-12 DIAGNOSIS — N760 Acute vaginitis: Secondary | ICD-10-CM

## 2021-12-12 DIAGNOSIS — Z1231 Encounter for screening mammogram for malignant neoplasm of breast: Secondary | ICD-10-CM | POA: Diagnosis not present

## 2021-12-12 NOTE — Patient Instructions (Addendum)
F/u in 10 weeks, call if you need me sooner, flu vaccine at visit  Weight loss goal of 8 to 10 pounds   Please schedule mammogram at checkout  Fasting chem 7 and EGFR and TSH 3 days before next visit  It is important that you exercise regularly at least 30 minutes 5 times a week. If you develop chest pain, have severe difficulty breathing, or feel very tired, stop exercising immediately and seek medical attention   Think about what you will eat, plan ahead. Choose " clean, green, fresh or frozen" over canned, processed or packaged foods which are more sugary, salty and fatty. 70 to 75% of food eaten should be vegetables and fruit. Three meals at set times with snacks allowed between meals, but they must be fruit or vegetables. Aim to eat over a 12 hour period , example 7 am to 7 pm, and STOP after  your last meal of the day. Drink water,generally about 64 ounces per day, no other drink is as healthy. Fruit juice is best enjoyed in a healthy way, by EATING the fruit. Thanks for choosing [facility], we consider it a privelige to serve you.

## 2021-12-12 NOTE — Progress Notes (Signed)
    Rose Mcguire     MRN: [id number]      DOB: June 11, 1975  HPI: Patient is in for annual physical exam. No other health concerns are expressed or addressed at the visit. Recent labs,  are reviewed. Immunization is reviewed , and  updated if needed.   PE: Pleasant  female, alert and oriented x 3, in no cardio-pulmonary distress. Afebrile. HEENT No facial trauma or asymetry. Sinuses non tender.  Extra occullar muscles intact.. External ears normal, . Neck: supple, no adenopathy,JVD or thyromegaly.No bruits.  Chest: Clear to ascultation bilaterally.No crackles or wheezes. Non tender to palpation  Breast: No asymetry,no masses or lumps. No tenderness. No nipple discharge or inversion. No axillary or supraclavicular adenopathy  Cardiovascular system; Heart sounds normal,  S1 and  S2 ,no S3.  No murmur, or thrill. Apical beat not displaced Peripheral pulses normal.  Abdomen: Soft, non tender, no organomegaly or masses. No bruits. Bowel sounds normal. No guarding, tenderness or rebound.   GU: External genitalia normal female genitalia , normal female distribution of hair. No lesions. Urethral meatus normal in size, no  Prolapse, no lesions visibly  Present. Bladder non tender. Vagina pink and moist , with no visible lesions , discharge present . Adequate pelvic support no  cystocele or rectocele noted Cervix pink and appears healthy, no lesions or ulcerations noted, no discharge noted from os Uterus normal size, no adnexal masses, no cervical motion or adnexal tenderness.   Musculoskeletal exam: Full ROM of spine, hips , shoulders and knees. No deformity ,swelling or crepitus noted. No muscle wasting or atrophy.   Neurologic: Cranial nerves 2 to 12 intact. Power, tone ,sensation and reflexes normal throughout. No disturbance in gait. No tremor.  Skin: Intact, no ulceration, erythema , scaling or rash noted. Pigmentation normal throughout  Psych; Normal  mood and affect. Judgement and concentration normal   Assessment & Plan:  No problem-specific Assessment & Plan notes found for this encounter.

## 2021-12-12 NOTE — Progress Notes (Unsigned)
Rose Mcguire     MRN: [id number]      DOB: 1976/04/28  HPI: Patient is in for annual physical exam. Requests dose increase in wegovy, has been unable to get medication with weight gain Recent labs,  are reviewed. Immunization is reviewed , and  updated if needed.   PE: BP 124/88 (BP Location: Right Arm, Patient Position: Sitting, Cuff Size: Large)   Pulse (!) 112   Ht '5\' 4"'$  (1.626 m)   Wt 260 lb (117.9 kg)   LMP 12/12/2021   SpO2 94%   BMI 44.63 kg/m   Pleasant  female, alert and oriented x 3, in no cardio-pulmonary distress. Afebrile. HEENT No facial trauma or asymetry. Sinuses non tender.  Extra occullar muscles intact.. External ears normal, . Neck: supple, no adenopathy,JVD or thyromegaly.No bruits.  Chest: Clear to ascultation bilaterally.No crackles or wheezes. Non tender to palpation  Breast: No asymetry,no masses or lumps. No tenderness. No nipple discharge or inversion. No axillary or supraclavicular adenopathy  Cardiovascular system; Heart sounds normal,  S1 and  S2 ,no S3.  No murmur, or thrill. Apical beat not displaced Peripheral pulses normal.  Abdomen: Soft, non tender, no organomegaly or masses. No bruits. Bowel sounds normal. No guarding, tenderness or rebound.   GU: External genitalia normal female genitalia , normal female distribution of hair. No lesions. Urethral meatus normal in size, no  Prolapse, no lesions visibly  Present. Bladder non tender. Vagina pink and moist , with no visible lesions , discharge present .bleeding from os Adequate pelvic support no  cystocele or rectocele noted Cervix pink and appears healthy, no lesions or ulcerations noted, no discharge noted from os Uterus normal size, no adnexal masses, no cervical motion or adnexal tenderness.   Musculoskeletal exam: Full ROM of spine, hips , shoulders and knees. No deformity ,swelling or crepitus noted. No muscle wasting or atrophy.   Neurologic: Cranial  nerves 2 to 12 intact. Power, tone ,sensation and reflexes normal throughout. No disturbance in gait. No tremor.  Skin: Intact, no ulceration, erythema , scaling or rash noted. Pigmentation normal throughout  Psych; Normal mood and affect. Judgement and concentration normal   Assessment & Plan:  Annual physical exam Annual exam as documented. Counseling done  re healthy lifestyle involving commitment to 150 minutes exercise per week, heart healthy diet, and attaining healthy weight.The importance of adequate sleep also discussed. Regular seat belt use and home safety, is also discussed. Changes in health habits are decided on by the patient with goals and time frames  set for achieving them. Immunization and cancer screening needs are specifically addressed at this visit.   Vulvovaginitis Specimens sent for STd testing  Morbid obesity  Patient re-educated about  the importance of commitment to a  minimum of 150 minutes of exercise per week as able.  The importance of healthy food choices with portion control discussed, as well as eating regularly and within a 12 hour window most days. The need to choose "clean , green" food 50 to 75% of the time is discussed, as well as to make water the primary drink and set a goal of 64 ounces water daily.       12/12/2021   10:46 AM 11/18/2021    1:24 PM 10/03/2021    9:15 AM  Weight /BMI  Weight 260 lb 249 lb 1.9 oz 262 lb 1.9 oz  Height '5\' 4"'$  (1.626 m) '5\' 4"'$  (1.626 m) '5\' 5"'$  (1.651 m)  BMI 44.63 kg/m2  42.76 kg/m2 43.62 kg/m2   Deteriorated  Dose increase in wegovy when next due , 10 pound weight loss goal in 10 weeks

## 2021-12-12 NOTE — Telephone Encounter (Signed)
Said she wants an increase in her wegovy sent to [facility]

## 2021-12-14 ENCOUNTER — Encounter: Payer: Self-pay | Admitting: Family Medicine

## 2021-12-14 MED ORDER — WEGOVY 2.4 MG/0.75ML ~~LOC~~ SOAJ
2.4000 mg | SUBCUTANEOUS | 2 refills | Status: DC
Start: 2021-12-14 — End: 2024-01-02

## 2021-12-14 NOTE — Assessment & Plan Note (Signed)

## 2021-12-14 NOTE — Assessment & Plan Note (Signed)
  Patient re-educated about  the importance of commitment to a  minimum of 150 minutes of exercise per week as able.  The importance of healthy food choices with portion control discussed, as well as eating regularly and within a 12 hour window most days. The need to choose "clean , green" food 50 to 75% of the time is discussed, as well as to make water the primary drink and set a goal of 64 ounces water daily.       12/12/2021   10:46 AM 11/18/2021    1:24 PM 10/03/2021    9:15 AM  Weight /BMI  Weight 260 lb 249 lb 1.9 oz 262 lb 1.9 oz  Height '5\' 4"'$  (1.626 m) '5\' 4"'$  (1.626 m) '5\' 5"'$  (1.651 m)  BMI 44.63 kg/m2 42.76 kg/m2 43.62 kg/m2   Deteriorated  Dose increase in wegovy when next due , 10 pound weight loss goal in 10 weeks

## 2021-12-14 NOTE — Assessment & Plan Note (Signed)
Specimens sent for STd testing

## 2021-12-15 LAB — CERVICOVAGINAL ANCILLARY ONLY
Bacterial Vaginitis (gardnerella): POSITIVE — AB
Candida Glabrata: NEGATIVE
Candida Vaginitis: NEGATIVE
Chlamydia: NEGATIVE
Comment: NEGATIVE
Comment: NEGATIVE
Comment: NEGATIVE
Comment: NEGATIVE
Comment: NEGATIVE
Comment: NORMAL
Neisseria Gonorrhea: NEGATIVE
Trichomonas: NEGATIVE

## 2021-12-16 ENCOUNTER — Other Ambulatory Visit: Payer: Self-pay | Admitting: Family Medicine

## 2021-12-16 MED ORDER — METRONIDAZOLE 500 MG PO TABS: 500.0000 mg | ORAL_TABLET | Freq: Two times a day (BID) | ORAL | 0 refills | Status: DC

## 2021-12-17 LAB — CYTOLOGY - PAP
Comment: NEGATIVE
Diagnosis: NEGATIVE
Diagnosis: REACTIVE
High risk HPV: NEGATIVE

## 2021-12-18 ENCOUNTER — Ambulatory Visit (HOSPITAL_COMMUNITY): Payer: BC Managed Care – PPO

## 2022-02-20 ENCOUNTER — Encounter: Payer: Self-pay | Admitting: Family Medicine

## 2022-02-20 ENCOUNTER — Ambulatory Visit: Payer: BC Managed Care – PPO | Admitting: Family Medicine

## 2022-02-20 VITALS — BP 138/74 | Ht 64.0 in | Wt 246.0 lb

## 2022-02-20 DIAGNOSIS — E559 Vitamin D deficiency, unspecified: Secondary | ICD-10-CM | POA: Diagnosis not present

## 2022-02-20 DIAGNOSIS — D509 Iron deficiency anemia, unspecified: Secondary | ICD-10-CM

## 2022-02-20 DIAGNOSIS — J302 Other seasonal allergic rhinitis: Secondary | ICD-10-CM

## 2022-02-20 DIAGNOSIS — I10 Essential (primary) hypertension: Secondary | ICD-10-CM | POA: Diagnosis not present

## 2022-02-20 MED ORDER — AZELASTINE HCL 0.1 % NA SOLN: 2.0000 | Freq: Two times a day (BID) | NASAL | 2 refills | Status: DC

## 2022-02-20 MED ORDER — SEMAGLUTIDE-WEIGHT MANAGEMENT 2.4 MG/0.75ML ~~LOC~~ SOAJ: 2.4000 mg | SUBCUTANEOUS | 3 refills | Status: DC

## 2022-02-20 NOTE — Patient Instructions (Addendum)
Follow-up in office within the second week in January call if you need me sooner.  Nurse please arrange for a visit for flu vaccine in the next 1 to 2 weeks.Please get labs already ordered on the day you come for your flu vaccine  Please schedule mammogram at checkout.  This is overdue.    Congratulations on weight loss , keep it up, no change in wegovy dosage.  Astelin is prescribed for uncontrolled allergies.  I do recommend the COVID-vaccine so please get this at your pharmacy.  Thanks for choosing [facility], we consider it a privelige to serve you.

## 2022-02-20 NOTE — Assessment & Plan Note (Signed)
Improved  Patient re-educated about  the importance of commitment to a  minimum of 150 minutes of exercise per week as able.  The importance of healthy food choices with portion control discussed, as well as eating regularly and within a 12 hour window most days. The need to choose "clean , green" food 50 to 75% of the time is discussed, as well as to make water the primary drink and set a goal of 64 ounces water daily.       02/20/2022   11:41 AM 12/12/2021   10:46 AM 11/18/2021    1:24 PM  Weight /BMI  Weight 246 lb 260 lb 249 lb 1.9 oz  Height '5\' 4"'$  (1.626 m) '5\' 4"'$  (1.626 m) '5\' 4"'$  (1.626 m)  BMI 42.23 kg/m2 44.63 kg/m2 42.76 kg/m2

## 2022-02-20 NOTE — Assessment & Plan Note (Signed)
Uncontrolled currently, commit to daily claritin and Astelin is also prescribedc

## 2022-02-20 NOTE — Assessment & Plan Note (Signed)
Continue daily iron supplement 

## 2022-02-20 NOTE — Assessment & Plan Note (Signed)
Updated lab needed at/ before next visit.   

## 2022-02-20 NOTE — Progress Notes (Signed)
\  Virtual Visit via Video Note  I connected with Rose Mcguire on 02/20/22 at 11:40 AM EDT by a video enabled telemedicine application and verified that I am speaking with the correct person using two identifiers.  Location: Patient: work Provider: office   I discussed the limitations of evaluation and management by telemedicine and the availability of in person appointments. The patient expressed understanding and agreed to proceed.  History of Present Illness: Stuffy head,  nose and sneezing, no fever or chills, no body aches, no known sick contact No adverse \ s/e from wegovy, and losing weight well Will commit to daily stool softener   Observations/Objective: BP 138/74   Ht '5\' 4"'$  (1.626 m)   Wt 246 lb (111.6 kg)   BMI 42.23 kg/m  Good communication with no confusion and intact memory. Alert and oriented x 3 No signs of respiratory distress during speech   Assessment and Plan: Essential hypertension Controlled, no change in medication DASH diet and commitment to daily physical activity for a minimum of 30 minutes discussed and encouraged, as a part of hypertension management. The importance of attaining a healthy weight is also discussed.     02/20/2022   11:41 AM 12/12/2021   10:46 AM 11/18/2021    1:24 PM 10/03/2021    9:15 AM 07/25/2021    9:14 AM 05/22/2021    8:24 AM 04/10/2021    8:02 AM  BP/Weight  Systolic BP 643 329 518 841 660 630 160  Diastolic BP 74 88 90 84 86 81 96  Wt. (Lbs) 246 260 249.12 262.12 272 264 268.08  BMI 42.23 kg/m2 44.63 kg/m2 42.76 kg/m2 43.62 kg/m2 46.69 kg/m2 43.93 kg/m2 44.66 kg/m2       Morbid obesity Improved  Patient re-educated about  the importance of commitment to a  minimum of 150 minutes of exercise per week as able.  The importance of healthy food choices with portion control discussed, as well as eating regularly and within a 12 hour window most days. The need to choose "clean , green" food 50 to 75% of the time is  discussed, as well as to make water the primary drink and set a goal of 64 ounces water daily.       02/20/2022   11:41 AM 12/12/2021   10:46 AM 11/18/2021    1:24 PM  Weight /BMI  Weight 246 lb 260 lb 249 lb 1.9 oz  Height '5\' 4"'$  (1.626 m) '5\' 4"'$  (1.626 m) '5\' 4"'$  (1.626 m)  BMI 42.23 kg/m2 44.63 kg/m2 42.76 kg/m2      Vitamin D deficiency Updated lab needed at/ before next visit.   Iron deficiency anemia Continue daily iron supplement  Allergic rhinitis Uncontrolled currently, commit to daily claritin and Astelin is also prescribedc   Follow Up Instructions:    I discussed the assessment and treatment plan with the patient. The patient was provided an opportunity to ask questions and all were answered. The patient agreed with the plan and demonstrated an understanding of the instructions.   The patient was advised to call back or seek an in-person evaluation if the symptoms worsen or if the condition fails to improve as anticipated.  I provided 13 minutes of non-face-to-face time during this encounter.   Tula Nakayama, MD

## 2022-02-20 NOTE — Assessment & Plan Note (Signed)
Controlled, no change in medication DASH diet and commitment to daily physical activity for a minimum of 30 minutes discussed and encouraged, as a part of hypertension management. The importance of attaining a healthy weight is also discussed.     02/20/2022   11:41 AM 12/12/2021   10:46 AM 11/18/2021    1:24 PM 10/03/2021    9:15 AM 07/25/2021    9:14 AM 05/22/2021    8:24 AM 04/10/2021    8:02 AM  BP/Weight  Systolic BP 295 188 416 606 301 601 093  Diastolic BP 74 88 90 84 86 81 96  Wt. (Lbs) 246 260 249.12 262.12 272 264 268.08  BMI 42.23 kg/m2 44.63 kg/m2 42.76 kg/m2 43.62 kg/m2 46.69 kg/m2 43.93 kg/m2 44.66 kg/m2

## 2022-03-06 ENCOUNTER — Ambulatory Visit (HOSPITAL_COMMUNITY): Payer: BC Managed Care – PPO

## 2022-05-21 ENCOUNTER — Encounter (HOSPITAL_COMMUNITY): Payer: Self-pay | Admitting: Emergency Medicine

## 2022-05-21 ENCOUNTER — Other Ambulatory Visit: Payer: Self-pay

## 2022-05-21 ENCOUNTER — Emergency Department (HOSPITAL_COMMUNITY)
Admission: EM | Admit: 2022-05-21 | Discharge: 2022-05-21 | Disposition: A | Discharge status: Home / Self Care | Payer: BC Managed Care – PPO | Attending: Emergency Medicine | Admitting: Emergency Medicine

## 2022-05-21 ENCOUNTER — Emergency Department (HOSPITAL_COMMUNITY): Payer: BC Managed Care – PPO

## 2022-05-21 DIAGNOSIS — M549 Dorsalgia, unspecified: Secondary | ICD-10-CM

## 2022-05-21 DIAGNOSIS — M545 Low back pain, unspecified: Secondary | ICD-10-CM | POA: Diagnosis not present

## 2022-05-21 DIAGNOSIS — Y92410 Unspecified street and highway as the place of occurrence of the external cause: Secondary | ICD-10-CM | POA: Insufficient documentation

## 2022-05-21 DIAGNOSIS — I10 Essential (primary) hypertension: Secondary | ICD-10-CM | POA: Insufficient documentation

## 2022-05-21 DIAGNOSIS — G8911 Acute pain due to trauma: Secondary | ICD-10-CM | POA: Insufficient documentation

## 2022-05-21 DIAGNOSIS — M542 Cervicalgia: Secondary | ICD-10-CM | POA: Diagnosis not present

## 2022-05-21 MED ORDER — CYCLOBENZAPRINE HCL 10 MG PO TABS: 10.0000 mg | ORAL_TABLET | Freq: Two times a day (BID) | ORAL | 0 refills | Status: DC | PRN
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
## 2022-05-22 ENCOUNTER — Inpatient Hospital Stay: Payer: BC Managed Care – PPO

## 2022-05-22 ENCOUNTER — Inpatient Hospital Stay: Payer: BC Managed Care – PPO | Attending: Physician Assistant | Admitting: Physician Assistant

## 2022-05-22 VITALS — BP 129/98 | HR 112 | Temp 98.3°F | Resp 20 | Wt 238.1 lb

## 2022-05-22 DIAGNOSIS — Z79899 Other long term (current) drug therapy: Secondary | ICD-10-CM | POA: Insufficient documentation

## 2022-05-22 DIAGNOSIS — Z833 Family history of diabetes mellitus: Secondary | ICD-10-CM | POA: Insufficient documentation

## 2022-05-22 DIAGNOSIS — D75838 Other thrombocytosis: Secondary | ICD-10-CM | POA: Diagnosis not present

## 2022-05-22 DIAGNOSIS — Z8249 Family history of ischemic heart disease and other diseases of the circulatory system: Secondary | ICD-10-CM | POA: Diagnosis not present

## 2022-05-22 DIAGNOSIS — Z800 Family history of malignant neoplasm of digestive organs: Secondary | ICD-10-CM | POA: Insufficient documentation

## 2022-05-22 DIAGNOSIS — K648 Other hemorrhoids: Secondary | ICD-10-CM | POA: Diagnosis not present

## 2022-05-22 DIAGNOSIS — D509 Iron deficiency anemia, unspecified: Secondary | ICD-10-CM | POA: Insufficient documentation

## 2022-05-22 DIAGNOSIS — E669 Obesity, unspecified: Secondary | ICD-10-CM | POA: Diagnosis not present

## 2022-05-22 DIAGNOSIS — I10 Essential (primary) hypertension: Secondary | ICD-10-CM | POA: Insufficient documentation

## 2022-05-22 DIAGNOSIS — D500 Iron deficiency anemia secondary to blood loss (chronic): Secondary | ICD-10-CM | POA: Diagnosis not present

## 2022-05-22 DIAGNOSIS — K5900 Constipation, unspecified: Secondary | ICD-10-CM | POA: Insufficient documentation

## 2022-05-22 DIAGNOSIS — D75839 Thrombocytosis, unspecified: Secondary | ICD-10-CM

## 2022-05-22 LAB — CBC WITH DIFFERENTIAL/PLATELET
Abs Immature Granulocytes: 0.03 10*3/uL (ref 0.00–0.07)
Basophils Absolute: 0 10*3/uL (ref 0.0–0.1)
Basophils Relative: 0 %
Eosinophils Absolute: 0.1 10*3/uL (ref 0.0–0.5)
Eosinophils Relative: 1 %
HCT: 39.3 % (ref 36.0–46.0)
Hemoglobin: 12.3 g/dL (ref 12.0–15.0)
Immature Granulocytes: 0 %
Lymphocytes Relative: 30 %
Lymphs Abs: 2.1 10*3/uL (ref 0.7–4.0)
MCH: 27.6 pg (ref 26.0–34.0)
MCHC: 31.3 g/dL (ref 30.0–36.0)
MCV: 88.1 fL (ref 80.0–100.0)
Monocytes Absolute: 0.4 10*3/uL (ref 0.1–1.0)
Monocytes Relative: 6 %
Neutro Abs: 4.4 10*3/uL (ref 1.7–7.7)
Neutrophils Relative %: 63 %
Platelets: 494 10*3/uL — ABNORMAL HIGH (ref 150–400)
RBC: 4.46 MIL/uL (ref 3.87–5.11)
RDW: 13 % (ref 11.5–15.5)
WBC: 7 10*3/uL (ref 4.0–10.5)
nRBC: 0 % (ref 0.0–0.2)

## 2022-05-22 LAB — IRON AND TIBC
Iron: 44 ug/dL (ref 28–170)
Saturation Ratios: 14 % (ref 10.4–31.8)
TIBC: 316 ug/dL (ref 250–450)
UIBC: 272 ug/dL

## 2022-05-22 LAB — FERRITIN: Ferritin: 54 ng/mL (ref 11–307)

## 2022-05-22 NOTE — Patient Instructions (Signed)
[place] at [facility] Discharge Instructions  You were seen today by Tarri Abernethy PA-C for your elevated platelets and your iron deficiency.  IRON DEFICIENCY: Make sure that you take your iron pill EVERY day.  It is best absorbed when taken in the morning with a glass of orange juice.  We will check your levels again in 6 months.  ELEVATED PLATELETS: This may be related to your low iron, and also may be related to inflammation from obesity.  We will continue to monitor these at follow-up appointments.  LABS: Return in 6 months for repeat labs  OTHER TESTS: No other tests at this time  MEDICATIONS: Iron tablet once daily  FOLLOW-UP APPOINTMENT: Office visit in 6 months   Thank you for choosing [place] at [facility] to provide your oncology and hematology care.  To afford each patient quality time with our provider, please arrive at least 15 minutes before your scheduled appointment time.   If you have a lab appointment with the [facility] please come in thru the Main Entrance and check in at the main information desk.  You need to re-schedule your appointment should you arrive 10 or more minutes late.  We strive to give you quality time with our providers, and arriving late affects you and other patients whose appointments are after yours.  Also, if you no show three or more times for appointments you may be dismissed from the clinic at the providers discretion.     Again, thank you for choosing [facility].  Our hope is that these requests will decrease the amount of time that you wait before being seen by our physicians.       _____________________________________________________________  Should you have questions after your visit to [facility], please contact our office at [phone] and follow the prompts.  Our office hours are 8:00 a.m. and 4:30 p.m. Monday - Friday.  Please note that  voicemails left after 4:00 p.m. may not be returned until the following business day.  We are closed weekends and major holidays.  You do have access to a nurse 24-7, just call the main number to the clinic [phone] and do not press any options, hold on the line and a nurse will answer the phone.    For prescription refill requests, have your pharmacy contact our office and allow 72 hours.    Due to Covid, you will need to wear a mask upon entering the hospital. If you do not have a mask, a mask will be given to you at the Main Entrance upon arrival. For doctor visits, patients may have 1 support person age 87 or older with them. For treatment visits, patients can not have anyone with them due to social distancing guidelines and our immunocompromised population.
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
## 2022-05-25 ENCOUNTER — Other Ambulatory Visit: Payer: Self-pay

## 2022-05-25 DIAGNOSIS — D509 Iron deficiency anemia, unspecified: Secondary | ICD-10-CM

## 2022-05-25 DIAGNOSIS — D500 Iron deficiency anemia secondary to blood loss (chronic): Secondary | ICD-10-CM

## 2022-05-25 DIAGNOSIS — D75839 Thrombocytosis, unspecified: Secondary | ICD-10-CM

## 2022-05-26 ENCOUNTER — Encounter: Payer: Self-pay | Admitting: Family Medicine

## 2022-05-26 ENCOUNTER — Telehealth: Payer: Self-pay | Admitting: Family Medicine

## 2022-05-26 ENCOUNTER — Ambulatory Visit: Payer: BC Managed Care – PPO | Admitting: Family Medicine

## 2022-05-26 VITALS — BP 122/86 | HR 100 | Ht 64.0 in | Wt 233.0 lb

## 2022-05-26 DIAGNOSIS — E559 Vitamin D deficiency, unspecified: Secondary | ICD-10-CM

## 2022-05-26 DIAGNOSIS — I10 Essential (primary) hypertension: Secondary | ICD-10-CM | POA: Diagnosis not present

## 2022-05-26 DIAGNOSIS — R7303 Prediabetes: Secondary | ICD-10-CM | POA: Diagnosis not present

## 2022-05-26 DIAGNOSIS — Z23 Encounter for immunization: Secondary | ICD-10-CM | POA: Diagnosis not present

## 2022-05-26 DIAGNOSIS — Z13220 Encounter for screening for lipoid disorders: Secondary | ICD-10-CM | POA: Diagnosis not present

## 2022-05-26 DIAGNOSIS — M549 Dorsalgia, unspecified: Secondary | ICD-10-CM | POA: Diagnosis not present

## 2022-05-26 DIAGNOSIS — Z1231 Encounter for screening mammogram for malignant neoplasm of breast: Secondary | ICD-10-CM

## 2022-05-26 MED ORDER — NAPROXEN 500 MG PO TABS: 500.0000 mg | ORAL_TABLET | Freq: Two times a day (BID) | ORAL | 0 refills | Status: DC

## 2022-05-26 MED ORDER — KETOROLAC TROMETHAMINE 60 MG/2ML IM SOLN
60.0000 mg | Freq: Once | INTRAMUSCULAR | Status: AC
  Administered 2022-05-26: 60 mg via INTRAMUSCULAR

## 2022-05-26 MED ORDER — FAMOTIDINE 40 MG PO TABS: 40.0000 mg | ORAL_TABLET | Freq: Every day | ORAL | 0 refills | Status: DC

## 2022-05-26 NOTE — Telephone Encounter (Signed)
Patient called in regard to [facility] heard anything back in regard to pre auth , wants a call back to see if med has been approved or denied. Pharm states still waiting on insurance.
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
## 2022-05-27 ENCOUNTER — Ambulatory Visit (HOSPITAL_COMMUNITY)
Admission: RE | Admit: 2022-05-27 | Discharge: 2022-05-27 | Disposition: A | Discharge status: Home / Self Care | Payer: BC Managed Care – PPO | Source: Ambulatory Visit | Attending: Family Medicine | Admitting: Family Medicine

## 2022-05-27 DIAGNOSIS — Z1231 Encounter for screening mammogram for malignant neoplasm of breast: Secondary | ICD-10-CM | POA: Insufficient documentation

## 2022-05-27 LAB — CMP14+EGFR
ALT: 14 IU/L (ref 0–32)
AST: 9 IU/L (ref 0–40)
Albumin/Globulin Ratio: 1.2 (ref 1.2–2.2)
Albumin: 4.4 g/dL (ref 3.9–4.9)
Alkaline Phosphatase: 89 IU/L (ref 44–121)
BUN/Creatinine Ratio: 9 (ref 9–23)
BUN: 9 mg/dL (ref 6–24)
Bilirubin Total: 0.3 mg/dL (ref 0.0–1.2)
CO2: 26 mmol/L (ref 20–29)
Calcium: 10.2 mg/dL (ref 8.7–10.2)
Chloride: 95 mmol/L — ABNORMAL LOW (ref 96–106)
Creatinine, Ser: 0.97 mg/dL (ref 0.57–1.00)
Globulin, Total: 3.6 g/dL (ref 1.5–4.5)
Glucose: 89 mg/dL (ref 70–99)
Potassium: 3.8 mmol/L (ref 3.5–5.2)
Sodium: 137 mmol/L (ref 134–144)
Total Protein: 8 g/dL (ref 6.0–8.5)
eGFR: 74 mL/min/{1.73_m2} (ref >59)

## 2022-05-27 LAB — VITAMIN D 25 HYDROXY (VIT D DEFICIENCY, FRACTURES): Vit D, 25-Hydroxy: 56.7 ng/mL (ref 30.0–100.0)

## 2022-05-27 LAB — TSH: TSH: 1.82 u[IU]/mL (ref 0.450–4.500)

## 2022-05-27 LAB — LIPID PANEL
Chol/HDL Ratio: 3 ratio (ref 0.0–4.4)
Cholesterol, Total: 169 mg/dL (ref 100–199)
HDL: 57 mg/dL (ref >39)
LDL Chol Calc (NIH): 99 mg/dL (ref 0–99)
Triglycerides: 66 mg/dL (ref 0–149)
VLDL Cholesterol Cal: 13 mg/dL (ref 5–40)

## 2022-05-27 LAB — HEMOGLOBIN A1C
Est. average glucose Bld gHb Est-mCnc: 117 mg/dL
Hgb A1c MFr Bld: 5.7 % — ABNORMAL HIGH (ref 4.8–5.6)

## 2022-05-27 NOTE — Telephone Encounter (Signed)
LMTRC mounjaro not on [employer] formulary and wegovy was denied

## 2022-05-28 DIAGNOSIS — M545 Low back pain, unspecified: Secondary | ICD-10-CM | POA: Diagnosis not present

## 2022-05-29 ENCOUNTER — Telehealth: Payer: Self-pay | Admitting: Family Medicine

## 2022-05-29 ENCOUNTER — Encounter: Payer: Self-pay | Admitting: Family Medicine

## 2022-05-29 NOTE — Telephone Encounter (Signed)
Pt calling in regards to letter brought about med refill?

## 2022-05-31 DIAGNOSIS — Z13220 Encounter for screening for lipoid disorders: Secondary | ICD-10-CM | POA: Insufficient documentation

## 2022-05-31 DIAGNOSIS — M545 Low back pain, unspecified: Secondary | ICD-10-CM | POA: Insufficient documentation

## 2022-05-31 DIAGNOSIS — M549 Dorsalgia, unspecified: Secondary | ICD-10-CM | POA: Insufficient documentation

## 2022-05-31 NOTE — Assessment & Plan Note (Addendum)
  Deteriorated , needs to reduce sugar and starches Patient educated about the importance of limiting  Carbohydrate intake , the need to commit to daily physical activity for a minimum of 30 minutes , and to commit weight loss. The fact that changes in all these areas will reduce or eliminate all together the development of diabetes is stressed.      Latest Ref Rng & Units 05/26/2022   10:04 AM 07/25/2021   10:13 AM 05/22/2021    8:57 AM 04/02/2021    2:41 PM 03/12/2021    4:52 PM  Diabetic Labs  HbA1c 4.8 - 5.6 % 5.7  5.9      Chol 100 - 199 mg/dL 169   158     HDL >39 mg/dL 57   61     Calc LDL 0 - 99 mg/dL 99   87     Triglycerides 0 - 149 mg/dL 66   45     Creatinine 0.57 - 1.00 mg/dL 0.97   1.03  0.94  1.00       05/26/2022    9:05 AM 05/22/2022   10:07 AM 05/21/2022    3:23 PM 05/21/2022    3:22 PM 02/20/2022   11:41 AM 12/12/2021   10:46 AM 11/18/2021    1:24 PM  BP/Weight  Systolic BP 433 295  188 416 606 301  Diastolic BP 86 98  88 74 88 90  Wt. (Lbs) 233 238.1 236  246 260 249.12  BMI 39.99 kg/m2 40.87 kg/m2 40.51 kg/m2  42.23 kg/m2 44.63 kg/m2 42.76 kg/m2       No data to display

## 2022-05-31 NOTE — Assessment & Plan Note (Signed)
Improved , continue management  Patient re-educated about  the importance of commitment to a  minimum of 150 minutes of exercise per week as able.  The importance of healthy food choices with portion control discussed, as well as eating regularly and within a 12 hour window most days. The need to choose "clean , green" food 50 to 75% of the time is discussed, as well as to make water the primary drink and set a goal of 64 ounces water daily.       05/26/2022    9:05 AM 05/22/2022   10:07 AM 05/21/2022    3:23 PM  Weight /BMI  Weight 233 lb 238 lb 1.6 oz 236 lb  Height '5\' 4"'$  (1.626 m)  '5\' 4"'$  (1.626 m)  BMI 39.99 kg/m2 40.87 kg/m2 40.51 kg/m2

## 2022-05-31 NOTE — Assessment & Plan Note (Signed)
Refer Ortho for management

## 2022-05-31 NOTE — Assessment & Plan Note (Signed)
Hyperlipidemia:Low fat diet discussed and encouraged.   Lipid Panel  Lab Results  Component Value Date   CHOL 169 05/26/2022   HDL 57 05/26/2022   LDLCALC 99 05/26/2022   TRIG 66 05/26/2022   CHOLHDL 3.0 05/26/2022     excellent

## 2022-05-31 NOTE — Assessment & Plan Note (Signed)
Updated lab needed at/ before next visit.   
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
## 2022-06-05 ENCOUNTER — Telehealth: Payer: Self-pay | Admitting: Family Medicine

## 2022-06-05 NOTE — Telephone Encounter (Signed)
FMLA [employer]   Copied Noted sleeved

## 2022-06-08 ENCOUNTER — Other Ambulatory Visit: Payer: Self-pay | Admitting: Family Medicine

## 2022-06-16 ENCOUNTER — Telehealth: Payer: Self-pay | Admitting: Family Medicine

## 2022-06-16 ENCOUNTER — Other Ambulatory Visit: Payer: Self-pay

## 2022-06-16 NOTE — Telephone Encounter (Signed)
Patient need medrefill  Semaglutide-Weight Management (WEGOVY) 2.4 MG/0.75ML SOAJ [id number]   [facility]

## 2022-06-16 NOTE — Telephone Encounter (Signed)
Patient was called she will pick up forms and forms were faxed 02.13.2024

## 2022-06-16 NOTE — Telephone Encounter (Signed)
Patient called needs to pick up this form.

## 2022-06-17 MED ORDER — WEGOVY 2.4 MG/0.75ML ~~LOC~~ SOAJ: 2.4000 mg | SUBCUTANEOUS | 2 refills | Status: DC

## 2022-06-25 ENCOUNTER — Other Ambulatory Visit: Payer: Self-pay

## 2022-06-25 MED ORDER — WEGOVY 2.4 MG/0.75ML ~~LOC~~ SOAJ: 2.4000 mg | SUBCUTANEOUS | 2 refills | Status: DC

## 2022-07-03 DIAGNOSIS — H40053 Ocular hypertension, bilateral: Secondary | ICD-10-CM | POA: Diagnosis not present

## 2022-07-03 DIAGNOSIS — H40013 Open angle with borderline findings, low risk, bilateral: Secondary | ICD-10-CM | POA: Diagnosis not present

## 2022-07-03 DIAGNOSIS — H04123 Dry eye syndrome of bilateral lacrimal glands: Secondary | ICD-10-CM | POA: Diagnosis not present

## 2022-07-03 DIAGNOSIS — H1045 Other chronic allergic conjunctivitis: Secondary | ICD-10-CM | POA: Diagnosis not present

## 2022-07-21 ENCOUNTER — Other Ambulatory Visit: Payer: Self-pay

## 2022-07-21 MED ORDER — WEGOVY 2.4 MG/0.75ML ~~LOC~~ SOAJ: 2.4000 mg | SUBCUTANEOUS | 2 refills | Status: DC

## 2022-09-14 DIAGNOSIS — H1045 Other chronic allergic conjunctivitis: Secondary | ICD-10-CM | POA: Diagnosis not present

## 2022-09-14 DIAGNOSIS — H04123 Dry eye syndrome of bilateral lacrimal glands: Secondary | ICD-10-CM | POA: Diagnosis not present

## 2022-09-14 DIAGNOSIS — H40013 Open angle with borderline findings, low risk, bilateral: Secondary | ICD-10-CM | POA: Diagnosis not present

## 2022-09-14 DIAGNOSIS — H40053 Ocular hypertension, bilateral: Secondary | ICD-10-CM | POA: Diagnosis not present

## 2022-09-24 ENCOUNTER — Ambulatory Visit: Payer: BC Managed Care – PPO | Admitting: Family Medicine

## 2022-09-24 ENCOUNTER — Encounter: Payer: Self-pay | Admitting: Family Medicine

## 2022-09-24 VITALS — BP 130/88 | HR 106 | Ht 64.0 in | Wt 231.1 lb

## 2022-09-24 DIAGNOSIS — R7303 Prediabetes: Secondary | ICD-10-CM

## 2022-09-24 DIAGNOSIS — I10 Essential (primary) hypertension: Secondary | ICD-10-CM | POA: Diagnosis not present

## 2022-09-24 DIAGNOSIS — E559 Vitamin D deficiency, unspecified: Secondary | ICD-10-CM | POA: Diagnosis not present

## 2022-09-24 MED ORDER — PHENTERMINE HCL 37.5 MG PO TABS: ORAL_TABLET | ORAL | 1 refills | Status: DC

## 2022-09-24 NOTE — Patient Instructions (Addendum)
F/U inmid September, call if you need me sooner  New additional medication to help with weight loss is phentermine half tablet daily  NEED to take BP medication EVERY day  PLEASE stop the sweets, and white  food, chips etc  HEALTHY food choice I is full of color anf fresh from the ground or frozen  It is important that you exercise regularly at least 30 minutes 76 times a week. If you develop chest pain, have severe difficulty breathing, or feel very tired, stop exercising immediately and seek medical attention    Weight loss goal of 12 to 16 pounds  Thanks for choosing [facility], we consider it a privelige to serve you.

## 2022-09-26 ENCOUNTER — Encounter: Payer: Self-pay | Admitting: Family Medicine

## 2022-09-26 NOTE — Progress Notes (Signed)
ONDA PRY     MRN: [id number]      DOB: 07-15-1975  Chief Complaint  Patient presents with   Follow-up    Follow up    HPI [name] is here for follow up and re-evaluation of chronic medical conditions, medication management and review of any available recent lab and radiology data.  Preventive health is updated, specifically  Cancer screening and Immunization.   Questions or concerns regarding consultations or procedures which the PT has had in the interim are  addressed. The PT denies any adverse reactions to current medications since the last visit.  There are no new concerns.  There are no specific complaints   ROS Denies recent fever or chills. Denies sinus pressure, nasal congestion, ear pain or sore throat. Denies chest congestion, productive cough or wheezing. Denies chest pains, palpitations and leg swelling Denies abdominal pain, nausea, vomiting,diarrhea or constipation.   Denies dysuria, frequency, hesitancy or incontinence. Denies joint pain, swelling and limitation in mobility. Denies headaches, seizures, numbness, or tingling. Denies depression, anxiety or insomnia. Denies skin break down or rash.   PE  BP 130/88   Pulse (!) 106   Ht 5\' 4"  (1.626 m)   Wt 231 lb 1.3 oz (104.8 kg)   SpO2 97%   BMI 39.66 kg/m   Patient alert and oriented and in no cardiopulmonary distress.  HEENT: No facial asymmetry, EOMI,     Neck supple .  Chest: Clear to auscultation bilaterally.  CVS: S1, S2 no murmurs, no S3.Regular rate.  ABD: Soft non tender.   Ext: No edema  MS: Adequate ROM spine, shoulders, hips and knees.  Skin: Intact, no ulcerations or rash noted.  Psych: Good eye contact, normal affect. Memory intact not anxious or depressed appearing.  CNS: CN 2-12 intact, power,  normal throughout.no focal deficits noted.   Assessment & Plan  Essential hypertension DASH diet and commitment to daily physical activity for a minimum of 30 minutes  discussed and encouraged, as a part of hypertension management. The importance of attaining a healthy weight is also discussed.     09/24/2022    8:52 AM 09/24/2022    8:26 AM 05/26/2022    9:05 AM 05/22/2022   10:07 AM 05/21/2022    3:23 PM 05/21/2022    3:22 PM 02/20/2022   11:41 AM  BP/Weight  Systolic BP 130 131 122 129  135 138  Diastolic BP 88 92 86 98  88 74  Wt. (Lbs)  231.08 233 238.1 236  246  BMI  39.66 kg/m2 39.99 kg/m2 40.87 kg/m2 40.51 kg/m2  42.23 kg/m2     Controlled, no change in medication   Morbid obesity  Patient re-educated about  the importance of commitment to a  minimum of 150 minutes of exercise per week as able.  The importance of healthy food choices with portion control discussed, as well as eating regularly and within a 12 hour window most days. The need to choose "clean , green" food 50 to 75% of the time is discussed, as well as to make water the primary drink and set a goal of 64 ounces water daily.       09/24/2022    8:26 AM 05/26/2022    9:05 AM 05/22/2022   10:07 AM  Weight /BMI  Weight 231 lb 1.3 oz 233 lb 238 lb 1.6 oz  Height 5\' 4"  (1.626 m) 5\' 4"  (1.626 m)   BMI 39.66 kg/m2 39.99 kg/m2 40.87 kg/m2  No significant improvement , add half phentermine daily  Prediabetes Patient educated about the importance of limiting  Carbohydrate intake , the need to commit to daily physical activity for a minimum of 30 minutes , and to commit weight loss. The fact that changes in all these areas will reduce or eliminate all together the development of diabetes is stressed.  Improved     Latest Ref Rng & Units 05/26/2022   10:04 AM 07/25/2021   10:13 AM 05/22/2021    8:57 AM 04/02/2021    2:41 PM 03/12/2021    4:52 PM  Diabetic Labs  HbA1c 4.8 - 5.6 % 5.7  5.9      Chol 100 - 199 mg/dL 130   865     HDL >78 mg/dL 57   61     Calc LDL 0 - 99 mg/dL 99   87     Triglycerides 0 - 149 mg/dL 66   45     Creatinine 0.57 - 1.00 mg/dL 4.69   6.29  5.28   4.13       09/24/2022    8:52 AM 09/24/2022    8:26 AM 05/26/2022    9:05 AM 05/22/2022   10:07 AM 05/21/2022    3:23 PM 05/21/2022    3:22 PM 02/20/2022   11:41 AM  BP/Weight  Systolic BP 130 131 122 129  135 138  Diastolic BP 88 92 86 98  88 74  Wt. (Lbs)  231.08 233 238.1 236  246  BMI  39.66 kg/m2 39.99 kg/m2 40.87 kg/m2 40.51 kg/m2  42.23 kg/m2       No data to display            Vitamin D deficiency corrected

## 2022-09-26 NOTE — Assessment & Plan Note (Signed)
  Patient re-educated about  the importance of commitment to a  minimum of 150 minutes of exercise per week as able.  The importance of healthy food choices with portion control discussed, as well as eating regularly and within a 12 hour window most days. The need to choose "clean , green" food 50 to 75% of the time is discussed, as well as to make water the primary drink and set a goal of 64 ounces water daily.       09/24/2022    8:26 AM 05/26/2022    9:05 AM 05/22/2022   10:07 AM  Weight /BMI  Weight 231 lb 1.3 oz 233 lb 238 lb 1.6 oz  Height 5\' 4"  (1.626 m) 5\' 4"  (1.626 m)   BMI 39.66 kg/m2 39.99 kg/m2 40.87 kg/m2    No significant improvement , add half phentermine daily

## 2022-09-26 NOTE — Assessment & Plan Note (Signed)
DASH diet and commitment to daily physical activity for a minimum of 30 minutes discussed and encouraged, as a part of hypertension management. The importance of attaining a healthy weight is also discussed.     09/24/2022    8:52 AM 09/24/2022    8:26 AM 05/26/2022    9:05 AM 05/22/2022   10:07 AM 05/21/2022    3:23 PM 05/21/2022    3:22 PM 02/20/2022   11:41 AM  BP/Weight  Systolic BP 130 131 122 129  135 138  Diastolic BP 88 92 86 98  88 74  Wt. (Lbs)  231.08 233 238.1 236  246  BMI  39.66 kg/m2 39.99 kg/m2 40.87 kg/m2 40.51 kg/m2  42.23 kg/m2     Controlled, no change in medication

## 2022-09-26 NOTE — Assessment & Plan Note (Signed)
Patient educated about the importance of limiting  Carbohydrate intake , the need to commit to daily physical activity for a minimum of 30 minutes , and to commit weight loss. The fact that changes in all these areas will reduce or eliminate all together the development of diabetes is stressed.  Improved     Latest Ref Rng & Units 05/26/2022   10:04 AM 07/25/2021   10:13 AM 05/22/2021    8:57 AM 04/02/2021    2:41 PM 03/12/2021    4:52 PM  Diabetic Labs  HbA1c 4.8 - 5.6 % 5.7  5.9      Chol 100 - 199 mg/dL 161   096     HDL >04 mg/dL 57   61     Calc LDL 0 - 99 mg/dL 99   87     Triglycerides 0 - 149 mg/dL 66   45     Creatinine 0.57 - 1.00 mg/dL 5.40   9.81  1.91  4.78       09/24/2022    8:52 AM 09/24/2022    8:26 AM 05/26/2022    9:05 AM 05/22/2022   10:07 AM 05/21/2022    3:23 PM 05/21/2022    3:22 PM 02/20/2022   11:41 AM  BP/Weight  Systolic BP 130 131 122 129  135 138  Diastolic BP 88 92 86 98  88 74  Wt. (Lbs)  231.08 233 238.1 236  246  BMI  39.66 kg/m2 39.99 kg/m2 40.87 kg/m2 40.51 kg/m2  42.23 kg/m2       No data to display

## 2022-09-26 NOTE — Assessment & Plan Note (Signed)
corrected

## 2022-09-27 ENCOUNTER — Encounter: Payer: Self-pay | Admitting: Family Medicine

## 2022-10-16 DIAGNOSIS — H10211 Acute toxic conjunctivitis, right eye: Secondary | ICD-10-CM | POA: Diagnosis not present

## 2022-10-16 DIAGNOSIS — H02845 Edema of left lower eyelid: Secondary | ICD-10-CM | POA: Diagnosis not present

## 2022-10-19 ENCOUNTER — Other Ambulatory Visit: Payer: Self-pay | Admitting: Family Medicine

## 2022-10-19 MED ORDER — PHENTERMINE HCL 37.5 MG PO TABS: ORAL_TABLET | ORAL | 2 refills | Status: DC

## 2022-11-19 NOTE — Progress Notes (Deleted)
[facility] [address], [place] [address]   CLINIC:  Medical Oncology/Hematology  PCP:  Kerri Perches, MD [address] [place] [address] [phone]   REASON FOR VISIT:  Follow-up for thrombocytosis and iron deficiency anemia   PRIOR THERAPY: None   CURRENT THERAPY: Oral iron supplementation  INTERVAL HISTORY:   Rose Mcguire 47 y.o. female returns for routine follow-up of  iron deficiency anemia and thrombocytosis.  She was last seen by Rojelio Brenner PA-C on 05/22/2022.   At today's visit, she reports feeling ***.  Otherwise, no recent hospitalizations, surgeries, or changes in baseline health status.   She is taking her iron tablet about 50***% of the time for the past 6 months, reports that she usually feels better when she takes it, and then "slacks off" on taking it once she starts to feel better.  *** *** She denies any abnormal bleeding such as epistaxis, hematemesis, hematochezia, or melena. *** She reports that her menstrual bleeding is normal and last 4 to 5 days with passing of a few clots. *** She denies any fatigue, restless legs, chest pain, lightheadedness, dyspnea on exertion, or syncope. *** Her previous symptoms of ice cravings have resolved.   ***Regarding her thrombocytosis, she has no previous history of DVT/PE, no current signs or symptoms of VTE.  She denies any aquagenic pruritus, erythromelalgia, or vasomotor symptoms.  No B symptoms such as fever, chills, night sweats, unintentional weight loss.   She has 100***% energy and 100***% appetite. She is intentionally losing weight with the assistance of Wegovy.***  ASSESSMENT & PLAN:  1.  Thrombocytosis - Patient seen at the request of Dr. 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 - Her ferritin on 02/21/2020 was 47.  Patient was told to start taking iron tablet since then.*** - She does not have any history of VTE.  No  history of connective tissue disorders or autoimmune conditions. - She is losing weight with JJOACZ - Her ANA, lupus anticoagulant and rheumatoid factor was negative but her ESR and CRP were slightly elevated. - JAK2 with reflex mutation was negative. - No vasomotor symptoms, erythromelalgia's, or aquagenic pruritus.  No B symptoms.  *** - Labs today (11/20/2022): *** - Thrombocytosis suspected to be reactive secondary to her iron deficiency and obesity.   - PLAN: Iron repletion as below.  ***We will recheck CBC in 6 months.       2.  Iron deficiency anemia: - She was started on iron supplements since October 2021 and has been tolerating well.*** - EGD/colonoscopy on 06/14/2020 which showed normal esophagus and few gastric polyps.  Colonoscopy showed diverticulosis and nonbleeding internal hemorrhoids.  No specimens were collected. - She is taking ferrous sulfate daily most days of the week*** - Denies any active bleeding.  Denies heavy menstrual cycles (cycle last 4 to 5 days, few clots)*** - Labs today (11/20/2022): *** - PLAN: ***Iron studies are stable and she is asymptomatic.  Patient encouraged to take her iron tablet DAILY for maximum benefit. - We discussed that if her iron levels do not improve, she may require IV iron supplementation.  She would like to avoid IV iron if at all possible.*** - Repeat CBC and iron panel in 6 months.    ***   3.  Constipation: - Secondary to iron tablets.   - PLAN: Recommended daily Colace with as needed MiraLAX   4.  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  She is a non-smoker. - Maternal aunt had pancreatic cancer.  Maternal uncle had throat cancer and a maternal cousin has colon cancer  PLAN SUMMARY: >> ***    REVIEW OF SYSTEMS: ***  Review of Systems  Constitutional:  Negative for appetite change, chills, diaphoresis, fatigue, fever and unexpected weight change.  HENT:   Negative for  lump/mass and nosebleeds.   Eyes:  Negative for eye problems.  Respiratory:  Negative for cough, hemoptysis and shortness of breath.   Cardiovascular:  Negative for chest pain, leg swelling and palpitations.  Gastrointestinal:  Negative for abdominal pain, blood in stool, constipation, diarrhea, nausea and vomiting.  Genitourinary:  Negative for hematuria.   Skin: Negative.   Neurological:  Negative for dizziness, headaches and light-headedness.  Hematological:  Does not bruise/bleed easily.     PHYSICAL EXAM:***  ECOG PERFORMANCE STATUS: 0 - Asymptomatic  There were no vitals filed for this visit.  There were no vitals filed for this visit.  Physical Exam Constitutional:      Appearance: Normal appearance. She is morbidly obese.  HENT:     Head: Normocephalic and atraumatic.     Mouth/Throat:     Mouth: Mucous membranes are moist.  Eyes:     Extraocular Movements: Extraocular movements intact.     Pupils: Pupils are equal, round, and reactive to light.  Cardiovascular:     Rate and Rhythm: Normal rate and regular rhythm.     Pulses: Normal pulses.     Heart sounds: Normal heart sounds.  Pulmonary:     Effort: Pulmonary effort is normal.     Breath sounds: Normal breath sounds.  Abdominal:     General: Bowel sounds are normal.     Palpations: Abdomen is soft.     Tenderness: There is no abdominal tenderness.  Musculoskeletal:        General: No swelling.     Right lower leg: No edema.     Left lower leg: No edema.  Lymphadenopathy:     Cervical: No cervical adenopathy.  Skin:    General: Skin is warm and dry.  Neurological:     General: No focal deficit present.     Mental Status: She is alert and oriented to person, place, and time.  Psychiatric:        Mood and Affect: Mood normal.        Behavior: Behavior normal.     PAST MEDICAL/SURGICAL HISTORY:  Past Medical History:  Diagnosis Date   Allergy    Anemia    Hypertension    Hypertensive retinopathy     OU   Obesity    Past Surgical History:  Procedure Laterality Date   BIOPSY  06/14/2020   Procedure: BIOPSY;  Surgeon: Dolores Frame, MD;  Location: [facility];  Service: Gastroenterology;;   COLONOSCOPY WITH PROPOFOL N/A 06/14/2020   Procedure: COLONOSCOPY WITH PROPOFOL;  Surgeon: Dolores Frame, MD;  Location: [facility];  Service: Gastroenterology;  Laterality: N/A;  8:15   ESOPHAGOGASTRODUODENOSCOPY (EGD) WITH PROPOFOL N/A 06/14/2020   Procedure: ESOPHAGOGASTRODUODENOSCOPY (EGD) WITH PROPOFOL;  Surgeon: Dolores Frame, MD;  Location: [facility];  Service: Gastroenterology;  Laterality: N/A;    SOCIAL HISTORY:  Social History   Socioeconomic History   Marital status: Media planner    Spouse name: Not on file   Number of children: 2   Years of education: Not on file  Highest education level: Not on file  Occupational History   Occupation: CNA     Employer: [employer]  Tobacco Use   Smoking status: Never   Smokeless tobacco: Never  Vaping Use   Vaping status: Never Used  Substance and Sexual Activity   Alcohol use: Yes    Comment: occasionally   Drug use: No   Sexual activity: Yes    Birth control/protection: None  Other Topics Concern   Not on file  Social History Narrative   Not on file   Social Determinants of Health   Financial Resource Strain: Low Risk  (03/12/2020)   Overall Financial Resource Strain (CARDIA)    Difficulty of Paying Living Expenses: Not hard at all  Food Insecurity: No Food Insecurity (03/12/2020)   Hunger Vital Sign    Worried About Running Out of Food in the Last Year: Never true    Ran Out of Food in the Last Year: Never true  Transportation Needs: No Transportation Needs (03/18/2020)   PRAPARE - Administrator, Civil Service (Medical): No    Lack of Transportation (Non-Medical): No  Physical Activity: Sufficiently Active (03/18/2020)   Exercise Vital Sign    Days of Exercise per  Week: 5 days    Minutes of Exercise per Session: 30 min  Recent Concern: Physical Activity - Insufficiently Active (03/12/2020)   Exercise Vital Sign    Days of Exercise per Week: 4 days    Minutes of Exercise per Session: 30 min  Stress: No Stress Concern Present (03/12/2020)   [employer] of Occupational Health - Occupational Stress Questionnaire    Feeling of Stress : Not at all  Social Connections: Moderately Integrated (03/12/2020)   Social Connection and Isolation Panel [NHANES]    Frequency of Communication with Friends and Family: More than three times a week    Frequency of Social Gatherings with Friends and Family: More than three times a week    Attends Religious Services: More than 4 times per year    Active Member of [employer] or Organizations: No    Attends Banker Meetings: Never    Marital Status: Living with partner  Intimate Partner Violence: Not At Risk (03/18/2020)   Humiliation, Afraid, Rape, and Kick questionnaire    Fear of Current or Ex-Partner: No    Emotionally Abused: No    Physically Abused: No    Sexually Abused: No    FAMILY HISTORY:  Family History  Problem Relation Age of Onset   Hypertension Mother    Diabetes Father    Hypertension Father    Colon cancer Maternal Aunt    Colon cancer Cousin     CURRENT MEDICATIONS:  Outpatient Encounter Medications as of 11/20/2022  Medication Sig   azelastine (ASTELIN) 0.1 % nasal spray Place 2 sprays into both nostrils 2 (two) times daily. Use in each nostril as directed   cyclobenzaprine (FLEXERIL) 10 MG tablet Take 1 tablet (10 mg total) by mouth 2 (two) times daily as needed for muscle spasms.   docusate sodium (COLACE) 100 MG capsule Take 100 mg by mouth 2 (two) times daily as needed for mild constipation.   famotidine (PEPCID) 40 MG tablet Take 1 tablet (40 mg total) by mouth daily.   ferrous sulfate 325 (65 FE) MG tablet Take 1 tablet (325 mg total) by mouth daily with breakfast.    naproxen (NAPROSYN) 500 MG tablet Take 1 tablet (500 mg total) by mouth 2 (two) times daily with a  meal.   phentermine (ADIPEX-P) 37.5 MG tablet Take ONE  tablet by mouth once daily with breakfast   Semaglutide-Weight Management (WEGOVY) 2.4 MG/0.75ML SOAJ Inject 2.4 mg into the skin once a week.   triamterene-hydrochlorothiazide (MAXZIDE) 75-50 MG tablet Take 1 tablet by mouth daily.   Vitamin D, Ergocalciferol, (DRISDOL) 1.25 MG (50000 UNIT) CAPS capsule TAKE 1 CAPSULE BY MOUTH ONE TIME PER WEEK   [DISCONTINUED] Semaglutide-Weight Management (WEGOVY) 2.4 MG/0.75ML SOAJ Inject 2.4 mg into the skin once a week.   No facility-administered encounter medications on file as of 11/20/2022.    ALLERGIES:  No Known Allergies  LABORATORY DATA:  I have reviewed the labs as listed.  CBC    Component Value Date/Time   WBC 7.0 05/22/2022 0951   RBC 4.46 05/22/2022 0951   HGB 12.3 05/22/2022 0951   HGB 12.2 05/22/2021 0857   HCT 39.3 05/22/2022 0951   HCT 38.8 05/22/2021 0857   PLT 494 (H) 05/22/2022 0951   PLT 562 (H) 05/22/2021 0857   MCV 88.1 05/22/2022 0951   MCV 84 05/22/2021 0857   MCH 27.6 05/22/2022 0951   MCHC 31.3 05/22/2022 0951   RDW 13.0 05/22/2022 0951   RDW 12.7 05/22/2021 0857   LYMPHSABS 2.1 05/22/2022 0951   MONOABS 0.4 05/22/2022 0951   EOSABS 0.1 05/22/2022 0951   BASOSABS 0.0 05/22/2022 0951      Latest Ref Rng & Units 05/26/2022   10:04 AM 05/22/2021    8:57 AM 04/02/2021    2:41 PM  CMP  Glucose 70 - 99 mg/dL 89  89  147   BUN 6 - 24 mg/dL 9  12  11    Creatinine 0.57 - 1.00 mg/dL 8.29  5.62  1.30   Sodium 134 - 144 mmol/L 137  140  133   Potassium 3.5 - 5.2 mmol/L 3.8  4.3  3.4   Chloride 96 - 106 mmol/L 95  97  96   CO2 20 - 29 mmol/L 26  27  27    Calcium 8.7 - 10.2 mg/dL 86.5  78.4  9.5   Total Protein 6.0 - 8.5 g/dL 8.0  8.1  8.5   Total Bilirubin 0.0 - 1.2 mg/dL 0.3  0.3  0.4   Alkaline Phos 44 - 121 IU/L 89  90  84   AST 0 - 40 IU/L 9  9  14    ALT 0  - 32 IU/L 14  11  13      DIAGNOSTIC IMAGING:  I have independently reviewed the relevant imaging and discussed with the patient.   WRAP UP:  All questions were answered. The patient knows to call the clinic with any problems, questions or concerns.  Medical decision making: Low  Time spent on visit: I spent 15 minutes counseling the patient face to face. The total time spent in the appointment was 22 minutes and more than 50% was on counseling.  Carnella Guadalajara, PA-C  ***

## 2022-11-20 ENCOUNTER — Inpatient Hospital Stay: Payer: BC Managed Care – PPO

## 2022-11-20 ENCOUNTER — Inpatient Hospital Stay: Payer: BC Managed Care – PPO | Attending: Physician Assistant | Admitting: Physician Assistant

## 2022-11-20 DIAGNOSIS — K317 Polyp of stomach and duodenum: Secondary | ICD-10-CM | POA: Insufficient documentation

## 2022-11-20 DIAGNOSIS — Z833 Family history of diabetes mellitus: Secondary | ICD-10-CM | POA: Insufficient documentation

## 2022-11-20 DIAGNOSIS — D75838 Other thrombocytosis: Secondary | ICD-10-CM | POA: Insufficient documentation

## 2022-11-20 DIAGNOSIS — Z8249 Family history of ischemic heart disease and other diseases of the circulatory system: Secondary | ICD-10-CM | POA: Insufficient documentation

## 2022-11-20 DIAGNOSIS — E669 Obesity, unspecified: Secondary | ICD-10-CM | POA: Insufficient documentation

## 2022-11-20 DIAGNOSIS — I10 Essential (primary) hypertension: Secondary | ICD-10-CM | POA: Insufficient documentation

## 2022-11-20 DIAGNOSIS — K5730 Diverticulosis of large intestine without perforation or abscess without bleeding: Secondary | ICD-10-CM | POA: Insufficient documentation

## 2022-11-20 DIAGNOSIS — Z800 Family history of malignant neoplasm of digestive organs: Secondary | ICD-10-CM | POA: Insufficient documentation

## 2022-11-20 DIAGNOSIS — K648 Other hemorrhoids: Secondary | ICD-10-CM | POA: Insufficient documentation

## 2022-11-20 DIAGNOSIS — K5900 Constipation, unspecified: Secondary | ICD-10-CM | POA: Insufficient documentation

## 2022-11-20 DIAGNOSIS — R634 Abnormal weight loss: Secondary | ICD-10-CM | POA: Insufficient documentation

## 2022-11-20 DIAGNOSIS — Z79899 Other long term (current) drug therapy: Secondary | ICD-10-CM | POA: Insufficient documentation

## 2022-11-20 DIAGNOSIS — D509 Iron deficiency anemia, unspecified: Secondary | ICD-10-CM | POA: Insufficient documentation

## 2022-11-24 NOTE — Progress Notes (Unsigned)
[facility] [address], [place] [address]   CLINIC:  Medical Oncology/Hematology  PCP:  Kerri Perches, MD [address] [place] [address] [phone]   REASON FOR VISIT:  Follow-up for thrombocytosis and iron deficiency anemia   PRIOR THERAPY: None   CURRENT THERAPY: Oral iron supplementation   INTERVAL HISTORY:   Rose Mcguire 47 y.o. female returns for routine follow-up of  iron deficiency anemia and thrombocytosis.  She was last seen by Rojelio Brenner PA-C on 05/22/2022.   At today's visit, she reports feeling fair, but is grieving the passing of her father in June 2024. Otherwise, no recent hospitalizations, surgeries, or changes in baseline health status.   She is taking her iron tablet most days.  She denies any abnormal bleeding such as epistaxis, hematemesis, hematochezia, or melena.  She reports that her menstrual bleeding is normal and last 4 to 5 days with passing of a few clots.  She denies any fatigue, restless legs, chest pain, lightheadedness, dyspnea on exertion, or syncope. Her previous symptoms of ice cravings have resolved.   Regarding her thrombocytosis, she has no previous history of DVT/PE, no current signs or symptoms of VTE.  She denies any aquagenic pruritus, erythromelalgia, or vasomotor symptoms.  No B symptoms such as fever, chills, night sweats, unintentional weight loss.   She has 90% energy and 100% appetite. She is intentionally losing weight with the assistance of Wegovy (previously) and now on phentermine.   ASSESSMENT & PLAN:  1.  Thrombocytosis - Patient seen at the request of Dr. 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 - Her ferritin on 02/21/2020 was 47.  Patient was told to start taking iron tablet since then. - She does not have any history of VTE.  No history of connective tissue disorders or autoimmune conditions. - She is losing weight with NGEXBM - Her  ANA, lupus anticoagulant and rheumatoid factor was negative but her ESR and CRP were slightly elevated. - JAK2 with reflex mutation was negative. - No vasomotor symptoms, erythromelalgia's, or aquagenic pruritus.  No B symptoms.   - Labs today (11/20/2022): Platelets 536, otherwise normal CBC - Thrombocytosis suspected to be reactive secondary to her iron deficiency and obesity.   - PLAN: Iron repletion as below.  We will recheck CBC in 6 months.       2.  Iron deficiency, without anemia: - She was started on iron supplements since October 2021 and has been tolerating well. - EGD/colonoscopy on 06/14/2020 which showed normal esophagus and few gastric polyps.  Colonoscopy showed diverticulosis and nonbleeding internal hemorrhoids.  No specimens were collected. - She is taking ferrous sulfate daily most days of the week - Denies any active bleeding.  Denies heavy menstrual cycles (cycle last 4 to 5 days, few clots) - Labs today (11/20/2022): Ferritin 45, iron saturation 12%, Hgb 13.3 - PLAN: Iron studies are stable and she is asymptomatic.  Patient encouraged to take her iron tablet DAILY for maximum benefit. - We discussed that if her iron levels do not improve (ferritin < 30) or if she becomes symptomatic or anemic, she may require IV iron supplementation.  She would like to avoid IV iron if at all possible. - Repeat CBC and iron panel in 6 months.       3.  Constipation: - Secondary to iron tablets.   - PLAN: Recommended daily Colace with as needed MiraLAX   4.  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  She is a non-smoker. - Maternal aunt had pancreatic cancer.  Maternal uncle had throat cancer and a maternal cousin has colon cancer   PLAN SUMMARY: >> Labs in 6 months = CBC/D, ferritin, iron/TIBC >> OFFICE visit in 6 months      REVIEW OF SYSTEMS:   Review of Systems  Constitutional:  Negative for appetite change, chills,  diaphoresis, fatigue, fever and unexpected weight change.  HENT:   Negative for lump/mass and nosebleeds.   Eyes:  Negative for eye problems.  Respiratory:  Negative for cough, hemoptysis and shortness of breath.   Cardiovascular:  Negative for chest pain, leg swelling and palpitations.  Gastrointestinal:  Positive for constipation. Negative for abdominal pain, blood in stool, diarrhea, nausea and vomiting.  Genitourinary:  Negative for hematuria.   Skin: Negative.   Neurological:  Negative for dizziness, headaches and light-headedness.  Hematological:  Does not bruise/bleed easily.     PHYSICAL EXAM:  ECOG PERFORMANCE STATUS: 0 - Asymptomatic  Vitals:   11/25/22 1303  BP: 137/83  Pulse: 66  Resp: 17  Temp: 99.3 F (37.4 C)  SpO2: 100%   Filed Weights   11/25/22 1303  Weight: 226 lb 9.6 oz (102.8 kg)   Physical Exam Constitutional:      Appearance: Normal appearance. She is obese.  Cardiovascular:     Heart sounds: Normal heart sounds.  Pulmonary:     Breath sounds: Normal breath sounds.  Neurological:     General: No focal deficit present.     Mental Status: Mental status is at baseline.  Psychiatric:        Behavior: Behavior normal. Behavior is cooperative.     PAST MEDICAL/SURGICAL HISTORY:  Past Medical History:  Diagnosis Date   Allergy    Anemia    Hypertension    Hypertensive retinopathy    OU   Obesity    Past Surgical History:  Procedure Laterality Date   BIOPSY  06/14/2020   Procedure: BIOPSY;  Surgeon: Dolores Frame, MD;  Location: [facility];  Service: Gastroenterology;;   COLONOSCOPY WITH PROPOFOL N/A 06/14/2020   Procedure: COLONOSCOPY WITH PROPOFOL;  Surgeon: Dolores Frame, MD;  Location: [facility];  Service: Gastroenterology;  Laterality: N/A;  8:15   ESOPHAGOGASTRODUODENOSCOPY (EGD) WITH PROPOFOL N/A 06/14/2020   Procedure: ESOPHAGOGASTRODUODENOSCOPY (EGD) WITH PROPOFOL;  Surgeon: Dolores Frame, MD;   Location: [facility];  Service: Gastroenterology;  Laterality: N/A;    SOCIAL HISTORY:  Social History   Socioeconomic History   Marital status: Media planner    Spouse name: Not on file   Number of children: 2   Years of education: Not on file   Highest education level: Not on file  Occupational History   Occupation: CNA     Employer: [employer]  Tobacco Use   Smoking status: Never   Smokeless tobacco: Never  Vaping Use   Vaping status: Never Used  Substance and Sexual Activity   Alcohol use: Yes    Comment: occasionally   Drug use: No   Sexual activity: Yes    Birth control/protection: None  Other Topics Concern   Not on file  Social History Narrative   Not on file   Social Determinants of Health   Financial Resource Strain: Low Risk  (03/12/2020)   Overall Financial Resource Strain (CARDIA)    Difficulty of Paying Living Expenses: Not hard at all  Food Insecurity: No Food Insecurity (03/12/2020)   Hunger Vital Sign    Worried About Running Out of Food in the Last Year: Never true    Ran Out of Food in the Last Year: Never true  Transportation Needs: No Transportation Needs (03/18/2020)   PRAPARE - Administrator, Civil Service (Medical): No    Lack of Transportation (Non-Medical): No  Physical Activity: Sufficiently Active (03/18/2020)   Exercise Vital Sign    Days of Exercise per Week: 5 days    Minutes of Exercise per Session: 30 min  Recent Concern: Physical Activity - Insufficiently Active (03/12/2020)   Exercise Vital Sign    Days of Exercise per Week: 4 days    Minutes of Exercise per Session: 30 min  Stress: No Stress Concern Present (03/12/2020)   [employer] of Occupational Health - Occupational Stress Questionnaire    Feeling of Stress : Not at all  Social Connections: Moderately Integrated (03/12/2020)   Social Connection and Isolation Panel [NHANES]    Frequency of Communication with Friends and Family: More than three times a  week    Frequency of Social Gatherings with Friends and Family: More than three times a week    Attends Religious Services: More than 4 times per year    Active Member of [employer] or Organizations: No    Attends Banker Meetings: Never    Marital Status: Living with partner  Intimate Partner Violence: Not At Risk (03/18/2020)   Humiliation, Afraid, Rape, and Kick questionnaire    Fear of Current or Ex-Partner: No    Emotionally Abused: No    Physically Abused: No    Sexually Abused: No    FAMILY HISTORY:  Family History  Problem Relation Age of Onset   Hypertension Mother    Diabetes Father    Hypertension Father    Colon cancer Maternal Aunt    Colon cancer Cousin     CURRENT MEDICATIONS:  Outpatient Encounter Medications as of 11/25/2022  Medication Sig   docusate sodium (COLACE) 100 MG capsule Take 100 mg by mouth 2 (two) times daily as needed for mild constipation.   ferrous sulfate 325 (65 FE) MG tablet Take 1 tablet (325 mg total) by mouth daily with breakfast.   phentermine (ADIPEX-P) 37.5 MG tablet Take ONE  tablet by mouth once daily with breakfast   triamterene-hydrochlorothiazide (MAXZIDE) 75-50 MG tablet Take 1 tablet by mouth daily.   Vitamin D, Ergocalciferol, (DRISDOL) 1.25 MG (50000 UNIT) CAPS capsule TAKE 1 CAPSULE BY MOUTH ONE TIME PER WEEK   [DISCONTINUED] azelastine (ASTELIN) 0.1 % nasal spray Place 2 sprays into both nostrils 2 (two) times daily. Use in each nostril as directed   [DISCONTINUED] cyclobenzaprine (FLEXERIL) 10 MG tablet Take 1 tablet (10 mg total) by mouth 2 (two) times daily as needed for muscle spasms.   [DISCONTINUED] famotidine (PEPCID) 40 MG tablet Take 1 tablet (40 mg total) by mouth daily.   [DISCONTINUED] naproxen (NAPROSYN) 500 MG tablet Take 1 tablet (500 mg total) by mouth 2 (two) times daily with a meal.   [DISCONTINUED] Semaglutide-Weight Management (WEGOVY) 2.4 MG/0.75ML SOAJ Inject 2.4 mg into the skin once a week.    [DISCONTINUED] Semaglutide-Weight Management (WEGOVY) 2.4 MG/0.75ML SOAJ Inject 2.4 mg into the skin once a week.   No facility-administered encounter medications on file as of 11/25/2022.    ALLERGIES:  No Known Allergies  LABORATORY DATA:  I have reviewed the labs as listed.  CBC  Component Value Date/Time   WBC 7.9 11/25/2022 1201   RBC 4.77 11/25/2022 1201   HGB 13.3 11/25/2022 1201   HGB 12.2 05/22/2021 0857   HCT 42.1 11/25/2022 1201   HCT 38.8 05/22/2021 0857   PLT 536 (H) 11/25/2022 1201   PLT 562 (H) 05/22/2021 0857   MCV 88.3 11/25/2022 1201   MCV 84 05/22/2021 0857   MCH 27.9 11/25/2022 1201   MCHC 31.6 11/25/2022 1201   RDW 13.6 11/25/2022 1201   RDW 12.7 05/22/2021 0857   LYMPHSABS 2.1 05/22/2022 0951   MONOABS 0.4 05/22/2022 0951   EOSABS 0.1 05/22/2022 0951   BASOSABS 0.0 05/22/2022 0951      Latest Ref Rng & Units 05/26/2022   10:04 AM 05/22/2021    8:57 AM 04/02/2021    2:41 PM  CMP  Glucose 70 - 99 mg/dL 89  89  161   BUN 6 - 24 mg/dL 9  12  11    Creatinine 0.57 - 1.00 mg/dL 0.96  0.45  4.09   Sodium 134 - 144 mmol/L 137  140  133   Potassium 3.5 - 5.2 mmol/L 3.8  4.3  3.4   Chloride 96 - 106 mmol/L 95  97  96   CO2 20 - 29 mmol/L 26  27  27    Calcium 8.7 - 10.2 mg/dL 81.1  91.4  9.5   Total Protein 6.0 - 8.5 g/dL 8.0  8.1  8.5   Total Bilirubin 0.0 - 1.2 mg/dL 0.3  0.3  0.4   Alkaline Phos 44 - 121 IU/L 89  90  84   AST 0 - 40 IU/L 9  9  14    ALT 0 - 32 IU/L 14  11  13      DIAGNOSTIC IMAGING:  I have independently reviewed the relevant imaging and discussed with the patient.   WRAP UP:  All questions were answered. The patient knows to call the clinic with any problems, questions or concerns.  Medical decision making: Low  Time spent on visit: I spent 15 minutes counseling the patient face to face. The total time spent in the appointment was 22 minutes and more than 50% was on counseling.  Carnella Guadalajara, PA-C  11/25/22 1:41 PM

## 2022-11-25 ENCOUNTER — Inpatient Hospital Stay: Payer: BC Managed Care – PPO

## 2022-11-25 ENCOUNTER — Inpatient Hospital Stay: Payer: BC Managed Care – PPO | Admitting: Physician Assistant

## 2022-11-25 VITALS — BP 137/83 | HR 66 | Temp 99.3°F | Resp 17 | Ht 64.0 in | Wt 226.6 lb

## 2022-11-25 DIAGNOSIS — E611 Iron deficiency: Secondary | ICD-10-CM

## 2022-11-25 DIAGNOSIS — D75839 Thrombocytosis, unspecified: Secondary | ICD-10-CM

## 2022-11-25 DIAGNOSIS — Z833 Family history of diabetes mellitus: Secondary | ICD-10-CM | POA: Diagnosis not present

## 2022-11-25 DIAGNOSIS — E669 Obesity, unspecified: Secondary | ICD-10-CM | POA: Diagnosis not present

## 2022-11-25 DIAGNOSIS — Z8249 Family history of ischemic heart disease and other diseases of the circulatory system: Secondary | ICD-10-CM | POA: Diagnosis not present

## 2022-11-25 DIAGNOSIS — D75838 Other thrombocytosis: Secondary | ICD-10-CM | POA: Diagnosis not present

## 2022-11-25 DIAGNOSIS — K5730 Diverticulosis of large intestine without perforation or abscess without bleeding: Secondary | ICD-10-CM | POA: Diagnosis not present

## 2022-11-25 DIAGNOSIS — D509 Iron deficiency anemia, unspecified: Secondary | ICD-10-CM

## 2022-11-25 DIAGNOSIS — D500 Iron deficiency anemia secondary to blood loss (chronic): Secondary | ICD-10-CM

## 2022-11-25 DIAGNOSIS — I10 Essential (primary) hypertension: Secondary | ICD-10-CM | POA: Diagnosis not present

## 2022-11-25 DIAGNOSIS — K317 Polyp of stomach and duodenum: Secondary | ICD-10-CM | POA: Diagnosis not present

## 2022-11-25 DIAGNOSIS — R634 Abnormal weight loss: Secondary | ICD-10-CM | POA: Diagnosis not present

## 2022-11-25 DIAGNOSIS — K648 Other hemorrhoids: Secondary | ICD-10-CM | POA: Diagnosis not present

## 2022-11-25 DIAGNOSIS — K5900 Constipation, unspecified: Secondary | ICD-10-CM | POA: Diagnosis not present

## 2022-11-25 DIAGNOSIS — Z800 Family history of malignant neoplasm of digestive organs: Secondary | ICD-10-CM | POA: Diagnosis not present

## 2022-11-25 DIAGNOSIS — Z79899 Other long term (current) drug therapy: Secondary | ICD-10-CM | POA: Diagnosis not present

## 2022-11-25 LAB — CBC
HCT: 42.1 % (ref 36.0–46.0)
Hemoglobin: 13.3 g/dL (ref 12.0–15.0)
MCH: 27.9 pg (ref 26.0–34.0)
MCHC: 31.6 g/dL (ref 30.0–36.0)
MCV: 88.3 fL (ref 80.0–100.0)
Platelets: 536 10*3/uL — ABNORMAL HIGH (ref 150–400)
RBC: 4.77 MIL/uL (ref 3.87–5.11)
RDW: 13.6 % (ref 11.5–15.5)
WBC: 7.9 10*3/uL (ref 4.0–10.5)
nRBC: 0 % (ref 0.0–0.2)

## 2022-11-25 LAB — FERRITIN: Ferritin: 45 ng/mL (ref 11–307)

## 2022-11-25 LAB — IRON AND TIBC
Iron: 44 ug/dL (ref 28–170)
Saturation Ratios: 12 % (ref 10.4–31.8)
TIBC: 365 ug/dL (ref 250–450)
UIBC: 321 ug/dL

## 2022-11-25 NOTE — Patient Instructions (Signed)
[place] at [facility] Discharge Instructions  You were seen today by Tarri Abernethy PA-C for your elevated platelets and your iron deficiency.  IRON DEFICIENCY: Make sure that you take your iron pill EVERY day.  It is best absorbed when taken in the morning with a glass of orange juice.  We will check your levels again in 6 months.  ELEVATED PLATELETS: This may be related to your low iron, and also may be related to inflammation from obesity.  We will continue to monitor these at follow-up appointments.  LABS: Return in 6 months for repeat labs  OTHER TESTS: No other tests at this time  MEDICATIONS: Iron tablet once daily  FOLLOW-UP APPOINTMENT: Office visit in 6 months   Thank you for choosing [place] at [facility] to provide your oncology and hematology care.  To afford each patient quality time with our provider, please arrive at least 15 minutes before your scheduled appointment time.   If you have a lab appointment with the [facility] please come in thru the Main Entrance and check in at the main information desk.  You need to re-schedule your appointment should you arrive 10 or more minutes late.  We strive to give you quality time with our providers, and arriving late affects you and other patients whose appointments are after yours.  Also, if you no show three or more times for appointments you may be dismissed from the clinic at the providers discretion.     Again, thank you for choosing [facility].  Our hope is that these requests will decrease the amount of time that you wait before being seen by our physicians.       _____________________________________________________________  Should you have questions after your visit to [facility], please contact our office at [phone] and follow the prompts.  Our office hours are 8:00 a.m. and 4:30 p.m. Monday - Friday.  Please note that  voicemails left after 4:00 p.m. may not be returned until the following business day.  We are closed weekends and major holidays.  You do have access to a nurse 24-7, just call the main number to the clinic [phone] and do not press any options, hold on the line and a nurse will answer the phone.    For prescription refill requests, have your pharmacy contact our office and allow 72 hours.    Due to Covid, you will need to wear a mask upon entering the hospital. If you do not have a mask, a mask will be given to you at the Main Entrance upon arrival. For doctor visits, patients may have 1 support person age 87 or older with them. For treatment visits, patients can not have anyone with them due to social distancing guidelines and our immunocompromised population.

## 2022-12-07 ENCOUNTER — Other Ambulatory Visit: Payer: Self-pay | Admitting: Family Medicine

## 2022-12-07 ENCOUNTER — Encounter: Payer: Self-pay | Admitting: Family Medicine

## 2023-01-03 ENCOUNTER — Other Ambulatory Visit: Payer: Self-pay | Admitting: Family Medicine

## 2023-01-16 ENCOUNTER — Other Ambulatory Visit: Payer: Self-pay | Admitting: Family Medicine

## 2023-01-21 ENCOUNTER — Encounter: Payer: Self-pay | Admitting: Family Medicine

## 2023-01-21 ENCOUNTER — Ambulatory Visit: Payer: BC Managed Care – PPO | Admitting: Family Medicine

## 2023-01-21 VITALS — BP 118/83 | HR 110 | Ht 64.0 in | Wt 231.1 lb

## 2023-01-21 DIAGNOSIS — Z1231 Encounter for screening mammogram for malignant neoplasm of breast: Secondary | ICD-10-CM | POA: Diagnosis not present

## 2023-01-21 DIAGNOSIS — D509 Iron deficiency anemia, unspecified: Secondary | ICD-10-CM

## 2023-01-21 DIAGNOSIS — I10 Essential (primary) hypertension: Secondary | ICD-10-CM

## 2023-01-21 DIAGNOSIS — Z23 Encounter for immunization: Secondary | ICD-10-CM

## 2023-01-21 DIAGNOSIS — R7303 Prediabetes: Secondary | ICD-10-CM

## 2023-01-21 MED ORDER — FERROUS SULFATE 325 (65 FE) MG PO TABS: 325.0000 mg | ORAL_TABLET | Freq: Every day | ORAL | 5 refills | Status: AC

## 2023-01-21 MED ORDER — PHENTERMINE HCL 37.5 MG PO TABS: ORAL_TABLET | ORAL | 3 refills | Status: DC

## 2023-01-21 NOTE — Assessment & Plan Note (Signed)
Updated lab needed at/ before next visit.   

## 2023-01-21 NOTE — Assessment & Plan Note (Signed)
  Patient re-educated about  the importance of commitment to a  minimum of 150 minutes of exercise per week as able.  The importance of healthy food choices with portion control discussed, as well as eating regularly and within a 12 hour window most days. The need to choose "clean , green" food 50 to 75% of the time is discussed, as well as to make water the primary drink and set a goal of 64 ounces water daily.       01/21/2023    8:52 AM 11/25/2022    1:03 PM 09/24/2022    8:26 AM  Weight /BMI  Weight 231 lb 1.3 oz 226 lb 9.6 oz 231 lb 1.3 oz  Height 5\' 4"  (1.626 m) 5\' 4"  (1.626 m) 5\' 4"  (1.626 m)  BMI 39.66 kg/m2 38.9 kg/m2 39.66 kg/m2    No med change, work on lifestyle

## 2023-01-21 NOTE — Assessment & Plan Note (Signed)
Iron refilled , needs to take consistently

## 2023-01-21 NOTE — Patient Instructions (Signed)
Annual exam in mid January, call if you ned me sooner  Weight loss goal of 10 pounds  Flu vaccine today  HBA1C, chem 7 and eGFr today  Please schedule January  mammogram at [facility]  It is important that you exercise regularly at least 30 minutes 5 times a week. If you develop chest pain, have severe difficulty breathing, or feel very tired, stop exercising immediately and seek medical attention

## 2023-01-21 NOTE — Progress Notes (Signed)
Rose Mcguire     MRN: [id number]      DOB: 07-Apr-1976  Chief Complaint  Patient presents with   Follow-up    Follow up discuss weight loss medication    HPI [name] is here for follow up and re-evaluation of chronic medical conditions, medication management and review of any available recent lab and radiology data.  Preventive health is updated, specifically  Cancer screening and Immunization.   Questions or concerns regarding consultations or procedures which the PT has had in the interim are  addressed. The PT denies any adverse reactions to current medications since the last visit.  Lost father 2 months ago, now responsible for caring for her Mom, grieving and has been less diligent with food choice as  she would want to be Plans t increase exercise , now 2 to 3 days per week  ROS Denies recent fever or chills. Denies sinus pressure, nasal congestion, ear pain or sore throat. Denies chest congestion, productive cough or wheezing. Denies chest pains, palpitations and leg swelling Denies abdominal pain, nausea, vomiting,diarrhea or constipation.   Denies dysuria, frequency, hesitancy or incontinence. Denies joint pain, swelling and limitation in mobility. Denies headaches, seizures, numbness, or tingling.  Denies skin break down or rash.   PE  BP 118/83 (BP Location: Right Arm, Patient Position: Sitting, Cuff Size: Large)   Pulse (!) 110   Ht 5\' 4"  (1.626 m)   Wt 231 lb 1.3 oz (104.8 kg)   SpO2 97%   BMI 39.66 kg/m   Patient alert and oriented and in no cardiopulmonary distress.  HEENT: No facial asymmetry, EOMI,     Neck supple .  Chest: Clear to auscultation bilaterally.  CVS: S1, S2 no murmurs, no S3.Regular rate.  ABD: Soft non tender.   Ext: No edema  MS: Adequate ROM spine, Mcguire, hips and knees.  Skin: Intact, no ulcerations or rash noted.  Psych: Good eye contact, normal affect. Memory intact not anxious or depressed appearing.  CNS: CN  2-12 intact, power,  normal throughout.no focal deficits noted.   Assessment & Plan  Essential hypertension Controlled, no change in medication DASH diet and commitment to daily physical activity for a minimum of 30 minutes discussed and encouraged, as a part of hypertension management. The importance of attaining a healthy weight is also discussed.     01/21/2023    8:52 AM 11/25/2022    1:03 PM 09/24/2022    8:52 AM 09/24/2022    8:26 AM 05/26/2022    9:05 AM 05/22/2022   10:07 AM 05/21/2022    3:23 PM  BP/Weight  Systolic BP 118 137 130 131 122 129   Diastolic BP 83 83 88 92 86 98   Wt. (Lbs) 231.08 226.6  231.08 233 238.1 236  BMI 39.66 kg/m2 38.9 kg/m2  39.66 kg/m2 39.99 kg/m2 40.87 kg/m2 40.51 kg/m2       Morbid obesity  Patient re-educated about  the importance of commitment to a  minimum of 150 minutes of exercise per week as able.  The importance of healthy food choices with portion control discussed, as well as eating regularly and within a 12 hour window most days. The need to choose "clean , green" food 50 to 75% of the time is discussed, as well as to make water the primary drink and set a goal of 64 ounces water daily.       01/21/2023    8:52 AM 11/25/2022    1:03 PM  09/24/2022    8:26 AM  Weight /BMI  Weight 231 lb 1.3 oz 226 lb 9.6 oz 231 lb 1.3 oz  Height 5\' 4"  (1.626 m) 5\' 4"  (1.626 m) 5\' 4"  (1.626 m)  BMI 39.66 kg/m2 38.9 kg/m2 39.66 kg/m2    No med change, work on lifestyle  Iron deficiency anemia Iron refilled , needs to take consistently  Prediabetes Patient educated about the importance of limiting  Carbohydrate intake , the need to commit to daily physical activity for a minimum of 30 minutes , and to commit weight loss. The fact that changes in all these areas will reduce or eliminate all together the development of diabetes is stressed.      Latest Ref Rng & Units 05/26/2022   10:04 AM 07/25/2021   10:13 AM 05/22/2021    8:57 AM 04/02/2021     2:41 PM 03/12/2021    4:52 PM  Diabetic Labs  HbA1c 4.8 - 5.6 % 5.7  5.9      Chol 100 - 199 mg/dL 161   096     HDL >04 mg/dL 57   61     Calc LDL 0 - 99 mg/dL 99   87     Triglycerides 0 - 149 mg/dL 66   45     Creatinine 0.57 - 1.00 mg/dL 5.40   9.81  1.91  4.78       01/21/2023    8:52 AM 11/25/2022    1:03 PM 09/24/2022    8:52 AM 09/24/2022    8:26 AM 05/26/2022    9:05 AM 05/22/2022   10:07 AM 05/21/2022    3:23 PM  BP/Weight  Systolic BP 118 137 130 131 122 129   Diastolic BP 83 83 88 92 86 98   Wt. (Lbs) 231.08 226.6  231.08 233 238.1 236  BMI 39.66 kg/m2 38.9 kg/m2  39.66 kg/m2 39.99 kg/m2 40.87 kg/m2 40.51 kg/m2       No data to display          Updated lab needed at/ before next visit.   Lipid screening Updated lab needed at/ before next visit.

## 2023-01-21 NOTE — Assessment & Plan Note (Signed)
Controlled, no change in medication DASH diet and commitment to daily physical activity for a minimum of 30 minutes discussed and encouraged, as a part of hypertension management. The importance of attaining a healthy weight is also discussed.     01/21/2023    8:52 AM 11/25/2022    1:03 PM 09/24/2022    8:52 AM 09/24/2022    8:26 AM 05/26/2022    9:05 AM 05/22/2022   10:07 AM 05/21/2022    3:23 PM  BP/Weight  Systolic BP 118 137 130 131 122 129   Diastolic BP 83 83 88 92 86 98   Wt. (Lbs) 231.08 226.6  231.08 233 238.1 236  BMI 39.66 kg/m2 38.9 kg/m2  39.66 kg/m2 39.99 kg/m2 40.87 kg/m2 40.51 kg/m2

## 2023-01-21 NOTE — Assessment & Plan Note (Signed)
Patient educated about the importance of limiting  Carbohydrate intake , the need to commit to daily physical activity for a minimum of 30 minutes , and to commit weight loss. The fact that changes in all these areas will reduce or eliminate all together the development of diabetes is stressed.      Latest Ref Rng & Units 05/26/2022   10:04 AM 07/25/2021   10:13 AM 05/22/2021    8:57 AM 04/02/2021    2:41 PM 03/12/2021    4:52 PM  Diabetic Labs  HbA1c 4.8 - 5.6 % 5.7  5.9      Chol 100 - 199 mg/dL 161   096     HDL >04 mg/dL 57   61     Calc LDL 0 - 99 mg/dL 99   87     Triglycerides 0 - 149 mg/dL 66   45     Creatinine 0.57 - 1.00 mg/dL 5.40   9.81  1.91  4.78       01/21/2023    8:52 AM 11/25/2022    1:03 PM 09/24/2022    8:52 AM 09/24/2022    8:26 AM 05/26/2022    9:05 AM 05/22/2022   10:07 AM 05/21/2022    3:23 PM  BP/Weight  Systolic BP 118 137 130 131 122 129   Diastolic BP 83 83 88 92 86 98   Wt. (Lbs) 231.08 226.6  231.08 233 238.1 236  BMI 39.66 kg/m2 38.9 kg/m2  39.66 kg/m2 39.99 kg/m2 40.87 kg/m2 40.51 kg/m2       No data to display          Updated lab needed at/ before next visit.

## 2023-01-22 LAB — BMP8+EGFR
BUN/Creatinine Ratio: 13 (ref 9–23)
BUN: 12 mg/dL (ref 6–24)
CO2: 27 mmol/L (ref 20–29)
Calcium: 9.8 mg/dL (ref 8.7–10.2)
Chloride: 95 mmol/L — ABNORMAL LOW (ref 96–106)
Creatinine, Ser: 0.93 mg/dL (ref 0.57–1.00)
Glucose: 98 mg/dL (ref 70–99)
Potassium: 3.8 mmol/L (ref 3.5–5.2)
Sodium: 139 mmol/L (ref 134–144)
eGFR: 78 mL/min/{1.73_m2} (ref >59)

## 2023-01-22 LAB — HEMOGLOBIN A1C
Est. average glucose Bld gHb Est-mCnc: 126 mg/dL
Hgb A1c MFr Bld: 6 % — ABNORMAL HIGH (ref 4.8–5.6)

## 2023-02-10 ENCOUNTER — Other Ambulatory Visit: Payer: Self-pay | Admitting: Family Medicine

## 2023-03-19 ENCOUNTER — Other Ambulatory Visit: Payer: Self-pay | Admitting: Family Medicine

## 2023-04-25 ENCOUNTER — Other Ambulatory Visit: Payer: Self-pay | Admitting: Family Medicine

## 2023-05-26 ENCOUNTER — Inpatient Hospital Stay: Payer: BC Managed Care – PPO | Attending: Physician Assistant

## 2023-05-26 ENCOUNTER — Encounter: Payer: BC Managed Care – PPO | Admitting: Family Medicine

## 2023-05-26 DIAGNOSIS — Z8249 Family history of ischemic heart disease and other diseases of the circulatory system: Secondary | ICD-10-CM | POA: Insufficient documentation

## 2023-05-26 DIAGNOSIS — R5383 Other fatigue: Secondary | ICD-10-CM | POA: Insufficient documentation

## 2023-05-26 DIAGNOSIS — Z800 Family history of malignant neoplasm of digestive organs: Secondary | ICD-10-CM | POA: Insufficient documentation

## 2023-05-26 DIAGNOSIS — Z833 Family history of diabetes mellitus: Secondary | ICD-10-CM | POA: Insufficient documentation

## 2023-05-26 DIAGNOSIS — E669 Obesity, unspecified: Secondary | ICD-10-CM | POA: Insufficient documentation

## 2023-05-26 DIAGNOSIS — K648 Other hemorrhoids: Secondary | ICD-10-CM | POA: Insufficient documentation

## 2023-05-26 DIAGNOSIS — K5730 Diverticulosis of large intestine without perforation or abscess without bleeding: Secondary | ICD-10-CM | POA: Insufficient documentation

## 2023-05-26 DIAGNOSIS — K5900 Constipation, unspecified: Secondary | ICD-10-CM | POA: Insufficient documentation

## 2023-05-26 DIAGNOSIS — K317 Polyp of stomach and duodenum: Secondary | ICD-10-CM | POA: Insufficient documentation

## 2023-05-26 DIAGNOSIS — D75838 Other thrombocytosis: Secondary | ICD-10-CM | POA: Insufficient documentation

## 2023-05-26 DIAGNOSIS — I10 Essential (primary) hypertension: Secondary | ICD-10-CM | POA: Insufficient documentation

## 2023-05-26 DIAGNOSIS — D509 Iron deficiency anemia, unspecified: Secondary | ICD-10-CM | POA: Insufficient documentation

## 2023-05-26 DIAGNOSIS — Z79899 Other long term (current) drug therapy: Secondary | ICD-10-CM | POA: Insufficient documentation

## 2023-05-26 DIAGNOSIS — K921 Melena: Secondary | ICD-10-CM | POA: Insufficient documentation

## 2023-05-31 ENCOUNTER — Inpatient Hospital Stay: Payer: BC Managed Care – PPO

## 2023-05-31 ENCOUNTER — Ambulatory Visit (HOSPITAL_COMMUNITY)
Admission: RE | Admit: 2023-05-31 | Discharge: 2023-05-31 | Disposition: A | Discharge status: Home / Self Care | Payer: BC Managed Care – PPO | Source: Ambulatory Visit | Attending: Family Medicine | Admitting: Family Medicine

## 2023-05-31 DIAGNOSIS — K5730 Diverticulosis of large intestine without perforation or abscess without bleeding: Secondary | ICD-10-CM | POA: Diagnosis not present

## 2023-05-31 DIAGNOSIS — K317 Polyp of stomach and duodenum: Secondary | ICD-10-CM | POA: Diagnosis not present

## 2023-05-31 DIAGNOSIS — Z79899 Other long term (current) drug therapy: Secondary | ICD-10-CM | POA: Diagnosis not present

## 2023-05-31 DIAGNOSIS — R5383 Other fatigue: Secondary | ICD-10-CM | POA: Diagnosis not present

## 2023-05-31 DIAGNOSIS — K921 Melena: Secondary | ICD-10-CM | POA: Diagnosis not present

## 2023-05-31 DIAGNOSIS — Z8249 Family history of ischemic heart disease and other diseases of the circulatory system: Secondary | ICD-10-CM | POA: Diagnosis not present

## 2023-05-31 DIAGNOSIS — E611 Iron deficiency: Secondary | ICD-10-CM

## 2023-05-31 DIAGNOSIS — Z800 Family history of malignant neoplasm of digestive organs: Secondary | ICD-10-CM | POA: Diagnosis not present

## 2023-05-31 DIAGNOSIS — Z1231 Encounter for screening mammogram for malignant neoplasm of breast: Secondary | ICD-10-CM | POA: Insufficient documentation

## 2023-05-31 DIAGNOSIS — K5900 Constipation, unspecified: Secondary | ICD-10-CM | POA: Diagnosis not present

## 2023-05-31 DIAGNOSIS — E669 Obesity, unspecified: Secondary | ICD-10-CM | POA: Diagnosis not present

## 2023-05-31 DIAGNOSIS — D75838 Other thrombocytosis: Secondary | ICD-10-CM | POA: Diagnosis not present

## 2023-05-31 DIAGNOSIS — D509 Iron deficiency anemia, unspecified: Secondary | ICD-10-CM | POA: Diagnosis not present

## 2023-05-31 DIAGNOSIS — K648 Other hemorrhoids: Secondary | ICD-10-CM | POA: Diagnosis not present

## 2023-05-31 DIAGNOSIS — I10 Essential (primary) hypertension: Secondary | ICD-10-CM | POA: Diagnosis not present

## 2023-05-31 DIAGNOSIS — Z833 Family history of diabetes mellitus: Secondary | ICD-10-CM | POA: Diagnosis not present

## 2023-05-31 DIAGNOSIS — D75839 Thrombocytosis, unspecified: Secondary | ICD-10-CM

## 2023-05-31 LAB — CBC WITH DIFFERENTIAL/PLATELET
Abs Immature Granulocytes: 0.05 10*3/uL (ref 0.00–0.07)
Basophils Absolute: 0 10*3/uL (ref 0.0–0.1)
Basophils Relative: 0 %
Eosinophils Absolute: 0 10*3/uL (ref 0.0–0.5)
Eosinophils Relative: 0 %
HCT: 42.9 % (ref 36.0–46.0)
Hemoglobin: 13.7 g/dL (ref 12.0–15.0)
Immature Granulocytes: 1 %
Lymphocytes Relative: 34 %
Lymphs Abs: 2.1 10*3/uL (ref 0.7–4.0)
MCH: 27.8 pg (ref 26.0–34.0)
MCHC: 31.9 g/dL (ref 30.0–36.0)
MCV: 87 fL (ref 80.0–100.0)
Monocytes Absolute: 0.6 10*3/uL (ref 0.1–1.0)
Monocytes Relative: 10 %
Neutro Abs: 3.4 10*3/uL (ref 1.7–7.7)
Neutrophils Relative %: 55 %
Platelets: 484 10*3/uL — ABNORMAL HIGH (ref 150–400)
RBC: 4.93 MIL/uL (ref 3.87–5.11)
RDW: 12.5 % (ref 11.5–15.5)
WBC: 6.2 10*3/uL (ref 4.0–10.5)
nRBC: 0 % (ref 0.0–0.2)

## 2023-05-31 LAB — IRON AND TIBC
Iron: 49 ug/dL (ref 28–170)
Saturation Ratios: 16 % (ref 10.4–31.8)
TIBC: 313 ug/dL (ref 250–450)
UIBC: 264 ug/dL

## 2023-05-31 LAB — FERRITIN: Ferritin: 120 ng/mL (ref 11–307)

## 2023-06-01 ENCOUNTER — Other Ambulatory Visit (HOSPITAL_COMMUNITY): Payer: Self-pay | Admitting: Family Medicine

## 2023-06-01 DIAGNOSIS — R928 Other abnormal and inconclusive findings on diagnostic imaging of breast: Secondary | ICD-10-CM

## 2023-06-01 NOTE — Progress Notes (Unsigned)
Rose [address]guire [address] [place] [address], [place] [address]   CLINIC:  Medical Oncology/Hematology  PCP:  Rose Perches, MD [address] [place] [address] [phone]   REASON FOR VISIT:  Follow-up for thrombocytosis and iron deficiency anemia   PRIOR THERAPY: None   CURRENT THERAPY: Oral iron supplementation   INTERVAL HISTORY:   Rose Mcguire 48 y.o. female returns for routine follow-up of  iron deficiency anemia and thrombocytosis.  She was last seen by Rose Brenner PA-C on 11/25/2022.   At today's visit, she reports feeling fair***, but is grieving the passing of her father in June 2024. ***Otherwise, no recent hospitalizations, surgeries, or changes in baseline health status.***   She is taking her iron tablet most days *** DAILY??  *** ***  She denies any abnormal bleeding such as epistaxis, hematemesis, hematochezia, or melena.   ***She reports that her menstrual bleeding is normal and last 4 to 5 days with passing of a few clots. ***  She denies any fatigue, restless legs, chest pain, lightheadedness, dyspnea on exertion, or syncope.  ***Her previous symptoms of ice cravings have resolved.   Regarding her thrombocytosis, she has no previous history of DVT/PE, no current signs or symptoms of VTE. *** *** She denies any aquagenic pruritus, erythromelalgia, or vasomotor symptoms.   ***No B symptoms such as fever, chills, night sweats, unintentional weight loss.   She has 90***% energy and 100***% appetite. She is intentionally losing weight with the assistance of Wegovy (previously) and now on phentermine.***   ASSESSMENT & PLAN:  1.  Thrombocytosis - Patient seen at the request of Dr. 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 - Her ferritin on 02/21/2020 was 47.  Patient was told to start taking iron tablet since then. - She does not have any history of VTE.  No history of connective tissue disorders or  autoimmune conditions. - She is losing weight with ONGEXB - Her ANA, lupus anticoagulant and rheumatoid factor was negative but her ESR and CRP were slightly elevated. - JAK2 with reflex mutation was negative. - No vasomotor symptoms, erythromelalgia's, or aquagenic pruritus.  No B symptoms.  *** - Most recent labs (05/31/2023): Platelets 484, otherwise normal CBC. - Thrombocytosis suspected to be reactive secondary to her iron deficiency and obesity.   - PLAN: Iron repletion as below.  We will recheck CBC in 1 year.       2.  Iron deficiency, without anemia: - She was started on iron supplements since October 2021 and has been tolerating well. - EGD/colonoscopy on 06/14/2020 which showed normal esophagus and few gastric polyps.  Colonoscopy showed diverticulosis and nonbleeding internal hemorrhoids.  No specimens were collected. - She is taking ferrous sulfate daily most days of the week *** DAILY *** - Denies any active bleeding.  ***Denies heavy menstrual cycles (cycle last 4 to 5 days, few clots)*** - Most recent labs (05/31/2023): Hgb 13.7, ferritin 120, iron saturation 16%. - PLAN: No indication for IV iron at this time. - Continue daily iron supplement. - Repeat CBC and iron panel in 1 year***   3.  Constipation: - Secondary to iron tablets.   - PLAN: Recommended daily Colace with as needed MiraLAX***   4.  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  She is a non-smoker. - Maternal aunt had pancreatic cancer.  Maternal uncle had throat cancer and a maternal cousin  has colon cancer   PLAN SUMMARY: >> Labs in 1 year = CBC/D, ferritin, iron/TIBC, ESR, CRP >> OFFICE visit in 1 year      REVIEW OF SYSTEMS: ***  Review of Systems  Constitutional:  Negative for appetite change, chills, diaphoresis, fatigue, fever and unexpected weight change.  HENT:   Negative for lump/mass and nosebleeds.   Eyes:  Negative for eye problems.   Respiratory:  Negative for cough, hemoptysis and shortness of breath.   Cardiovascular:  Negative for chest pain, leg swelling and palpitations.  Gastrointestinal:  Positive for constipation. Negative for abdominal pain, blood in stool, diarrhea, nausea and vomiting.  Genitourinary:  Negative for hematuria.   Skin: Negative.   Neurological:  Negative for dizziness, headaches and light-headedness.  Hematological:  Does not bruise/bleed easily.     PHYSICAL EXAM:***  ECOG PERFORMANCE STATUS: 0 - Asymptomatic  There were no vitals filed for this visit.  There were no vitals filed for this visit.  Physical Exam Constitutional:      Appearance: Normal appearance. She is obese.  Cardiovascular:     Heart sounds: Normal heart sounds.  Pulmonary:     Breath sounds: Normal breath sounds.  Neurological:     General: No focal deficit present.     Mental Status: Mental status is at baseline.  Psychiatric:        Behavior: Behavior normal. Behavior is cooperative.     PAST MEDICAL/SURGICAL HISTORY:  Past Medical History:  Diagnosis Date   Allergy    Anemia    Hypertension    Hypertensive retinopathy    OU   Obesity    Past Surgical History:  Procedure Laterality Date   BIOPSY  06/14/2020   Procedure: BIOPSY;  Surgeon: Dolores Frame, MD;  [facility];  Service: Gastroenterology;;   COLONOSCOPY WITH PROPOFOL N/A 06/14/2020   Procedure: COLONOSCOPY WITH PROPOFOL;  Surgeon: Dolores Frame, MD;  Location: [facility];  Service: Gastroenterology;  Laterality: N/A;  8:15   ESOPHAGOGASTRODUODENOSCOPY (EGD) WITH PROPOFOL N/A 06/14/2020   Procedure: ESOPHAGOGASTRODUODENOSCOPY (EGD) WITH PROPOFOL;  Surgeon: Dolores Frame, MD;  Location: [facility];  Service: Gastroenterology;  Laterality: N/A;    SOCIAL HISTORY:  Social History   Socioeconomic History   Marital status: Media planner    Spouse name: Not on file   Number of  children: 2   Years of education: Not on file   Highest education level: Not on file  Occupational History   Occupation: CNA     Employer: [employer]  Tobacco Use   Smoking status: Never   Smokeless tobacco: Never  Vaping Use   Vaping status: Never Used  Substance and Sexual Activity   Alcohol use: Yes    Comment: occasionally   Drug use: No   Sexual activity: Yes    Birth control/protection: None  Other Topics Concern   Not on file  Social History Narrative   Not on file   Social Drivers of Health   Financial Resource Strain: Low Risk  (03/12/2020)   Overall Financial Resource Strain (CARDIA)    Difficulty of Paying Living Expenses: Not hard at all  Food Insecurity: No Food Insecurity (03/12/2020)   Hunger Vital Sign    Worried About Running Out of Food in the Last Year: Never true    Ran Out of Food in the Last Year: Never true  Transportation Needs: No Transportation Needs (03/18/2020)   PRAPARE - Transportation    Lack of  Transportation (Medical): No    Lack of Transportation (Non-Medical): No  Physical Activity: Sufficiently Active (03/18/2020)   Exercise Vital Sign    Days of Exercise per Week: 5 days    Minutes of Exercise per Session: 30 min  Recent Concern: Physical Activity - Insufficiently Active (03/12/2020)   Exercise Vital Sign    Days of Exercise per Week: 4 days    Minutes of Exercise per Session: 30 min  Stress: No Stress Concern Present (03/12/2020)   [employer] of Occupational Health - Occupational Stress Questionnaire    Feeling of Stress : Not at all  Social Connections: Moderately Integrated (03/12/2020)   Social Connection and Isolation Panel [NHANES]    Frequency of Communication with Friends and Family: More than three times a week    Frequency of Social Gatherings with Friends and Family: More than three times a week    Attends Religious Services: More than 4 times per year    Active Member of [employer] or Organizations: No    Attends Tax inspector Meetings: Never    Marital Status: Living with partner  Intimate Partner Violence: Not At Risk (03/18/2020)   Humiliation, Afraid, Rape, and Kick questionnaire    Fear of Current or Ex-Partner: No    Emotionally Abused: No    Physically Abused: No    Sexually Abused: No    FAMILY HISTORY:  Family History  Problem Relation Age of Onset   Hypertension Mother    Diabetes Father    Hypertension Father    Colon cancer Maternal Aunt    Colon cancer Cousin     CURRENT MEDICATIONS:  Outpatient Encounter Medications as of 06/02/2023  Medication Sig   docusate sodium (COLACE) 100 MG capsule Take 100 mg by mouth 2 (two) times daily as needed for mild constipation.   ferrous sulfate (FEROSUL) 325 (65 FE) MG tablet Take 1 tablet (325 mg total) by mouth daily with breakfast.   phentermine (ADIPEX-P) 37.5 MG tablet Take ONE  tablet by mouth once daily with breakfast   triamterene-hydrochlorothiazide (MAXZIDE) 75-50 MG tablet Take 1 tablet by mouth once daily   [DISCONTINUED] Semaglutide-Weight Management (WEGOVY) 2.4 MG/0.75ML SOAJ Inject 2.4 mg into the skin once a week.   No facility-administered encounter medications on file as of 06/02/2023.    ALLERGIES:  No Known Allergies  LABORATORY DATA:  I have reviewed the labs as listed.  CBC    Component Value Date/Time   WBC 6.2 05/31/2023 0940   RBC 4.93 05/31/2023 0940   HGB 13.7 05/31/2023 0940   HGB 12.2 05/22/2021 0857   HCT 42.9 05/31/2023 0940   HCT 38.8 05/22/2021 0857   PLT 484 (H) 05/31/2023 0940   PLT 562 (H) 05/22/2021 0857   MCV 87.0 05/31/2023 0940   MCV 84 05/22/2021 0857   MCH 27.8 05/31/2023 0940   MCHC 31.9 05/31/2023 0940   RDW 12.5 05/31/2023 0940   RDW 12.7 05/22/2021 0857   LYMPHSABS 2.1 05/31/2023 0940   MONOABS 0.6 05/31/2023 0940   EOSABS 0.0 05/31/2023 0940   BASOSABS 0.0 05/31/2023 0940      Latest Ref Rng & Units 01/21/2023    9:37 AM 05/26/2022   10:04 AM 05/22/2021    8:57 AM  CMP   Glucose 70 - 99 mg/dL 98  89  89   BUN 6 - 24 mg/dL 12  9  12    Creatinine 0.57 - 1.00 mg/dL 5.78  4.69  6.29   Sodium 134 -  144 mmol/L 139  137  140   Potassium 3.5 - 5.2 mmol/L 3.8  3.8  4.3   Chloride 96 - 106 mmol/L 95  95  97   CO2 20 - 29 mmol/L 27  26  27    Calcium 8.7 - 10.2 mg/dL 9.8  01.0  27.2   Total Protein 6.0 - 8.5 g/dL  8.0  8.1   Total Bilirubin 0.0 - 1.2 mg/dL  0.3  0.3   Alkaline Phos 44 - 121 IU/L  89  90   AST 0 - 40 IU/L  9  9   ALT 0 - 32 IU/L  14  11     DIAGNOSTIC IMAGING:  I have independently reviewed the relevant imaging and discussed with the patient.   WRAP UP:  All questions were answered. The patient knows to call the clinic with any problems, questions or concerns.  Medical decision making: Low***  Time spent on visit: I spent 15 minutes counseling the patient face to face. The total time spent in the appointment was 22 minutes and more than 50% was on counseling.  Carnella Guadalajara, PA-C  ***

## 2023-06-02 ENCOUNTER — Inpatient Hospital Stay: Payer: BC Managed Care – PPO | Admitting: Physician Assistant

## 2023-06-02 VITALS — BP 130/94 | HR 127 | Temp 98.9°F | Resp 20 | Wt 225.1 lb

## 2023-06-02 DIAGNOSIS — Z79899 Other long term (current) drug therapy: Secondary | ICD-10-CM | POA: Diagnosis not present

## 2023-06-02 DIAGNOSIS — R5383 Other fatigue: Secondary | ICD-10-CM | POA: Diagnosis not present

## 2023-06-02 DIAGNOSIS — K5900 Constipation, unspecified: Secondary | ICD-10-CM | POA: Diagnosis not present

## 2023-06-02 DIAGNOSIS — I10 Essential (primary) hypertension: Secondary | ICD-10-CM | POA: Diagnosis not present

## 2023-06-02 DIAGNOSIS — Z800 Family history of malignant neoplasm of digestive organs: Secondary | ICD-10-CM | POA: Diagnosis not present

## 2023-06-02 DIAGNOSIS — D500 Iron deficiency anemia secondary to blood loss (chronic): Secondary | ICD-10-CM

## 2023-06-02 DIAGNOSIS — K648 Other hemorrhoids: Secondary | ICD-10-CM | POA: Diagnosis not present

## 2023-06-02 DIAGNOSIS — Z8249 Family history of ischemic heart disease and other diseases of the circulatory system: Secondary | ICD-10-CM | POA: Diagnosis not present

## 2023-06-02 DIAGNOSIS — D75839 Thrombocytosis, unspecified: Secondary | ICD-10-CM

## 2023-06-02 DIAGNOSIS — D75838 Other thrombocytosis: Secondary | ICD-10-CM | POA: Diagnosis not present

## 2023-06-02 DIAGNOSIS — K5730 Diverticulosis of large intestine without perforation or abscess without bleeding: Secondary | ICD-10-CM | POA: Diagnosis not present

## 2023-06-02 DIAGNOSIS — K317 Polyp of stomach and duodenum: Secondary | ICD-10-CM | POA: Diagnosis not present

## 2023-06-02 DIAGNOSIS — D509 Iron deficiency anemia, unspecified: Secondary | ICD-10-CM | POA: Diagnosis not present

## 2023-06-02 DIAGNOSIS — Z833 Family history of diabetes mellitus: Secondary | ICD-10-CM | POA: Diagnosis not present

## 2023-06-02 DIAGNOSIS — E669 Obesity, unspecified: Secondary | ICD-10-CM | POA: Diagnosis not present

## 2023-06-02 DIAGNOSIS — K921 Melena: Secondary | ICD-10-CM | POA: Diagnosis not present

## 2023-06-02 NOTE — Patient Instructions (Addendum)
[facility] at [facility] Discharge Instructions  You were seen today by Rojelio Brenner PA-C for your elevated platelets and your iron deficiency.  IRON DEFICIENCY: Continue to take your iron pill every day.  It is best absorbed when taken in the morning with a glass of orange juice.    ELEVATED PLATELETS: This may be related to your low iron, and also may be related to inflammation from obesity.  We will continue to monitor these at follow-up appointments.  FOLLOW-UP APPOINTMENT: Since your labs have overall been stable, we will plan to follow-up in 1 year (instead of every 6 months).  However, please do not hesitate to reach out sooner if you have any new symptoms, lab changes, or other concerns that need to be addressed before that time!  ** Thank you for trusting me with your healthcare!  I strive to provide all of my patients with quality care at each visit.  If you receive a survey for this visit, I would be so grateful to you for taking the time to provide feedback.  Thank you in advance!  ~ Jamontae Thwaites                   Dr. Doreatha Massed   &   Rojelio Brenner, PA-C   - - - - - - - - - - - - - - - - - -     Thank you for choosing [facility] at [facility] to provide your oncology and hematology care.  To afford each patient quality time with our provider, please arrive at least 15 minutes before your scheduled appointment time.   If you have a lab appointment with the Cancer Center please come in thru the Main Entrance and check in at the main information desk.  You need to re-schedule your appointment should you arrive 10 or more minutes late.  We strive to give you quality time with our providers, and arriving late affects you and other patients whose appointments are after yours.  Also, if you no show three or more times for appointments you may be dismissed from the clinic at the providers discretion.     Again, thank you for  choosing [facility].  Our hope is that these requests will decrease the amount of time that you wait before being seen by our physicians.       _____________________________________________________________  Should you have questions after your visit to [facility], please contact our office at [phone] and follow the prompts.  Our office hours are 8:00 a.m. and 4:30 p.m. Monday - Friday.  Please note that voicemails left after 4:00 p.m. may not be returned until the following business day.  We are closed weekends and major holidays.  You do have access to a nurse 24-7, just call the main number to the clinic [phone] and do not press any options, hold on the line and a nurse will answer the phone.    For prescription refill requests, have your pharmacy contact our office and allow 72 hours.    Due to Covid, you will need to wear a mask upon entering the hospital. If you do not have a mask, a mask will be given to you at the Main Entrance upon arrival. For doctor visits, patients may have 1 support person age 6 or older with them. For treatment visits, patients can not have anyone with them due to social distancing  guidelines and our immunocompromised population.

## 2023-06-08 ENCOUNTER — Ambulatory Visit (HOSPITAL_COMMUNITY)
Admission: RE | Admit: 2023-06-08 | Discharge: 2023-06-08 | Disposition: A | Discharge status: Home / Self Care | Payer: BC Managed Care – PPO | Source: Ambulatory Visit | Attending: Family Medicine | Admitting: Family Medicine

## 2023-06-08 ENCOUNTER — Encounter (HOSPITAL_COMMUNITY): Payer: Self-pay

## 2023-06-08 DIAGNOSIS — R928 Other abnormal and inconclusive findings on diagnostic imaging of breast: Secondary | ICD-10-CM

## 2023-06-08 DIAGNOSIS — R92322 Mammographic fibroglandular density, left breast: Secondary | ICD-10-CM | POA: Diagnosis not present

## 2023-07-02 ENCOUNTER — Encounter: Payer: Self-pay | Admitting: Family Medicine

## 2023-07-02 ENCOUNTER — Ambulatory Visit (INDEPENDENT_AMBULATORY_CARE_PROVIDER_SITE_OTHER): Payer: BC Managed Care – PPO | Admitting: Family Medicine

## 2023-07-02 VITALS — BP 123/76 | HR 104 | Resp 16 | Ht 64.0 in | Wt 225.0 lb

## 2023-07-02 DIAGNOSIS — E559 Vitamin D deficiency, unspecified: Secondary | ICD-10-CM | POA: Diagnosis not present

## 2023-07-02 DIAGNOSIS — R7303 Prediabetes: Secondary | ICD-10-CM | POA: Diagnosis not present

## 2023-07-02 DIAGNOSIS — K648 Other hemorrhoids: Secondary | ICD-10-CM

## 2023-07-02 DIAGNOSIS — K5730 Diverticulosis of large intestine without perforation or abscess without bleeding: Secondary | ICD-10-CM | POA: Insufficient documentation

## 2023-07-02 DIAGNOSIS — I10 Essential (primary) hypertension: Secondary | ICD-10-CM | POA: Diagnosis not present

## 2023-07-02 DIAGNOSIS — Z0000 Encounter for general adult medical examination without abnormal findings: Secondary | ICD-10-CM | POA: Diagnosis not present

## 2023-07-02 DIAGNOSIS — Z13220 Encounter for screening for lipoid disorders: Secondary | ICD-10-CM

## 2023-07-02 NOTE — Patient Instructions (Signed)
 F/U in 6 months, call if you need me sooner  Keep up healthy lifestyle, 10 pound weight loss in 6 months is our set goal, and CAN be achieved   Lipid, cmp and EgFR, TSH, hBA1C and vit d today, I will message you with result  Information on diverticulosis is provided

## 2023-07-02 NOTE — Assessment & Plan Note (Signed)
  Patient re-educated about  the importance of commitment to a  minimum of 150 minutes of exercise per week as able.  The importance of healthy food choices with portion control discussed, as well as eating regularly and within a 12 hour window most days. The need to choose "clean , green" food 50 to 75% of the time is discussed, as well as to make water the primary drink and set a goal of 64 ounces water daily.       07/02/2023    3:36 PM 06/02/2023   12:54 PM 01/21/2023    8:52 AM  Weight /BMI  Weight 225 lb 225 lb 1.4 oz 231 lb 1.3 oz  Height 5\' 4"  (1.626 m)  5\' 4"  (1.626 m)  BMI 38.62 kg/m2 38.64 kg/m2 39.66 kg/m2    Improving , lifestyle change only, great

## 2023-07-02 NOTE — Assessment & Plan Note (Signed)
 Updated lab needed at/ before next visit.

## 2023-07-02 NOTE — Assessment & Plan Note (Signed)
 Patient educated about the importance of limiting  Carbohydrate intake , the need to commit to daily physical activity for a minimum of 30 minutes , and to commit weight loss. The fact that changes in all these areas will reduce or eliminate all together the development of diabetes is stressed.      Latest Ref Rng & Units 01/21/2023    9:37 AM 05/26/2022   10:04 AM 07/25/2021   10:13 AM 05/22/2021    8:57 AM 04/02/2021    2:41 PM  Diabetic Labs  HbA1c 4.8 - 5.6 % 6.0  5.7  5.9     Chol 100 - 199 mg/dL  846   962    HDL >95 mg/dL  57   61    Calc LDL 0 - 99 mg/dL  99   87    Triglycerides 0 - 149 mg/dL  66   45    Creatinine 0.57 - 1.00 mg/dL 2.84  1.32   4.40  1.02       07/02/2023    3:36 PM 06/02/2023   12:54 PM 01/21/2023    8:52 AM 11/25/2022    1:03 PM 09/24/2022    8:52 AM 09/24/2022    8:26 AM 05/26/2022    9:05 AM  BP/Weight  Systolic BP 123 130 118 137 130 131 122  Diastolic BP 76 94 83 83 88 92 86  Wt. (Lbs) 225 225.09 231.08 226.6  231.08 233  BMI 38.62 kg/m2 38.64 kg/m2 39.66 kg/m2 38.9 kg/m2  39.66 kg/m2 39.99 kg/m2       No data to display          Updated lab needed at/ before next visit.

## 2023-07-03 ENCOUNTER — Encounter: Payer: Self-pay | Admitting: Family Medicine

## 2023-07-03 DIAGNOSIS — Z0000 Encounter for general adult medical examination without abnormal findings: Secondary | ICD-10-CM | POA: Insufficient documentation

## 2023-07-03 LAB — CMP14+EGFR
ALT: 8 IU/L (ref 0–32)
AST: 12 IU/L (ref 0–40)
Albumin: 4.1 g/dL (ref 3.9–4.9)
Alkaline Phosphatase: 83 IU/L (ref 44–121)
BUN/Creatinine Ratio: 9 (ref 9–23)
BUN: 8 mg/dL (ref 6–24)
Bilirubin Total: 0.2 mg/dL (ref 0.0–1.2)
CO2: 26 mmol/L (ref 20–29)
Calcium: 9.9 mg/dL (ref 8.7–10.2)
Chloride: 95 mmol/L — ABNORMAL LOW (ref 96–106)
Creatinine, Ser: 0.87 mg/dL (ref 0.57–1.00)
Globulin, Total: 3.6 g/dL (ref 1.5–4.5)
Glucose: 102 mg/dL — ABNORMAL HIGH (ref 70–99)
Potassium: 3.5 mmol/L (ref 3.5–5.2)
Sodium: 138 mmol/L (ref 134–144)
Total Protein: 7.7 g/dL (ref 6.0–8.5)
eGFR: 84 mL/min/{1.73_m2} (ref >59)

## 2023-07-03 LAB — LIPID PANEL
Chol/HDL Ratio: 2.6 ratio (ref 0.0–4.4)
Cholesterol, Total: 165 mg/dL (ref 100–199)
HDL: 63 mg/dL (ref >39)
LDL Chol Calc (NIH): 89 mg/dL (ref 0–99)
Triglycerides: 64 mg/dL (ref 0–149)
VLDL Cholesterol Cal: 13 mg/dL (ref 5–40)

## 2023-07-03 LAB — HEMOGLOBIN A1C
Est. average glucose Bld gHb Est-mCnc: 123 mg/dL
Hgb A1c MFr Bld: 5.9 % — ABNORMAL HIGH (ref 4.8–5.6)

## 2023-07-03 LAB — TSH: TSH: 1.62 u[IU]/mL (ref 0.450–4.500)

## 2023-07-03 LAB — VITAMIN D 25 HYDROXY (VIT D DEFICIENCY, FRACTURES): Vit D, 25-Hydroxy: 32.4 ng/mL (ref 30.0–100.0)

## 2023-07-03 NOTE — Assessment & Plan Note (Signed)
 Annual exam as documented. Counseling done  re healthy lifestyle involving commitment to 150 minutes exercise per week, heart healthy diet, and attaining healthy weight.The importance of adequate sleep also discussed.  Immunization and cancer screening needs are specifically addressed at this visit.

## 2023-07-03 NOTE — Progress Notes (Signed)
 RYELEE Mcguire     MRN: [id number]      DOB: 28-Aug-1975  Chief Complaint  Patient presents with   Annual Exam    HPI: Patient is in for annual physical exam. No other health concerns are expressed or addressed at the visit.  PE: BP 123/76   Pulse (!) 104   Resp 16   Ht 5\' 4"  (1.626 m)   Wt 225 lb (102.1 kg)   SpO2 96%   BMI 38.62 kg/m   Pleasant  female, alert and oriented x 3, in no cardio-pulmonary distress. Afebrile. HEENT No facial trauma or asymetry. Sinuses non tender.  Extra occullar muscles intact.. External ears normal, . Neck: supple, no adenopathy,JVD or thyromegaly.No bruits.  Chest: Clear to ascultation bilaterally.No crackles or wheezes. Non tender to palpation  Breast: No asymetry,no masses or lumps. No tenderness. No nipple discharge or inversion. No axillary or supraclavicular adenopathy  Cardiovascular system; Heart sounds normal,  S1 and  S2 ,no S3.  No murmur, or thrill. Apical beat not displaced Peripheral pulses normal.  Abdomen: Soft, non tender, no organomegaly or masses. No bruits. Bowel sounds normal. No guarding, tenderness or rebound.     Musculoskeletal exam: Full ROM of spine, hips , shoulders and knees. No deformity ,swelling or crepitus noted. No muscle wasting or atrophy.   Neurologic: Cranial nerves 2 to 12 intact. Power, tone ,sensation and reflexes normal throughout. No disturbance in gait. No tremor.  Skin: Intact, no ulceration, erythema , scaling or rash noted. Pigmentation normal throughout  Psych; Normal mood and affect. Judgement and concentration normal   Assessment & Plan:  Morbid obesity  Patient re-educated about  the importance of commitment to a  minimum of 150 minutes of exercise per week as able.  The importance of healthy food choices with portion control discussed, as well as eating regularly and within a 12 hour window most days. The need to choose "clean , green" food 50 to 75% of  the time is discussed, as well as to make water the primary drink and set a goal of 64 ounces water daily.       07/02/2023    3:36 PM 06/02/2023   12:54 PM 01/21/2023    8:52 AM  Weight /BMI  Weight 225 lb 225 lb 1.4 oz 231 lb 1.3 oz  Height 5\' 4"  (1.626 m)  5\' 4"  (1.626 m)  BMI 38.62 kg/m2 38.64 kg/m2 39.66 kg/m2    Improving , lifestyle change only, great  Prediabetes Patient educated about the importance of limiting  Carbohydrate intake , the need to commit to daily physical activity for a minimum of 30 minutes , and to commit weight loss. The fact that changes in all these areas will reduce or eliminate all together the development of diabetes is stressed.      Latest Ref Rng & Units 01/21/2023    9:37 AM 05/26/2022   10:04 AM 07/25/2021   10:13 AM 05/22/2021    8:57 AM 04/02/2021    2:41 PM  Diabetic Labs  HbA1c 4.8 - 5.6 % 6.0  5.7  5.9     Chol 100 - 199 mg/dL  409   811    HDL >91 mg/dL  57   61    Calc LDL 0 - 99 mg/dL  99   87    Triglycerides 0 - 149 mg/dL  66   45    Creatinine 0.57 - 1.00 mg/dL 4.78  2.95  1.03  0.94       07/02/2023    3:36 PM 06/02/2023   12:54 PM 01/21/2023    8:52 AM 11/25/2022    1:03 PM 09/24/2022    8:52 AM 09/24/2022    8:26 AM 05/26/2022    9:05 AM  BP/Weight  Systolic BP 123 130 118 137 130 131 122  Diastolic BP 76 94 83 83 88 92 86  Wt. (Lbs) 225 225.09 231.08 226.6  231.08 233  BMI 38.62 kg/m2 38.64 kg/m2 39.66 kg/m2 38.9 kg/m2  39.66 kg/m2 39.99 kg/m2       No data to display          Updated lab needed at/ before next visit.   Vitamin D deficiency Updated lab needed at/ before next visit.   Encounter for annual physical exam Annual exam as documented. Counseling done  re healthy lifestyle involving commitment to 150 minutes exercise per week, heart healthy diet, and attaining healthy weight.The importance of adequate sleep also discussed. Immunization and cancer screening needs are specifically addressed at this  visit.

## 2023-07-16 ENCOUNTER — Other Ambulatory Visit: Payer: Self-pay | Admitting: Family Medicine

## 2023-09-09 ENCOUNTER — Other Ambulatory Visit: Payer: Self-pay | Admitting: Family Medicine

## 2023-11-01 ENCOUNTER — Other Ambulatory Visit: Payer: Self-pay | Admitting: Family Medicine

## 2023-12-15 ENCOUNTER — Other Ambulatory Visit: Payer: Self-pay | Admitting: Family Medicine

## 2023-12-31 ENCOUNTER — Encounter: Payer: Self-pay | Admitting: Family Medicine

## 2023-12-31 ENCOUNTER — Ambulatory Visit: Payer: BC Managed Care – PPO | Admitting: Family Medicine

## 2023-12-31 ENCOUNTER — Other Ambulatory Visit (HOSPITAL_COMMUNITY): Payer: Self-pay | Admitting: Family Medicine

## 2023-12-31 VITALS — BP 130/84 | HR 102 | Resp 18 | Ht 64.0 in | Wt 242.0 lb

## 2023-12-31 DIAGNOSIS — Z23 Encounter for immunization: Secondary | ICD-10-CM

## 2023-12-31 DIAGNOSIS — I10 Essential (primary) hypertension: Secondary | ICD-10-CM

## 2023-12-31 DIAGNOSIS — Z1231 Encounter for screening mammogram for malignant neoplasm of breast: Secondary | ICD-10-CM

## 2023-12-31 MED ORDER — TRIAMTERENE-HCTZ 75-50 MG PO TABS: 1.0000 | ORAL_TABLET | Freq: Every day | ORAL | 3 refills | Status: AC

## 2023-12-31 NOTE — Patient Instructions (Addendum)
 Annual exam in Feb, 2026  HPV #1 today  HPV#2 and flu vaccine in 2 months  HPV #3 in 6 months  Pls schedule mammogram at checkout  BMP and eGFR next week please when getting additional labs for weight loss clinic  It is important that you exercise regularly at least 30 minutes 5 times a week. If you develop chest pain, have severe difficulty breathing, or feel very tired, stop exercising immediately and seek medical attention   Think about what you will eat, plan ahead. Choose  clean, green, fresh or frozen over canned, processed or packaged foods which are more sugary, salty and fatty. 70 to 75% of food eaten should be vegetables and fruit. Three meals at set times with snacks allowed between meals, but they must be fruit or vegetables. Aim to eat over a 12 hour period , example 7 am to 7 pm, and STOP after  your last meal of the day. Drink water ,generally about 64 ounces per day, no other drink is as healthy. Fruit juice is best enjoyed in a healthy way, by EATING the fruit.  SUCCESS WILL BE YOURS ON YOUR  WEIGHT LOSS JOURNEY!!

## 2024-01-02 ENCOUNTER — Encounter: Payer: Self-pay | Admitting: Family Medicine

## 2024-01-02 DIAGNOSIS — Z23 Encounter for immunization: Secondary | ICD-10-CM | POA: Insufficient documentation

## 2024-01-02 NOTE — Progress Notes (Signed)
 Rose Mcguire     MRN: [id number]      DOB: [birth year]  Chief Complaint  Patient presents with   Hypertension    6 month follow up     HPI [name] is here for follow up and re-evaluation of chronic medical conditions, medication management and review of any available recent lab and radiology data.  Preventive health is updated, specifically  Cancer screening and Immunization.   Recently signed up for a weight loss clinic and wants to be sure this ios OK, has regained a lot of weight off wegovy . The PT denies any adverse reactions to current medications since the last visit.  There are no new concerns.  There are no specific complaints   ROS Denies recent fever or chills. Denies sinus pressure, nasal congestion, ear pain or sore throat. Denies chest congestion, productive cough or wheezing. Denies chest pains, palpitations and leg swelling Denies abdominal pain, nausea, vomiting,diarrhea or constipation.   Denies dysuria, frequency, hesitancy or incontinence. Denies joint pain, swelling and limitation in mobility. Denies headaches, seizures, numbness, or tingling. Denies depression, anxiety or insomnia. Denies skin break down or rash.   PE  BP 130/84   Pulse (!) 102   Resp 18   Ht 5' 4 (1.626 m)   Wt 242 lb 0.6 oz (109.8 kg)   LMP 11/14/2023 (Approximate)   SpO2 97%   BMI 41.55 kg/m   Patient alert and oriented and in no cardiopulmonary distress.  HEENT: No facial asymmetry, EOMI,     Neck supple .  Chest: Clear to auscultation bilaterally.  CVS: S1, S2 no murmurs, no S3.Regular rate.  ABD: Soft non tender.   Ext: No edema  MS: Adequate ROM spine, shoulders, hips and knees.  Skin: Intact, no ulcerations or rash noted.  Psych: Good eye contact, normal affect. Memory intact not anxious or depressed appearing.  CNS: CN 2-12 intact, power,  normal throughout.no focal deficits noted.   Assessment & Plan  Essential hypertension Controlled, no  change in medication DASH diet and commitment to daily physical activity for a minimum of 30 minutes discussed and encouraged, as a part of hypertension management. The importance of attaining a healthy weight is also discussed.     12/31/2023    4:14 PM 12/31/2023    3:54 PM 07/02/2023    3:36 PM 06/02/2023   12:54 PM 01/21/2023    8:52 AM 11/25/2022    1:03 PM 09/24/2022    8:52 AM  BP/Weight  Systolic BP 130 130 123 130 118 137 130  Diastolic BP 84 84 76 94 83 83 88  Wt. (Lbs)  242.04 225 225.09 231.08 226.6   BMI  41.55 kg/m2 38.62 kg/m2 38.64 kg/m2 39.66 kg/m2 38.9 kg/m2        Morbid obesity Deteriorated, is joining weight loss clinic  Patient re-educated about  the importance of commitment to a  minimum of 150 minutes of exercise per week as able.  The importance of healthy food choices with portion control discussed, as well as eating regularly and within a 12 hour window most days. The need to choose clean , green food 50 to 75% of the time is discussed, as well as to make water  the primary drink and set a goal of 64 ounces water  daily.       12/31/2023    3:54 PM 07/02/2023    3:36 PM 06/02/2023   12:54 PM  Weight /BMI  Weight 242 lb 0.6 oz 225  lb 225 lb 1.4 oz  Height 5' 4 (1.626 m) 5' 4 (1.626 m)   BMI 41.55 kg/m2 38.62 kg/m2 38.64 kg/m2      Need for HPV vaccine After obtaining informed consent, the hPV #1 vaccine is  administered , with no adverse effect noted at the time of administration.

## 2024-01-02 NOTE — Assessment & Plan Note (Signed)
 After obtaining informed consent, the hPV #1 vaccine is  administered , with no adverse effect noted at the time of administration.

## 2024-01-02 NOTE — Assessment & Plan Note (Signed)
 Controlled, no change in medication DASH diet and commitment to daily physical activity for a minimum of 30 minutes discussed and encouraged, as a part of hypertension management. The importance of attaining a healthy weight is also discussed.     12/31/2023    4:14 PM 12/31/2023    3:54 PM 07/02/2023    3:36 PM 06/02/2023   12:54 PM 01/21/2023    8:52 AM 11/25/2022    1:03 PM 09/24/2022    8:52 AM  BP/Weight  Systolic BP 130 130 123 130 118 137 130  Diastolic BP 84 84 76 94 83 83 88  Wt. (Lbs)  242.04 225 225.09 231.08 226.6   BMI  41.55 kg/m2 38.62 kg/m2 38.64 kg/m2 39.66 kg/m2 38.9 kg/m2

## 2024-01-02 NOTE — Assessment & Plan Note (Signed)
 Deteriorated, is joining weight loss clinic  Patient re-educated about  the importance of commitment to a  minimum of 150 minutes of exercise per week as able.  The importance of healthy food choices with portion control discussed, as well as eating regularly and within a 12 hour window most days. The need to choose clean , green food 50 to 75% of the time is discussed, as well as to make water  the primary drink and set a goal of 64 ounces water  daily.       12/31/2023    3:54 PM 07/02/2023    3:36 PM 06/02/2023   12:54 PM  Weight /BMI  Weight 242 lb 0.6 oz 225 lb 225 lb 1.4 oz  Height 5' 4 (1.626 m) 5' 4 (1.626 m)   BMI 41.55 kg/m2 38.62 kg/m2 38.64 kg/m2

## 2024-01-05 DIAGNOSIS — I10 Essential (primary) hypertension: Secondary | ICD-10-CM | POA: Diagnosis not present

## 2024-01-06 ENCOUNTER — Ambulatory Visit: Payer: Self-pay | Admitting: Family Medicine

## 2024-01-06 LAB — BMP8+EGFR
BUN/Creatinine Ratio: 12 (ref 9–23)
BUN: 11 mg/dL (ref 6–24)
CO2: 25 mmol/L (ref 20–29)
Calcium: 9.9 mg/dL (ref 8.7–10.2)
Chloride: 97 mmol/L (ref 96–106)
Creatinine, Ser: 0.94 mg/dL (ref 0.57–1.00)
Glucose: 145 mg/dL — ABNORMAL HIGH (ref 70–99)
Potassium: 3.8 mmol/L (ref 3.5–5.2)
Sodium: 140 mmol/L (ref 134–144)
eGFR: 76 mL/min/1.73 (ref >59)

## 2024-03-03 ENCOUNTER — Ambulatory Visit (INDEPENDENT_AMBULATORY_CARE_PROVIDER_SITE_OTHER)

## 2024-03-03 DIAGNOSIS — Z23 Encounter for immunization: Secondary | ICD-10-CM | POA: Diagnosis not present

## 2024-03-03 NOTE — Progress Notes (Signed)
 Patient is in office today for a nurse visit for Immunization. Patient Injection was given in the  Right deltoid. Patient tolerated injection well.

## 2024-05-26 ENCOUNTER — Inpatient Hospital Stay

## 2024-05-30 ENCOUNTER — Inpatient Hospital Stay: Payer: BC Managed Care – PPO

## 2024-05-30 ENCOUNTER — Telehealth: Payer: Self-pay

## 2024-05-30 NOTE — Telephone Encounter (Signed)
 Copied from CRM #[id number]. Topic: Clinical - Medication Refill >> May 30, 2024  9:24 AM Lonell PEDLAR wrote: Medication: triamterene -hydrochlorothiazide (MAXZIDE) 75-50 MG tablet  Has the patient contacted their pharmacy? Yes, advised she needed an auth (Agent: If no, request that the patient contact the pharmacy for the refill. If patient does not wish to contact the pharmacy document the reason why and proceed with request.) (Agent: If yes, when and what did the pharmacy advise?)  This is the patient's preferred pharmacy:  [facility] [address] [place] - [address] [place] [address] Phone: [phone] Fax: [phone]  Is this the correct pharmacy for this prescription? Yes If no, delete pharmacy and type the correct one.   Has the prescription been filled recently? Yes  Is the patient out of the medication? Yes  Has the patient been seen for an appointment in the last year OR does the patient have an upcoming appointment? Yes  Can we respond through MyChart? Yes  Agent: Please be advised that Rx refills may take up to 3 business days. We ask that you follow-up with your pharmacy.

## 2024-05-30 NOTE — Telephone Encounter (Signed)
 Refills at pharmacy.

## 2024-06-01 ENCOUNTER — Inpatient Hospital Stay: Attending: Physician Assistant

## 2024-06-01 DIAGNOSIS — K5900 Constipation, unspecified: Secondary | ICD-10-CM | POA: Insufficient documentation

## 2024-06-01 DIAGNOSIS — D500 Iron deficiency anemia secondary to blood loss (chronic): Secondary | ICD-10-CM

## 2024-06-01 DIAGNOSIS — E611 Iron deficiency: Secondary | ICD-10-CM | POA: Insufficient documentation

## 2024-06-01 DIAGNOSIS — D75839 Thrombocytosis, unspecified: Secondary | ICD-10-CM | POA: Diagnosis present

## 2024-06-01 DIAGNOSIS — Z79899 Other long term (current) drug therapy: Secondary | ICD-10-CM | POA: Insufficient documentation

## 2024-06-01 LAB — C-REACTIVE PROTEIN: CRP: 2.4 mg/dL — ABNORMAL HIGH

## 2024-06-01 LAB — CBC WITH DIFFERENTIAL/PLATELET
Abs Immature Granulocytes: 0.03 10*3/uL (ref 0.00–0.07)
Basophils Absolute: 0 10*3/uL (ref 0.0–0.1)
Basophils Relative: 1 %
Eosinophils Absolute: 0.1 10*3/uL (ref 0.0–0.5)
Eosinophils Relative: 1 %
HCT: 39.6 % (ref 36.0–46.0)
Hemoglobin: 12.4 g/dL (ref 12.0–15.0)
Immature Granulocytes: 0 %
Lymphocytes Relative: 30 %
Lymphs Abs: 2.3 10*3/uL (ref 0.7–4.0)
MCH: 27.6 pg (ref 26.0–34.0)
MCHC: 31.3 g/dL (ref 30.0–36.0)
MCV: 88.2 fL (ref 80.0–100.0)
Monocytes Absolute: 0.5 10*3/uL (ref 0.1–1.0)
Monocytes Relative: 7 %
Neutro Abs: 4.9 10*3/uL (ref 1.7–7.7)
Neutrophils Relative %: 61 %
Platelets: 488 10*3/uL — ABNORMAL HIGH (ref 150–400)
RBC: 4.49 MIL/uL (ref 3.87–5.11)
RDW: 13.4 % (ref 11.5–15.5)
WBC: 7.9 10*3/uL (ref 4.0–10.5)
nRBC: 0 % (ref 0.0–0.2)

## 2024-06-01 LAB — IRON AND TIBC
Iron: 37 ug/dL (ref 28–170)
Saturation Ratios: 12 % (ref 10.4–31.8)
TIBC: 307 ug/dL (ref 250–450)
UIBC: 269 ug/dL

## 2024-06-01 LAB — SEDIMENTATION RATE: Sed Rate: 58 mm/h — ABNORMAL HIGH (ref 0–20)

## 2024-06-01 LAB — FERRITIN: Ferritin: 62 ng/mL (ref 11–307)

## 2024-06-05 ENCOUNTER — Ambulatory Visit (HOSPITAL_COMMUNITY)

## 2024-06-06 ENCOUNTER — Inpatient Hospital Stay: Payer: BC Managed Care – PPO | Admitting: Physician Assistant

## 2024-06-06 DIAGNOSIS — E611 Iron deficiency: Secondary | ICD-10-CM

## 2024-06-06 DIAGNOSIS — D75839 Thrombocytosis, unspecified: Secondary | ICD-10-CM

## 2024-06-06 DIAGNOSIS — D500 Iron deficiency anemia secondary to blood loss (chronic): Secondary | ICD-10-CM

## 2024-06-07 ENCOUNTER — Inpatient Hospital Stay (HOSPITAL_COMMUNITY): Admission: RE | Admit: 2024-06-07 | Discharge: 2024-06-07 | Attending: Family Medicine | Admitting: Family Medicine

## 2024-06-07 ENCOUNTER — Encounter (HOSPITAL_COMMUNITY): Payer: Self-pay

## 2024-06-07 DIAGNOSIS — Z1231 Encounter for screening mammogram for malignant neoplasm of breast: Secondary | ICD-10-CM

## 2024-07-03 ENCOUNTER — Ambulatory Visit

## 2024-07-07 ENCOUNTER — Ambulatory Visit
